# Patient Record
Sex: Female | Born: 1965
Health system: Southern US, Community
[De-identification: ages and names within clinical notes are randomized; demographics above are authoritative.]

## PROBLEM LIST (undated history)

## (undated) DIAGNOSIS — H409 Unspecified glaucoma: Secondary | ICD-10-CM

## (undated) DIAGNOSIS — E559 Vitamin D deficiency, unspecified: Secondary | ICD-10-CM

## (undated) DIAGNOSIS — Z9109 Other allergy status, other than to drugs and biological substances: Secondary | ICD-10-CM

## (undated) DIAGNOSIS — K219 Gastro-esophageal reflux disease without esophagitis: Secondary | ICD-10-CM

## (undated) DIAGNOSIS — J302 Other seasonal allergic rhinitis: Secondary | ICD-10-CM

## (undated) DIAGNOSIS — B019 Varicella without complication: Secondary | ICD-10-CM

## (undated) HISTORY — DX: Varicella without complication: B01.9

## (undated) HISTORY — PX: WISDOM TOOTH EXTRACTION: SHX21

## (undated) HISTORY — DX: Other allergy status, other than to drugs and biological substances: Z91.09

## (undated) HISTORY — DX: Unspecified glaucoma: H40.9

## (undated) HISTORY — PX: BREAST BIOPSY: SHX20

## (undated) HISTORY — DX: Other seasonal allergic rhinitis: J30.2

## (undated) HISTORY — DX: Vitamin D deficiency, unspecified: E55.9

---

## 2010-10-08 ENCOUNTER — Emergency Department (HOSPITAL_BASED_OUTPATIENT_CLINIC_OR_DEPARTMENT_OTHER)
Admission: EM | Admit: 2010-10-08 | Discharge: 2010-10-08 | Disposition: A | Discharge status: Home / Self Care | Payer: No Typology Code available for payment source | Attending: Emergency Medicine | Admitting: Emergency Medicine

## 2010-10-08 DIAGNOSIS — Y92410 Unspecified street and highway as the place of occurrence of the external cause: Secondary | ICD-10-CM | POA: Insufficient documentation

## 2010-10-08 DIAGNOSIS — IMO0001 Reserved for inherently not codable concepts without codable children: Secondary | ICD-10-CM | POA: Insufficient documentation

## 2010-10-14 ENCOUNTER — Emergency Department (HOSPITAL_BASED_OUTPATIENT_CLINIC_OR_DEPARTMENT_OTHER)
Admission: EM | Admit: 2010-10-14 | Discharge: 2010-10-14 | Disposition: A | Discharge status: Home / Self Care | Payer: No Typology Code available for payment source | Attending: Emergency Medicine | Admitting: Emergency Medicine

## 2010-10-14 DIAGNOSIS — G44209 Tension-type headache, unspecified, not intractable: Secondary | ICD-10-CM | POA: Insufficient documentation

## 2010-11-02 ENCOUNTER — Ambulatory Visit: Payer: No Typology Code available for payment source | Attending: Family Medicine | Admitting: Physical Therapy

## 2010-11-02 DIAGNOSIS — M545 Low back pain, unspecified: Secondary | ICD-10-CM | POA: Insufficient documentation

## 2010-11-02 DIAGNOSIS — M542 Cervicalgia: Secondary | ICD-10-CM | POA: Insufficient documentation

## 2010-11-02 DIAGNOSIS — IMO0001 Reserved for inherently not codable concepts without codable children: Secondary | ICD-10-CM | POA: Insufficient documentation

## 2010-11-02 DIAGNOSIS — M2569 Stiffness of other specified joint, not elsewhere classified: Secondary | ICD-10-CM | POA: Insufficient documentation

## 2010-11-07 ENCOUNTER — Ambulatory Visit: Payer: No Typology Code available for payment source | Attending: Family Medicine | Admitting: Physical Therapy

## 2010-11-07 DIAGNOSIS — IMO0001 Reserved for inherently not codable concepts without codable children: Secondary | ICD-10-CM | POA: Insufficient documentation

## 2010-11-07 DIAGNOSIS — M545 Low back pain, unspecified: Secondary | ICD-10-CM | POA: Insufficient documentation

## 2010-11-07 DIAGNOSIS — M542 Cervicalgia: Secondary | ICD-10-CM | POA: Insufficient documentation

## 2010-11-07 DIAGNOSIS — M2569 Stiffness of other specified joint, not elsewhere classified: Secondary | ICD-10-CM | POA: Insufficient documentation

## 2010-11-10 ENCOUNTER — Ambulatory Visit: Payer: No Typology Code available for payment source | Admitting: Physical Therapy

## 2010-11-16 ENCOUNTER — Ambulatory Visit: Payer: No Typology Code available for payment source | Admitting: Physical Therapy

## 2010-11-24 ENCOUNTER — Ambulatory Visit: Payer: No Typology Code available for payment source | Admitting: Physical Therapy

## 2011-05-19 ENCOUNTER — Emergency Department (HOSPITAL_BASED_OUTPATIENT_CLINIC_OR_DEPARTMENT_OTHER)
Admission: EM | Admit: 2011-05-19 | Discharge: 2011-05-19 | Disposition: A | Discharge status: Home / Self Care | Payer: BC Managed Care – PPO | Attending: Emergency Medicine | Admitting: Emergency Medicine

## 2011-05-19 ENCOUNTER — Other Ambulatory Visit: Payer: Self-pay

## 2011-05-19 ENCOUNTER — Emergency Department (INDEPENDENT_AMBULATORY_CARE_PROVIDER_SITE_OTHER): Payer: BC Managed Care – PPO

## 2011-05-19 ENCOUNTER — Emergency Department (HOSPITAL_BASED_OUTPATIENT_CLINIC_OR_DEPARTMENT_OTHER): Payer: BC Managed Care – PPO

## 2011-05-19 ENCOUNTER — Encounter: Payer: Self-pay | Admitting: *Deleted

## 2011-05-19 DIAGNOSIS — R12 Heartburn: Secondary | ICD-10-CM

## 2011-05-19 DIAGNOSIS — K219 Gastro-esophageal reflux disease without esophagitis: Secondary | ICD-10-CM | POA: Insufficient documentation

## 2011-05-19 DIAGNOSIS — R079 Chest pain, unspecified: Secondary | ICD-10-CM

## 2011-05-19 DIAGNOSIS — R112 Nausea with vomiting, unspecified: Secondary | ICD-10-CM | POA: Insufficient documentation

## 2011-05-19 HISTORY — DX: Gastro-esophageal reflux disease without esophagitis: K21.9

## 2011-05-19 LAB — COMPREHENSIVE METABOLIC PANEL
ALT: 12 U/L (ref 0–35)
AST: 21 U/L (ref 0–37)
Albumin: 4.2 g/dL (ref 3.5–5.2)
CO2: 30 mEq/L (ref 19–32)
Chloride: 100 mEq/L (ref 96–112)
Creatinine, Ser: 0.8 mg/dL (ref 0.50–1.10)
GFR calc non Af Amer: 88 mL/min — ABNORMAL LOW (ref >90)
Sodium: 139 mEq/L (ref 135–145)
Total Bilirubin: 0.8 mg/dL (ref 0.3–1.2)

## 2011-05-19 LAB — CBC
MCHC: 33.4 g/dL (ref 30.0–36.0)
Platelets: 344 10*3/uL (ref 150–400)
RDW: 13.4 % (ref 11.5–15.5)
WBC: 6.6 10*3/uL (ref 4.0–10.5)

## 2011-05-19 LAB — URINALYSIS, ROUTINE W REFLEX MICROSCOPIC
Glucose, UA: NEGATIVE mg/dL
Hgb urine dipstick: NEGATIVE
Leukocytes, UA: NEGATIVE
Protein, ur: NEGATIVE mg/dL
pH: 8 (ref 5.0–8.0)

## 2011-05-19 LAB — DIFFERENTIAL
Basophils Absolute: 0 10*3/uL (ref 0.0–0.1)
Basophils Relative: 1 % (ref 0–1)
Lymphocytes Relative: 30 % (ref 12–46)
Neutro Abs: 3.8 10*3/uL (ref 1.7–7.7)
Neutrophils Relative %: 57 % (ref 43–77)

## 2011-05-19 LAB — CK TOTAL AND CKMB (NOT AT ARMC)
CK, MB: 2.1 ng/mL (ref 0.3–4.0)
Relative Index: 1.2 (ref 0.0–2.5)

## 2011-05-19 LAB — TROPONIN I: Troponin I: <0.3 ng/mL (ref <0.30)

## 2011-05-19 MED ORDER — GI COCKTAIL ~~LOC~~
30.0000 mL | Freq: Once | ORAL | Status: AC
  Administered 2011-05-19: 30 mL via ORAL
  Filled 2011-05-19: qty 30

## 2011-05-19 MED ORDER — IOHEXOL 300 MG/ML  SOLN: 100.0000 mL | Freq: Once | INTRAMUSCULAR | Status: DC | PRN

## 2011-05-19 MED ORDER — SODIUM CHLORIDE 0.9 % IV BOLUS (SEPSIS)
1000.0000 mL | Freq: Once | INTRAVENOUS | Status: AC
  Administered 2011-05-19: 1000 mL via INTRAVENOUS

## 2011-05-19 MED ORDER — IOHEXOL 300 MG/ML  SOLN: 20.0000 mL | INTRAMUSCULAR | Status: DC

## 2011-05-19 MED ORDER — PANTOPRAZOLE SODIUM 20 MG PO TBEC: 40.0000 mg | DELAYED_RELEASE_TABLET | Freq: Every day | ORAL | Status: DC

## 2011-05-19 MED ORDER — ONDANSETRON 8 MG PO TBDP: 8.0000 mg | ORAL_TABLET | Freq: Three times a day (TID) | ORAL | Status: AC | PRN

## 2011-05-19 MED ORDER — PANTOPRAZOLE SODIUM 40 MG IV SOLR
40.0000 mg | Freq: Once | INTRAVENOUS | Status: AC
  Administered 2011-05-19: 40 mg via INTRAVENOUS
  Filled 2011-05-19: qty 40

## 2011-05-19 MED ORDER — ONDANSETRON HCL 4 MG/2ML IJ SOLN
4.0000 mg | Freq: Once | INTRAMUSCULAR | Status: AC
  Administered 2011-05-19: 4 mg via INTRAVENOUS
  Filled 2011-05-19: qty 2

## 2011-05-19 NOTE — ED Notes (Signed)
Pt c/o " heart burn" all day

## 2011-05-19 NOTE — ED Provider Notes (Signed)
History     CSN: [id number] Arrival date & time: 05/19/2011  5:55 PM   First MD Initiated Contact with Patient 05/19/11 1815      Chief Complaint  Patient presents with  . Gastrophageal Reflux  . Emesis    (Consider location/radiation/quality/duration/timing/severity/associated sxs/prior treatment) HPI Patient with nausea and vomiting x 4 today.  Patient without fever.  She states she has had reflux and acid in back of throat.  She has had similar episodes before.  She began with symptoms this am when she didn't eat.  Burning in chest is mild at same time as can taste acid in throat with frequent burping.  Patient with known history of gerd by her doctor at [facility] family [facility] 1 1/2 year ago.  Patient states symptoms had been better and quit taking her prilosec.  Patient had headache after symptoms started and took asa with relief.    Past Medical History  Diagnosis Date  . GERD (gastroesophageal reflux disease)     Past Surgical History  Procedure Date  . Cesarean section   . Abdominal hysterectomy     History reviewed. No pertinent family history.  History  Substance Use Topics  . Smoking status: Not on file  . Smokeless tobacco: Not on file  . Alcohol Use: No    OB History    Grav Para Term Preterm Abortions TAB SAB Ect Mult Living                  Review of Systems  All other systems reviewed and are negative.    Allergies  Codeine and Prednisone  Home Medications   Current Outpatient Rx  Name Route Sig Dispense Refill  . ASPIRIN EC 81 MG PO TBEC Oral Take 81 mg by mouth once.      Marland Kitchen CALCIUM CARBONATE ANTACID 500 MG PO CHEW Oral Chew 2 tablets by mouth once.      . OMEGA-3 FATTY ACIDS 1000 MG PO CAPS Oral Take 1 g by mouth every other day.      Marland Kitchen LORATADINE 10 MG PO TABS Oral Take 10 mg by mouth daily.      Marland Kitchen MAGNESIUM HYDROXIDE 800 MG/5ML PO SUSP Oral Take 30 mLs by mouth once.      Marland Kitchen ONE-DAILY MULTI VITAMINS PO TABS Oral Take 1 tablet by mouth daily.       Marland Kitchen VITAMIN B-12 1000 MCG PO TABS Oral Take 1,000 mcg by mouth daily.        BP 156/93  Pulse 96  Temp(Src) 98.3 F (36.8 C) (Oral)  Resp 16  Ht 5\' 7"  (1.702 m)  Wt 120 lb (54.432 kg)  BMI 18.79 kg/m2  SpO2 97%  Physical Exam  Nursing note and vitals reviewed. Constitutional: She is oriented to person, place, and time. She appears well-developed and well-nourished.  HENT:  Head: Normocephalic and atraumatic.  Eyes: Conjunctivae and EOM are normal. Pupils are equal, round, and reactive to light.  Neck: Normal range of motion. Neck supple.  Cardiovascular: Normal rate.   Pulmonary/Chest: Effort normal and breath sounds normal.  Abdominal: Soft. Bowel sounds are normal.  Musculoskeletal: Normal range of motion.  Neurological: She is alert and oriented to person, place, and time. She has normal reflexes.  Skin: Skin is warm.  Psychiatric: She has a normal mood and affect. Her behavior is normal. Judgment and thought content normal.    ED Course  Procedures (including critical care time)  Labs Reviewed - No data to  display No results found.   No diagnosis found.  Results for orders placed during the hospital encounter of 05/19/11  CBC      Component Value Range   WBC 6.6  4.0 - 10.5 (K/uL)   RBC 4.70  3.87 - 5.11 (MIL/uL)   Hemoglobin 13.4  12.0 - 15.0 (g/dL)   HCT 13.0  86.5 - 78.4 (%)   MCV 85.3  78.0 - 100.0 (fL)   MCH 28.5  26.0 - 34.0 (pg)   MCHC 33.4  30.0 - 36.0 (g/dL)   RDW 69.6  29.5 - 28.4 (%)   Platelets 344  150 - 400 (K/uL)  DIFFERENTIAL      Component Value Range   Neutrophils Relative 57  43 - 77 (%)   Neutro Abs 3.8  1.7 - 7.7 (K/uL)   Lymphocytes Relative 30  12 - 46 (%)   Lymphs Abs 2.0  0.7 - 4.0 (K/uL)   Monocytes Relative 9  3 - 12 (%)   Monocytes Absolute 0.6  0.1 - 1.0 (K/uL)   Eosinophils Relative 4  0 - 5 (%)   Eosinophils Absolute 0.3  0.0 - 0.7 (K/uL)   Basophils Relative 1  0 - 1 (%)   Basophils Absolute 0.0  0.0 - 0.1 (K/uL)    COMPREHENSIVE METABOLIC PANEL      Component Value Range   Sodium 139  135 - 145 (mEq/L)   Potassium 4.0  3.5 - 5.1 (mEq/L)   Chloride 100  96 - 112 (mEq/L)   CO2 30  19 - 32 (mEq/L)   Glucose, Bld 91  70 - 99 (mg/dL)   BUN 10  6 - 23 (mg/dL)   Creatinine, Ser 1.32  0.50 - 1.10 (mg/dL)   Calcium 44.0  8.4 - 10.5 (mg/dL)   Total Protein 7.9  6.0 - 8.3 (g/dL)   Albumin 4.2  3.5 - 5.2 (g/dL)   AST 21  0 - 37 (U/L)   ALT 12  0 - 35 (U/L)   Alkaline Phosphatase 93  39 - 117 (U/L)   Total Bilirubin 0.8  0.3 - 1.2 (mg/dL)   GFR calc non Af Amer 88 (*) >90 (mL/min)   GFR calc Af Amer >90  >90 (mL/min)  LIPASE, BLOOD      Component Value Range   Lipase 18  11 - 59 (U/L)  URINALYSIS, ROUTINE W REFLEX MICROSCOPIC      Component Value Range   Color, Urine YELLOW  YELLOW    APPearance CLOUDY (*) CLEAR    Specific Gravity, Urine 1.015  1.005 - 1.030    pH 8.0  5.0 - 8.0    Glucose, UA NEGATIVE  NEGATIVE (mg/dL)   Hgb urine dipstick NEGATIVE  NEGATIVE    Bilirubin Urine NEGATIVE  NEGATIVE    Ketones, ur NEGATIVE  NEGATIVE (mg/dL)   Protein, ur NEGATIVE  NEGATIVE (mg/dL)   Urobilinogen, UA 0.2  0.0 - 1.0 (mg/dL)   Nitrite NEGATIVE  NEGATIVE    Leukocytes, UA NEGATIVE  NEGATIVE   TROPONIN I      Component Value Range   Troponin I <0.30  <0.30 (ng/mL)  CK TOTAL AND CKMB      Component Value Range   Total CK 174  7 - 177 (U/L)   CK, MB 2.1  0.3 - 4.0 (ng/mL)   Relative Index 1.2  0.0 - 2.5      MDM    Date: 05/19/2011  Rate: 77  Rhythm: normal  sinus rhythm  QRS Axis: normal  Intervals: normal  ST/T Wave abnormalities: normal  Conduction Disutrbances:none  Narrative Interpretation:   Old EKG Reviewed: none available   Labs reviewed and normal.  Patient states she feels improved.  CT abd originally ordered but patient did not want to have- risks and benefits discussed and patient declined.  Patient states she is now having diarrhea.  She is encouraged to return if worse at any  time.        Hilario Quarry, MD 05/19/11 1610  Hilario Quarry, MD 05/19/11 2027

## 2012-06-05 HISTORY — PX: ABDOMINAL HYSTERECTOMY: SHX81

## 2013-06-05 LAB — HM MAMMOGRAPHY

## 2013-09-18 ENCOUNTER — Encounter: Payer: Self-pay | Admitting: Physician Assistant

## 2013-09-18 ENCOUNTER — Ambulatory Visit (INDEPENDENT_AMBULATORY_CARE_PROVIDER_SITE_OTHER): Payer: BC Managed Care – PPO | Admitting: Physician Assistant

## 2013-09-18 VITALS — BP 118/72 | HR 94 | Temp 97.7°F | Resp 16 | Ht 62.0 in | Wt 146.2 lb

## 2013-09-18 DIAGNOSIS — M62838 Other muscle spasm: Secondary | ICD-10-CM

## 2013-09-18 DIAGNOSIS — K219 Gastro-esophageal reflux disease without esophagitis: Secondary | ICD-10-CM

## 2013-09-18 DIAGNOSIS — E559 Vitamin D deficiency, unspecified: Secondary | ICD-10-CM

## 2013-09-18 DIAGNOSIS — Z0000 Encounter for general adult medical examination without abnormal findings: Secondary | ICD-10-CM

## 2013-09-18 LAB — TSH: TSH: 1.112 u[IU]/mL (ref 0.350–4.500)

## 2013-09-18 LAB — HEPATIC FUNCTION PANEL
ALBUMIN: 4.5 g/dL (ref 3.5–5.2)
ALT: 16 U/L (ref 0–35)
AST: 16 U/L (ref 0–37)
Alkaline Phosphatase: 102 U/L (ref 39–117)
Bilirubin, Direct: 0.1 mg/dL (ref 0.0–0.3)
Indirect Bilirubin: 0.4 mg/dL (ref 0.2–1.2)
TOTAL PROTEIN: 7.8 g/dL (ref 6.0–8.3)
Total Bilirubin: 0.5 mg/dL (ref 0.2–1.2)

## 2013-09-18 LAB — LIPID PANEL
Cholesterol: 245 mg/dL — ABNORMAL HIGH (ref 0–200)
HDL: 56 mg/dL (ref >39)
LDL Cholesterol: 172 mg/dL — ABNORMAL HIGH (ref 0–99)
Total CHOL/HDL Ratio: 4.4 Ratio
Triglycerides: 85 mg/dL (ref <150)
VLDL: 17 mg/dL (ref 0–40)

## 2013-09-18 LAB — BASIC METABOLIC PANEL
BUN: 11 mg/dL (ref 6–23)
CALCIUM: 9.5 mg/dL (ref 8.4–10.5)
CO2: 29 meq/L (ref 19–32)
CREATININE: 0.73 mg/dL (ref 0.50–1.10)
Chloride: 102 mEq/L (ref 96–112)
GLUCOSE: 79 mg/dL (ref 70–99)
Potassium: 3.7 mEq/L (ref 3.5–5.3)
Sodium: 138 mEq/L (ref 135–145)

## 2013-09-18 LAB — CBC
HCT: 37.6 % (ref 36.0–46.0)
Hemoglobin: 12.5 g/dL (ref 12.0–15.0)
MCH: 28.6 pg (ref 26.0–34.0)
MCHC: 33.2 g/dL (ref 30.0–36.0)
MCV: 86 fL (ref 78.0–100.0)
Platelets: 426 10*3/uL — ABNORMAL HIGH (ref 150–400)
RBC: 4.37 MIL/uL (ref 3.87–5.11)
RDW: 13.4 % (ref 11.5–15.5)
WBC: 9 10*3/uL (ref 4.0–10.5)

## 2013-09-18 LAB — HEMOGLOBIN A1C
HEMOGLOBIN A1C: 5.7 % — AB (ref <5.7)
MEAN PLASMA GLUCOSE: 117 mg/dL — AB (ref <117)

## 2013-09-18 MED ORDER — CYCLOBENZAPRINE HCL 10 MG PO TABS: 10.0000 mg | ORAL_TABLET | Freq: Three times a day (TID) | ORAL | Status: DC | PRN

## 2013-09-18 NOTE — Patient Instructions (Addendum)
Please obtain labs. I will call you with your results.  Increase fluid intake.  Take a fiber supplement.  For allergies -- continue Claritin daily.  Start Flonase daily.  For spasms -- Alternate between tylenol and ibuprofen for pain.  Take Flexeril at bedtime.  You can take 1/2 tablet during the day, but do not operate a vehicle.  If symptoms not improving, we will need imaging and referral to a specialist.    Preventive Care for Adults, Female A healthy lifestyle and preventive care can promote health and wellness. Preventive health guidelines for women include the following key practices.  A routine yearly physical is a good way to check with your health care provider about your health and preventive screening. It is a chance to share any concerns and updates on your health and to receive a thorough exam.  Visit your dentist for a routine exam and preventive care every 6 months. Brush your teeth twice a day and floss once a day. Good oral hygiene prevents tooth decay and gum disease.  The frequency of eye exams is based on your age, health, family medical history, use of contact lenses, and other factors. Follow your health care provider's recommendations for frequency of eye exams.  Eat a healthy diet. Foods like vegetables, fruits, whole grains, low-fat dairy products, and lean protein foods contain the nutrients you need without too many calories. Decrease your intake of foods high in solid fats, added sugars, and salt. Eat the right amount of calories for you.Get information about a proper diet from your health care provider, if necessary.  Regular physical exercise is one of the most important things you can do for your health. Most adults should get at least 150 minutes of moderate-intensity exercise (any activity that increases your heart rate and causes you to sweat) each week. In addition, most adults need muscle-strengthening exercises on 2 or more days a week.  Maintain a healthy  weight. The body mass index (BMI) is a screening tool to identify possible weight problems. It provides an estimate of body fat based on height and weight. Your health care provider can find your BMI, and can help you achieve or maintain a healthy weight.For adults 20 years and older:  A BMI below 18.5 is considered underweight.  A BMI of 18.5 to 24.9 is normal.  A BMI of 25 to 29.9 is considered overweight.  A BMI of 30 and above is considered obese.  Maintain normal blood lipids and cholesterol levels by exercising and minimizing your intake of saturated fat. Eat a balanced diet with plenty of fruit and vegetables. Blood tests for lipids and cholesterol should begin at age 21 and be repeated every 5 years. If your lipid or cholesterol levels are high, you are over 50, or you are at high risk for heart disease, you may need your cholesterol levels checked more frequently.Ongoing high lipid and cholesterol levels should be treated with medicines if diet and exercise are not working.  If you smoke, find out from your health care provider how to quit. If you do not use tobacco, do not start.  Lung cancer screening is recommended for adults aged 65 80 years who are at high risk for developing lung cancer because of a history of smoking. A yearly low-dose CT scan of the lungs is recommended for people who have at least a 30-pack-year history of smoking and are a current smoker or have quit within the past 15 years. A pack year of smoking  is smoking an average of 1 pack of cigarettes a day for 1 year (for example: 1 pack a day for 30 years or 2 packs a day for 15 years). Yearly screening should continue until the smoker has stopped smoking for at least 15 years. Yearly screening should be stopped for people who develop a health problem that would prevent them from having lung cancer treatment.  If you are pregnant, do not drink alcohol. If you are breastfeeding, be very cautious about drinking alcohol.  If you are not pregnant and choose to drink alcohol, do not have more than 1 drink per day. One drink is considered to be 12 ounces (355 mL) of beer, 5 ounces (148 mL) of wine, or 1.5 ounces (44 mL) of liquor.  Avoid use of street drugs. Do not share needles with anyone. Ask for help if you need support or instructions about stopping the use of drugs.  High blood pressure causes heart disease and increases the risk of stroke. Your blood pressure should be checked at least every 1 to 2 years. Ongoing high blood pressure should be treated with medicines if weight loss and exercise do not work.  If you are 3 47 years old, ask your health care provider if you should take aspirin to prevent strokes.  Diabetes screening involves taking a blood sample to check your fasting blood sugar level. This should be done once every 3 years, after age 61, if you are within normal weight and without risk factors for diabetes. Testing should be considered at a younger age or be carried out more frequently if you are overweight and have at least 1 risk factor for diabetes.  Breast cancer screening is essential preventive care for women. You should practice "breast self-awareness." This means understanding the normal appearance and feel of your breasts and may include breast self-examination. Any changes detected, no matter how small, should be reported to a health care provider. Women in their 38s and 30s should have a clinical breast exam (CBE) by a health care provider as part of a regular health exam every 1 to 3 years. After age 42, women should have a CBE every year. Starting at age 32, women should consider having a mammogram (breast X-ray test) every year. Women who have a family history of breast cancer should talk to their health care provider about genetic screening. Women at a high risk of breast cancer should talk to their health care providers about having an MRI and a mammogram every year.  Breast cancer gene  (BRCA)-related cancer risk assessment is recommended for women who have family members with BRCA-related cancers. BRCA-related cancers include breast, ovarian, tubal, and peritoneal cancers. Having family members with these cancers may be associated with an increased risk for harmful changes (mutations) in the breast cancer genes BRCA1 and BRCA2. Results of the assessment will determine the need for genetic counseling and BRCA1 and BRCA2 testing.  The Pap test is a screening test for cervical cancer. A Pap test can show cell changes on the cervix that might become cervical cancer if left untreated. A Pap test is a procedure in which cells are obtained and examined from the lower end of the uterus (cervix).  Women should have a Pap test starting at age 19.  Between ages 67 and 16, Pap tests should be repeated every 2 years.  Beginning at age 73, you should have a Pap test every 3 years as long as the past 3 Pap tests have been  normal.  Some women have medical problems that increase the chance of getting cervical cancer. Talk to your health care provider about these problems. It is especially important to talk to your health care provider if a new problem develops soon after your last Pap test. In these cases, your health care provider may recommend more frequent screening and Pap tests.  The above recommendations are the same for women who have or have not gotten the vaccine for human papillomavirus (HPV).  If you had a hysterectomy for a problem that was not cancer or a condition that could lead to cancer, then you no longer need Pap tests. Even if you no longer need a Pap test, a regular exam is a good idea to make sure no other problems are starting.  If you are between ages 29 and 39 years, and you have had normal Pap tests going back 10 years, you no longer need Pap tests. Even if you no longer need a Pap test, a regular exam is a good idea to make sure no other problems are starting.  If you  have had past treatment for cervical cancer or a condition that could lead to cancer, you need Pap tests and screening for cancer for at least 20 years after your treatment.  If Pap tests have been discontinued, risk factors (such as a new sexual partner) need to be reassessed to determine if screening should be resumed.  The HPV test is an additional test that may be used for cervical cancer screening. The HPV test looks for the virus that can cause the cell changes on the cervix. The cells collected during the Pap test can be tested for HPV. The HPV test could be used to screen women aged 76 years and older, and should be used in women of any age who have unclear Pap test results. After the age of 4, women should have HPV testing at the same frequency as a Pap test.  Colorectal cancer can be detected and often prevented. Most routine colorectal cancer screening begins at the age of 79 years and continues through age 67 years. However, your health care provider may recommend screening at an earlier age if you have risk factors for colon cancer. On a yearly basis, your health care provider may provide home test kits to check for hidden blood in the stool. Use of a small camera at the end of a tube, to directly examine the colon (sigmoidoscopy or colonoscopy), can detect the earliest forms of colorectal cancer. Talk to your health care provider about this at age 58, when routine screening begins. Direct exam of the colon should be repeated every 5 10 years through age 74 years, unless early forms of pre-cancerous polyps or small growths are found.  People who are at an increased risk for hepatitis B should be screened for this virus. You are considered at high risk for hepatitis B if:  You were born in a country where hepatitis B occurs often. Talk with your health care provider about which countries are considered high risk.  Your parents were born in a high-risk country and you have not received a  shot to protect against hepatitis B (hepatitis B vaccine).  You have HIV or AIDS.  You use needles to inject street drugs.  You live with, or have sex with, someone who has Hepatitis B.  You get hemodialysis treatment.  You take certain medicines for conditions like cancer, organ transplantation, and autoimmune conditions.  Hepatitis C  blood testing is recommended for all people born from [birth year] through 1965 and any individual with known risks for hepatitis C.  Practice safe sex. Use condoms and avoid high-risk sexual practices to reduce the spread of sexually transmitted infections (STIs). STIs include gonorrhea, chlamydia, syphilis, trichomonas, herpes, HPV, and human immunodeficiency virus (HIV). Herpes, HIV, and HPV are viral illnesses that have no cure. They can result in disability, cancer, and death. Sexually active women aged 66 years and younger should be checked for chlamydia. Older women with new or multiple partners should also be tested for chlamydia. Testing for other STIs is recommended if you are sexually active and at increased risk.  Osteoporosis is a disease in which the bones lose minerals and strength with aging. This can result in serious bone fractures or breaks. The risk of osteoporosis can be identified using a bone density scan. Women ages 31 years and over and women at risk for fractures or osteoporosis should discuss screening with their health care providers. Ask your health care provider whether you should take a calcium supplement or vitamin D to reduce the rate of osteoporosis.  Menopause can be associated with physical symptoms and risks. Hormone replacement therapy is available to decrease symptoms and risks. You should talk to your health care provider about whether hormone replacement therapy is right for you.  Use sunscreen. Apply sunscreen liberally and repeatedly throughout the day. You should seek shade when your shadow is shorter than you. Protect yourself  by wearing long sleeves, pants, a wide-brimmed hat, and sunglasses year round, whenever you are outdoors.  Once a month, do a whole body skin exam, using a mirror to look at the skin on your back. Tell your health care provider of new moles, moles that have irregular borders, moles that are larger than a pencil eraser, or moles that have changed in shape or color.  Stay current with required vaccines (immunizations).  Influenza vaccine. All adults should be immunized every year.  Tetanus, diphtheria, and acellular pertussis (Td, Tdap) vaccine. Pregnant women should receive 1 dose of Tdap vaccine during each pregnancy. The dose should be obtained regardless of the length of time since the last dose. Immunization is preferred during the 27th 36th week of gestation. An adult who has not previously received Tdap or who does not know her vaccine status should receive 1 dose of Tdap. This initial dose should be followed by tetanus and diphtheria toxoids (Td) booster doses every 10 years. Adults with an unknown or incomplete history of completing a 3-dose immunization series with Td-containing vaccines should begin or complete a primary immunization series including a Tdap dose. Adults should receive a Td booster every 10 years.  Varicella vaccine. An adult without evidence of immunity to varicella should receive 2 doses or a second dose if she has previously received 1 dose. Pregnant females who do not have evidence of immunity should receive the first dose after pregnancy. This first dose should be obtained before leaving the health care facility. The second dose should be obtained 4 8 weeks after the first dose.  Human papillomavirus (HPV) vaccine. Females aged 18 26 years who have not received the vaccine previously should obtain the 3-dose series. The vaccine is not recommended for use in pregnant females. However, pregnancy testing is not needed before receiving a dose. If a female is found to be pregnant  after receiving a dose, no treatment is needed. In that case, the remaining doses should be delayed until after the pregnancy.  Immunization is recommended for any person with an immunocompromised condition through the age of 58 years if she did not get any or all doses earlier. During the 3-dose series, the second dose should be obtained 4 8 weeks after the first dose. The third dose should be obtained 24 weeks after the first dose and 16 weeks after the second dose.  Zoster vaccine. One dose is recommended for adults aged 65 years or older unless certain conditions are present.  Measles, mumps, and rubella (MMR) vaccine. Adults born before [birth year] generally are considered immune to measles and mumps. Adults born in [birth year] or later should have 1 or more doses of MMR vaccine unless there is a contraindication to the vaccine or there is laboratory evidence of immunity to each of the three diseases. A routine second dose of MMR vaccine should be obtained at least 28 days after the first dose for students attending postsecondary schools, health care workers, or international travelers. People who received inactivated measles vaccine or an unknown type of measles vaccine during 1963 1967 should receive 2 doses of MMR vaccine. People who received inactivated mumps vaccine or an unknown type of mumps vaccine before 1979 and are at high risk for mumps infection should consider immunization with 2 doses of MMR vaccine. For females of childbearing age, rubella immunity should be determined. If there is no evidence of immunity, females who are not pregnant should be vaccinated. If there is no evidence of immunity, females who are pregnant should delay immunization until after pregnancy. Unvaccinated health care workers born before 50 who lack laboratory evidence of measles, mumps, or rubella immunity or laboratory confirmation of disease should consider measles and mumps immunization with 2 doses of MMR vaccine or rubella  immunization with 1 dose of MMR vaccine.  Pneumococcal 13-valent conjugate (PCV13) vaccine. When indicated, a person who is uncertain of her immunization history and has no record of immunization should receive the PCV13 vaccine. An adult aged 56 years or older who has certain medical conditions and has not been previously immunized should receive 1 dose of PCV13 vaccine. This PCV13 should be followed with a dose of pneumococcal polysaccharide (PPSV23) vaccine. The PPSV23 vaccine dose should be obtained at least 8 weeks after the dose of PCV13 vaccine. An adult aged 76 years or older who has certain medical conditions and previously received 1 or more doses of PPSV23 vaccine should receive 1 dose of PCV13. The PCV13 vaccine dose should be obtained 1 or more years after the last PPSV23 vaccine dose.  Pneumococcal polysaccharide (PPSV23) vaccine. When PCV13 is also indicated, PCV13 should be obtained first. All adults aged 99 years and older should be immunized. An adult younger than age 10 years who has certain medical conditions should be immunized. Any person who resides in a nursing home or long-term care facility should be immunized. An adult smoker should be immunized. People with an immunocompromised condition and certain other conditions should receive both PCV13 and PPSV23 vaccines. People with human immunodeficiency virus (HIV) infection should be immunized as soon as possible after diagnosis. Immunization during chemotherapy or radiation therapy should be avoided. Routine use of PPSV23 vaccine is not recommended for American Indians, [place] Natives, or people younger than 65 years unless there are medical conditions that require PPSV23 vaccine. When indicated, people who have unknown immunization and have no record of immunization should receive PPSV23 vaccine. One-time revaccination 5 years after the first dose of PPSV23 is recommended for people aged 32 64 years who  have chronic kidney failure,  nephrotic syndrome, asplenia, or immunocompromised conditions. People who received 1 2 doses of PPSV23 before age 9 years should receive another dose of PPSV23 vaccine at age 79 years or later if at least 5 years have passed since the previous dose. Doses of PPSV23 are not needed for people immunized with PPSV23 at or after age 49 years.  Meningococcal vaccine. Adults with asplenia or persistent complement component deficiencies should receive 2 doses of quadrivalent meningococcal conjugate (MenACWY-D) vaccine. The doses should be obtained at least 2 months apart. Microbiologists working with certain meningococcal bacteria, [employer] recruits, people at risk during an outbreak, and people who travel to or live in countries with a high rate of meningitis should be immunized. A first-year college student up through age 53 years who is living in a residence hall should receive a dose if she did not receive a dose on or after her 16th birthday. Adults who have certain high-risk conditions should receive one or more doses of vaccine.  Hepatitis A vaccine. Adults who wish to be protected from this disease, have certain high-risk conditions, work with hepatitis A-infected animals, work in hepatitis A research labs, or travel to or work in countries with a high rate of hepatitis A should be immunized. Adults who were previously unvaccinated and who anticipate close contact with an international adoptee during the first 60 days after arrival in the [place] from a country with a high rate of hepatitis A should be immunized.  Hepatitis B vaccine. Adults who wish to be protected from this disease, have certain high-risk conditions, may be exposed to blood or other infectious body fluids, are household contacts or sex partners of hepatitis B positive people, are clients or workers in certain care facilities, or travel to or work in countries with a high rate of hepatitis B should be immunized.  Haemophilus  influenzae type b (Hib) vaccine. A previously unvaccinated person with asplenia or sickle cell disease or having a scheduled splenectomy should receive 1 dose of Hib vaccine. Regardless of previous immunization, a recipient of a hematopoietic stem cell transplant should receive a 3-dose series 6 12 months after her successful transplant. Hib vaccine is not recommended for adults with HIV infection. Preventive Services / Frequency Ages 52 to 39years  Blood pressure check.** / Every 1 to 2 years.  Lipid and cholesterol check.** / Every 5 years beginning at age 22.  Clinical breast exam.** / Every 3 years for women in their 38s and 35s.  BRCA-related cancer risk assessment.** / For women who have family members with a BRCA-related cancer (breast, ovarian, tubal, or peritoneal cancers).  Pap test.** / Every 2 years from ages 64 through 107. Every 3 years starting at age 42 through age 63 or 62 with a history of 3 consecutive normal Pap tests.  HPV screening.** / Every 3 years from ages 89 through ages 38 to 24 with a history of 3 consecutive normal Pap tests.  Hepatitis C blood test.** / For any individual with known risks for hepatitis C.  Skin self-exam. / Monthly.  Influenza vaccine. / Every year.  Tetanus, diphtheria, and acellular pertussis (Tdap, Td) vaccine.** / Consult your health care provider. Pregnant women should receive 1 dose of Tdap vaccine during each pregnancy. 1 dose of Td every 10 years.  Varicella vaccine.** / Consult your health care provider. Pregnant females who do not have evidence of immunity should receive the first dose after pregnancy.  HPV vaccine. / 3 doses  over 6 months, if 34 and younger. The vaccine is not recommended for use in pregnant females. However, pregnancy testing is not needed before receiving a dose.  Measles, mumps, rubella (MMR) vaccine.** / You need at least 1 dose of MMR if you were born in 1957 or later. You may also need a 2nd dose. For females  of childbearing age, rubella immunity should be determined. If there is no evidence of immunity, females who are not pregnant should be vaccinated. If there is no evidence of immunity, females who are pregnant should delay immunization until after pregnancy.  Pneumococcal 13-valent conjugate (PCV13) vaccine.** / Consult your health care provider.  Pneumococcal polysaccharide (PPSV23) vaccine.** / 1 to 2 doses if you smoke cigarettes or if you have certain conditions.  Meningococcal vaccine.** / 1 dose if you are age 69 to 10 years and a Market researcher living in a residence hall, or have one of several medical conditions, you need to get vaccinated against meningococcal disease. You may also need additional booster doses.  Hepatitis A vaccine.** / Consult your health care provider.  Hepatitis B vaccine.** / Consult your health care provider.  Haemophilus influenzae type b (Hib) vaccine.** / Consult your health care provider. Ages 54 to 64years  Blood pressure check.** / Every 1 to 2 years.  Lipid and cholesterol check.** / Every 5 years beginning at age 6 years.  Lung cancer screening. / Every year if you are aged 41 80 years and have a 30-pack-year history of smoking and currently smoke or have quit within the past 15 years. Yearly screening is stopped once you have quit smoking for at least 15 years or develop a health problem that would prevent you from having lung cancer treatment.  Clinical breast exam.** / Every year after age 61 years.  BRCA-related cancer risk assessment.** / For women who have family members with a BRCA-related cancer (breast, ovarian, tubal, or peritoneal cancers).  Mammogram.** / Every year beginning at age 61 years and continuing for as long as you are in good health. Consult with your health care provider.  Pap test.** / Every 3 years starting at age 58 years through age 55 or 33 years with a history of 3 consecutive normal Pap tests.  HPV  screening.** / Every 3 years from ages 44 years through ages 49 to 76 years with a history of 3 consecutive normal Pap tests.  Fecal occult blood test (FOBT) of stool. / Every year beginning at age 62 years and continuing until age 29 years. You may not need to do this test if you get a colonoscopy every 10 years.  Flexible sigmoidoscopy or colonoscopy.** / Every 5 years for a flexible sigmoidoscopy or every 10 years for a colonoscopy beginning at age 22 years and continuing until age 57 years.  Hepatitis C blood test.** / For all people born from [birth year] through 1964 and any individual with known risks for hepatitis C.  Skin self-exam. / Monthly.  Influenza vaccine. / Every year.  Tetanus, diphtheria, and acellular pertussis (Tdap/Td) vaccine.** / Consult your health care provider. Pregnant women should receive 1 dose of Tdap vaccine during each pregnancy. 1 dose of Td every 10 years.  Varicella vaccine.** / Consult your health care provider. Pregnant females who do not have evidence of immunity should receive the first dose after pregnancy.  Zoster vaccine.** / 1 dose for adults aged 28 years or older.  Measles, mumps, rubella (MMR) vaccine.** / You need at least 1 dose  of MMR if you were born in 1957 or later. You may also need a 2nd dose. For females of childbearing age, rubella immunity should be determined. If there is no evidence of immunity, females who are not pregnant should be vaccinated. If there is no evidence of immunity, females who are pregnant should delay immunization until after pregnancy.  Pneumococcal 13-valent conjugate (PCV13) vaccine.** / Consult your health care provider.  Pneumococcal polysaccharide (PPSV23) vaccine.** / 1 to 2 doses if you smoke cigarettes or if you have certain conditions.  Meningococcal vaccine.** / Consult your health care provider.  Hepatitis A vaccine.** / Consult your health care provider.  Hepatitis B vaccine.** / Consult your health care  provider.  Haemophilus influenzae type b (Hib) vaccine.** / Consult your health care provider. Ages 66 years and over  Blood pressure check.** / Every 1 to 2 years.  Lipid and cholesterol check.** / Every 5 years beginning at age 72 years.  Lung cancer screening. / Every year if you are aged 88 80 years and have a 30-pack-year history of smoking and currently smoke or have quit within the past 15 years. Yearly screening is stopped once you have quit smoking for at least 15 years or develop a health problem that would prevent you from having lung cancer treatment.  Clinical breast exam.** / Every year after age 72 years.  BRCA-related cancer risk assessment.** / For women who have family members with a BRCA-related cancer (breast, ovarian, tubal, or peritoneal cancers).  Mammogram.** / Every year beginning at age 22 years and continuing for as long as you are in good health. Consult with your health care provider.  Pap test.** / Every 3 years starting at age 20 years through age 74 or 57 years with 3 consecutive normal Pap tests. Testing can be stopped between 65 and 70 years with 3 consecutive normal Pap tests and no abnormal Pap or HPV tests in the past 10 years.  HPV screening.** / Every 3 years from ages 38 years through ages 41 or 25 years with a history of 3 consecutive normal Pap tests. Testing can be stopped between 65 and 70 years with 3 consecutive normal Pap tests and no abnormal Pap or HPV tests in the past 10 years.  Fecal occult blood test (FOBT) of stool. / Every year beginning at age 52 years and continuing until age 80 years. You may not need to do this test if you get a colonoscopy every 10 years.  Flexible sigmoidoscopy or colonoscopy.** / Every 5 years for a flexible sigmoidoscopy or every 10 years for a colonoscopy beginning at age 37 years and continuing until age 25 years.  Hepatitis C blood test.** / For all people born from [birth year] through 1964 and any individual with  known risks for hepatitis C.  Osteoporosis screening.** / A one-time screening for women ages 69 years and over and women at risk for fractures or osteoporosis.  Skin self-exam. / Monthly.  Influenza vaccine. / Every year.  Tetanus, diphtheria, and acellular pertussis (Tdap/Td) vaccine.** / 1 dose of Td every 10 years.  Varicella vaccine.** / Consult your health care provider.  Zoster vaccine.** / 1 dose for adults aged 71 years or older.  Pneumococcal 13-valent conjugate (PCV13) vaccine.** / Consult your health care provider.  Pneumococcal polysaccharide (PPSV23) vaccine.** / 1 dose for all adults aged 33 years and older.  Meningococcal vaccine.** / Consult your health care provider.  Hepatitis A vaccine.** / Consult your health care provider.  Hepatitis  B vaccine.** / Consult your health care provider.  Haemophilus influenzae type b (Hib) vaccine.** / Consult your health care provider. ** Family history and personal history of risk and conditions may change your health care provider's recommendations. Document Released: 07/18/2001 Document Revised: 03/12/2013 Document Reviewed: 10/17/2010 [facility] Patient Information 2014 [place], [place].
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
## 2013-09-18 NOTE — Progress Notes (Signed)
Pre visit review using our clinic review tool, if applicable. No additional management support is needed unless otherwise documented below in the visit note/SLS  

## 2013-09-19 ENCOUNTER — Other Ambulatory Visit: Payer: Self-pay | Admitting: Physician Assistant

## 2013-09-19 DIAGNOSIS — Z0000 Encounter for general adult medical examination without abnormal findings: Secondary | ICD-10-CM | POA: Insufficient documentation

## 2013-09-19 DIAGNOSIS — K219 Gastro-esophageal reflux disease without esophagitis: Secondary | ICD-10-CM | POA: Insufficient documentation

## 2013-09-19 DIAGNOSIS — E559 Vitamin D deficiency, unspecified: Secondary | ICD-10-CM | POA: Insufficient documentation

## 2013-09-19 DIAGNOSIS — M62838 Other muscle spasm: Secondary | ICD-10-CM | POA: Insufficient documentation

## 2013-09-19 LAB — URINALYSIS, ROUTINE W REFLEX MICROSCOPIC
BILIRUBIN URINE: NEGATIVE
Glucose, UA: NEGATIVE mg/dL
HGB URINE DIPSTICK: NEGATIVE
KETONES UR: NEGATIVE mg/dL
Leukocytes, UA: NEGATIVE
NITRITE: NEGATIVE
Protein, ur: NEGATIVE mg/dL
Specific Gravity, Urine: 1.023 (ref 1.005–1.030)
UROBILINOGEN UA: 1 mg/dL (ref 0.0–1.0)
pH: 5.5 (ref 5.0–8.0)

## 2013-09-19 MED ORDER — PANTOPRAZOLE SODIUM 20 MG PO TBEC: 40.0000 mg | DELAYED_RELEASE_TABLET | Freq: Every day | ORAL | Status: DC

## 2013-09-19 MED ORDER — PANTOPRAZOLE SODIUM 40 MG PO TBEC: 40.0000 mg | DELAYED_RELEASE_TABLET | Freq: Every day | ORAL | Status: DC

## 2013-09-19 NOTE — Assessment & Plan Note (Signed)
Protonix refilled

## 2013-09-19 NOTE — Assessment & Plan Note (Signed)
Rx Flexeril.  Alternate between Tylenol and Ibuprofen for pain.  Avoid heavy lifting.  Consider letting us obtain imaging and possibly sending you to PT.

## 2013-09-19 NOTE — Assessment & Plan Note (Signed)
History reviewed.  Health Maintenance UTD.  Patient followed by OB/GYN.  Will obtain fasting labs.

## 2013-09-19 NOTE — Assessment & Plan Note (Signed)
Recheck vitamin D levels 

## 2013-09-22 ENCOUNTER — Encounter: Payer: Self-pay | Admitting: *Deleted

## 2013-09-24 LAB — VITAMIN D 1,25 DIHYDROXY
VITAMIN D 1, 25 (OH) TOTAL: 73 pg/mL — AB (ref 18–72)
Vitamin D3 1, 25 (OH)2: 73 pg/mL

## 2013-09-26 ENCOUNTER — Encounter: Payer: Self-pay | Admitting: *Deleted

## 2013-12-03 LAB — HM PAP SMEAR

## 2014-01-29 ENCOUNTER — Ambulatory Visit: Payer: BC Managed Care – PPO | Admitting: Physician Assistant

## 2014-01-29 ENCOUNTER — Telehealth: Payer: Self-pay

## 2014-01-29 ENCOUNTER — Ambulatory Visit (INDEPENDENT_AMBULATORY_CARE_PROVIDER_SITE_OTHER): Payer: BC Managed Care – PPO | Admitting: Physician Assistant

## 2014-01-29 ENCOUNTER — Encounter: Payer: Self-pay | Admitting: Physician Assistant

## 2014-01-29 VITALS — BP 110/82 | HR 97 | Temp 97.6°F | Resp 16 | Ht 62.0 in | Wt 146.5 lb

## 2014-01-29 DIAGNOSIS — H6590 Unspecified nonsuppurative otitis media, unspecified ear: Secondary | ICD-10-CM

## 2014-01-29 DIAGNOSIS — R252 Cramp and spasm: Secondary | ICD-10-CM

## 2014-01-29 DIAGNOSIS — H6591 Unspecified nonsuppurative otitis media, right ear: Secondary | ICD-10-CM

## 2014-01-29 LAB — COMPREHENSIVE METABOLIC PANEL
ALK PHOS: 86 U/L (ref 39–117)
ALT: 16 U/L (ref 0–35)
AST: 17 U/L (ref 0–37)
Albumin: 4.4 g/dL (ref 3.5–5.2)
BILIRUBIN TOTAL: 0.6 mg/dL (ref 0.2–1.2)
BUN: 11 mg/dL (ref 6–23)
CO2: 29 mEq/L (ref 19–32)
CREATININE: 0.78 mg/dL (ref 0.50–1.10)
Calcium: 9.1 mg/dL (ref 8.4–10.5)
Chloride: 102 mEq/L (ref 96–112)
GLUCOSE: 97 mg/dL (ref 70–99)
Potassium: 4 mEq/L (ref 3.5–5.3)
SODIUM: 139 meq/L (ref 135–145)
TOTAL PROTEIN: 7.2 g/dL (ref 6.0–8.3)

## 2014-01-29 LAB — MAGNESIUM: MAGNESIUM: 2 mg/dL (ref 1.5–2.5)

## 2014-01-29 MED ORDER — DILTIAZEM HCL 30 MG PO TABS
30.0000 mg | ORAL_TABLET | Freq: Every day | ORAL | Status: DC
Start: 2014-01-29 — End: 2014-10-30

## 2014-01-29 MED ORDER — AMOXICILLIN 875 MG PO TABS: 875.0000 mg | ORAL_TABLET | Freq: Two times a day (BID) | ORAL | Status: DC

## 2014-01-29 MED ORDER — FLUTICASONE PROPIONATE 50 MCG/ACT NA SUSP: 2.0000 | Freq: Every day | NASAL | Status: DC

## 2014-01-29 NOTE — Assessment & Plan Note (Signed)
Rx: Amoxicillin. ?

## 2014-01-29 NOTE — Assessment & Plan Note (Signed)
Will obtain lab workup today.  Rx Diltiazem 30 mg QHS for cramping.  Follow-up in 2 weeks.

## 2014-01-29 NOTE — Progress Notes (Signed)
Pre visit review using our clinic review tool, if applicable. No additional management support is needed unless otherwise documented below in the visit note/SLS  

## 2014-01-29 NOTE — Patient Instructions (Signed)
Please obtain labs. I will call you with your results.  We will tailor treatment based on results.  Stay well hydrated. Eat a well-balanced diet. A gatorade daily is a good option.  Please take Diltiazem 30 mg tablet each evening.  Follow-up with me in 2 weeks.  If you fell lightheaded while on medication, please stop medicine and call the office.  Muscle Cramps and Spasms Muscle cramps and spasms occur when a muscle or muscles tighten and you have no control over this tightening (involuntary muscle contraction). They are a common problem and can develop in any muscle. The most common place is in the calf muscles of the leg. Both muscle cramps and muscle spasms are involuntary muscle contractions, but they also have differences:   Muscle cramps are sporadic and painful. They may last a few seconds to a quarter of an hour. Muscle cramps are often more forceful and last longer than muscle spasms.  Muscle spasms may or may not be painful. They may also last just a few seconds or much longer. CAUSES  It is uncommon for cramps or spasms to be due to a serious underlying problem. In many cases, the cause of cramps or spasms is unknown. Some common causes are:   Overexertion.   Overuse from repetitive motions (doing the same thing over and over).   Remaining in a certain position for a long period of time.   Improper preparation, form, or technique while performing a sport or activity.   Dehydration.   Injury.   Side effects of some medicines.   Abnormally low levels of the salts and ions in your blood (electrolytes), especially potassium and calcium. This could happen if you are taking water pills (diuretics) or you are pregnant.  Some underlying medical problems can make it more likely to develop cramps or spasms. These include, but are not limited to:   Diabetes.   Parkinson disease.   Hormone disorders, such as thyroid problems.   Alcohol abuse.   Diseases specific to  muscles, joints, and bones.   Blood vessel disease where not enough blood is getting to the muscles.  HOME CARE INSTRUCTIONS   Stay well hydrated. Drink enough water and fluids to keep your urine clear or pale yellow.  It may be helpful to massage, stretch, and relax the affected muscle.  For tight or tense muscles, use a warm towel, heating pad, or hot shower water directed to the affected area.  If you are sore or have pain after a cramp or spasm, applying ice to the affected area may relieve discomfort.  Put ice in a plastic bag.  Place a towel between your skin and the bag.  Leave the ice on for 15-20 minutes, 03-04 times a day.  Medicines used to treat a known cause of cramps or spasms may help reduce their frequency or severity. Only take over-the-counter or prescription medicines as directed by your caregiver. SEEK MEDICAL CARE IF:  Your cramps or spasms get more severe, more frequent, or do not improve over time.  MAKE SURE YOU:   Understand these instructions.  Will watch your condition.  Will get help right away if you are not doing well or get worse. Document Released: 11/11/2001 Document Revised: 09/16/2012 Document Reviewed: 05/08/2012 [facility] Patient Information 2015 [place], [place]. This information is not intended to replace advice given to you by your health care provider. Make sure you discuss any questions you have with your health care provider.

## 2014-01-29 NOTE — Progress Notes (Signed)
Patient presents to clinic today c/o continued intermittent muscle spasms of upper back and bilateral lower extremities.  Is trying to stay well hydrated and eat a well balanced meal.  Patient denies recent trauma or injury.  Flexeril only making her sleepy.   Patient also c/o R ear pain after getting some water in her ear.  Pain is aching in nature and has been present for 5 days.  Denies change to hearing, drainage from ear or tinnitus.  Past Medical History  Diagnosis Date  . GERD (gastroesophageal reflux disease)   . Vitamin D deficiency   . Chicken pox   . Seasonal allergies   . Environmental allergies   . Glaucoma (increased eye pressure)     Borderline    Current Outpatient Prescriptions on File Prior to Visit  Medication Sig Dispense Refill  . aspirin EC 81 MG tablet Take 81 mg by mouth daily as needed.       . calcium carbonate (TUMS - DOSED IN MG ELEMENTAL CALCIUM) 500 MG chewable tablet Chew 2 tablets by mouth once.        . cyclobenzaprine (FLEXERIL) 10 MG tablet Take 1 tablet (10 mg total) by mouth 3 (three) times daily as needed for muscle spasms.  30 tablet  0  . fish oil-omega-3 fatty acids 1000 MG capsule Take 1 g by mouth every other day.       . loratadine (CLARITIN) 10 MG tablet Take 10 mg by mouth daily as needed.       . magnesium hydroxide (MILK OF MAGNESIA) 800 MG/5ML suspension Take 30 mLs by mouth daily as needed.       . Multiple Vitamin (MULTIVITAMIN) tablet Take 1 tablet by mouth daily.        . pantoprazole (PROTONIX) 40 MG tablet Take 1 tablet (40 mg total) by mouth daily.  30 tablet  3   No current facility-administered medications on file prior to visit.    Allergies  Allergen Reactions  . Codeine Nausea And Vomiting  . Prednisone Nausea And Vomiting    Family History  Problem Relation Age of Onset  . Hypertension Mother     Living  . Stroke Mother     multiple  . COPD Mother   . COPD Father 61    Deceased  . Bone cancer Paternal  Grandmother   . Diabetes Mother   . Glaucoma Maternal Aunt   . Hypertension Brother     x3  . Heart disease Brother     x1  . Asthma Son   . Allergies Son   . Eczema Son     x1  . Eczema Daughter     x3    History   Social History  . Marital Status: Legally Separated    Spouse Name: N/A    Number of Children: N/A  . Years of Education: N/A   Social History Main Topics  . Smoking status: Never Smoker   . Smokeless tobacco: Never Used  . Alcohol Use: Yes     Comment: rare  . Drug Use: No  . Sexual Activity: None   Other Topics Concern  . None   Social History Narrative  . None   Review of Systems - See HPI.  All other ROS are negative.  BP 110/82  Pulse 97  Temp(Src) 97.6 F (36.4 C) (Oral)  Resp 16  Ht  (1.575 m)  Wt 146 lb 8 oz (66.452 kg)  BMI 26.79 kg/m2  SpO2 98%  Physical Exam  Vitals reviewed. Constitutional: She is oriented to person, place, and time and well-developed, well-nourished, and in no distress.  HENT:  Head: Normocephalic and atraumatic.  Right Ear: External ear and ear canal normal. Tympanic membrane is erythematous and bulging. A middle ear effusion is present.  Left Ear: External ear and ear canal normal.  Nose: Nose normal.  Mouth/Throat: Uvula is midline, oropharynx is clear and moist and mucous membranes are normal. No oropharyngeal exudate.  Eyes: Conjunctivae are normal.  Neck: Neck supple.  Cardiovascular: Normal rate, regular rhythm, normal heart sounds and intact distal pulses.   Pulmonary/Chest: Effort normal and breath sounds normal. No respiratory distress. She has no wheezes. She has no rales. She exhibits no tenderness.  Lymphadenopathy:    She has no cervical adenopathy.  Neurological: She is alert and oriented to person, place, and time.  Skin: Skin is warm and dry. No rash noted.  Psychiatric: Affect normal.   Assessment/Plan: Right otitis media with effusion Rx Amoxicillin.  Muscle cramps Will obtain  lab workup today.  Rx Diltiazem 30 mg QHS for cramping.  Follow-up in 2 weeks.

## 2014-01-29 NOTE — Telephone Encounter (Signed)
Pt stated that "she was in the office today and Dr. Daphine Deutscher was going to send a Rx in for Flonase to her pharmacy but it isn't there." LDM

## 2014-01-29 NOTE — Telephone Encounter (Signed)
Rx should be waiting at pharmacy now.  It somehow did not go through the first time.  I apologize.

## 2014-01-30 LAB — VITAMIN B12: Vitamin B-12: 548 pg/mL (ref 211–911)

## 2014-02-03 LAB — VITAMIN B6: Vitamin B6: 17.1 ng/mL (ref 2.1–21.7)

## 2014-05-27 ENCOUNTER — Ambulatory Visit: Payer: BC Managed Care – PPO | Admitting: Physician Assistant

## 2014-06-15 ENCOUNTER — Ambulatory Visit (INDEPENDENT_AMBULATORY_CARE_PROVIDER_SITE_OTHER): Payer: BLUE CROSS/BLUE SHIELD | Admitting: Medical

## 2014-06-15 DIAGNOSIS — R5383 Other fatigue: Secondary | ICD-10-CM

## 2014-06-15 DIAGNOSIS — R3915 Urgency of urination: Secondary | ICD-10-CM

## 2014-06-15 DIAGNOSIS — R350 Frequency of micturition: Secondary | ICD-10-CM

## 2014-06-15 DIAGNOSIS — R631 Polydipsia: Secondary | ICD-10-CM

## 2014-06-15 LAB — POCT URINALYSIS DIPSTICK
Glucose, UA: NEGATIVE
Ketones, UA: NEGATIVE
Leukocytes, UA: NEGATIVE
NITRITE UA: NEGATIVE
UROBILINOGEN UA: 0.2
pH, UA: 5

## 2014-06-15 LAB — GLUCOSE, POCT (MANUAL RESULT ENTRY): POC Glucose: 88 mg/dl (ref 70–99)

## 2014-06-15 MED ORDER — CIPROFLOXACIN HCL 250 MG PO TABS: 250.0000 mg | ORAL_TABLET | Freq: Two times a day (BID) | ORAL | Status: DC

## 2014-06-15 NOTE — Assessment & Plan Note (Signed)
For your fatigue, I want to get cbc and cmp today.

## 2014-06-15 NOTE — Progress Notes (Signed)
Subjective:    Patient ID: Christine Griffith, female    DOB: [birth year], 49 y.o.   MRN: [id number]  HPI   Pt in feeling fatigue, mild suprapubic region  thirsty, and urinating more  frequently than usual present x 2-3 day. She has urgency to urinate. No history of diabetes. Some pain over bottom aspect of her abdomen over her bladder. Pt has hx of hysterectomy. No constipation.   Pt used to have a lot of urinary tract infections when younger. About 10 years since any uti.  No diffuse body aches or myalgias.   No diarrhea or constipation  No fevers or chills. No ha.  Past Medical History  Diagnosis Date  . GERD (gastroesophageal reflux disease)   . Vitamin D deficiency   . Chicken pox   . Seasonal allergies   . Environmental allergies   . Glaucoma (increased eye pressure)     Borderline    History   Social History  . Marital Status: Legally Separated    Spouse Name: N/A    Number of Children: N/A  . Years of Education: N/A   Occupational History  . Not on file.   Social History Main Topics  . Smoking status: Never Smoker   . Smokeless tobacco: Never Used  . Alcohol Use: Yes     Comment: rare  . Drug Use: No  . Sexual Activity: Not on file   Other Topics Concern  . Not on file   Social History Narrative  . No narrative on file    Past Surgical History  Procedure Laterality Date  . Cesarean section    . Abdominal hysterectomy    . Wisdom tooth extraction      Family History  Problem Relation Age of Onset  . Hypertension Mother     Living  . Stroke Mother     multiple  . COPD Mother   . COPD Father 26    Deceased  . Bone cancer Paternal Grandmother   . Diabetes Mother   . Glaucoma Maternal Aunt   . Hypertension Brother     x3  . Heart disease Brother     x1  . Asthma Son   . Allergies Son   . Eczema Son     x1  . Eczema Daughter     x3    Allergies  Allergen Reactions  . Codeine Nausea And Vomiting  . Prednisone Nausea And Vomiting     Current Outpatient Prescriptions on File Prior to Visit  Medication Sig Dispense Refill  . aspirin EC 81 MG tablet Take 81 mg by mouth daily as needed.     . calcium carbonate (TUMS - DOSED IN MG ELEMENTAL CALCIUM) 500 MG chewable tablet Chew 2 tablets by mouth once.      . cyclobenzaprine (FLEXERIL) 10 MG tablet Take 1 tablet (10 mg total) by mouth 3 (three) times daily as needed for muscle spasms. 30 tablet 0  . diltiazem (CARDIZEM) 30 MG tablet Take 1 tablet (30 mg total) by mouth at bedtime. 30 tablet 0  . fish oil-omega-3 fatty acids 1000 MG capsule Take 1 g by mouth every other day.     . fluticasone (FLONASE) 50 MCG/ACT nasal spray Place 2 sprays into both nostrils daily. 16 g 6  . loratadine (CLARITIN) 10 MG tablet Take 10 mg by mouth daily as needed.     . magnesium hydroxide (MILK OF MAGNESIA) 800 MG/5ML suspension Take 30 mLs by mouth daily as needed.     Marland Kitchen  Multiple Vitamin (MULTIVITAMIN) tablet Take 1 tablet by mouth daily.      . pantoprazole (PROTONIX) 40 MG tablet Take 1 tablet (40 mg total) by mouth daily. 30 tablet 3   No current facility-administered medications on file prior to visit.    There were no vitals taken for this visit.     Review of Systems  Constitutional: Positive for fatigue. Negative for fever and chills.  HENT: Negative.   Respiratory: Negative for cough, choking, chest tightness, shortness of breath and wheezing.   Cardiovascular: Negative for chest pain and palpitations.  Gastrointestinal: Negative for abdominal pain.  Endocrine: Positive for polydipsia.  Genitourinary: Positive for urgency and frequency. Negative for dysuria, hematuria, flank pain, decreased urine volume, vaginal bleeding, difficulty urinating, genital sores, vaginal pain and pelvic pain.  Musculoskeletal: Negative for back pain.  Neurological: Negative for facial asymmetry, numbness and headaches.  Hematological: Negative for adenopathy.  Psychiatric/Behavioral: Negative  for behavioral problems, confusion and agitation.       Objective:   Physical Exam   General  Mental Status - Alert. General Appearance - Well groomed. Not in acute distress.  Skin Rashes- No Rashes.  HEENT Head- Normal. Ear Auditory Canal - Left- Normal. Right - Normal.Tympanic Membrane- Left- Normal. Right- Normal. Eye Sclera/Conjunctiva- Left- Normal. Right- Normal. Nose & Sinuses Nasal Mucosa- Left-  Not oggy or Congested. Right-  Not  boggy or Congested. Mouth & Throat Lips: Upper Lip- Normal: no dryness, cracking, pallor, cyanosis, or vesicular eruption. Lower Lip-Normal: no dryness, cracking, pallor, cyanosis or vesicular eruption. Buccal Mucosa- Bilateral- No Aphthous ulcers. Oropharynx- No Discharge or Erythema. Tonsils: Characteristics- Bilateral- No Erythema or Congestion. Size/Enlargement- Bilateral- No enlargement. Discharge- bilateral-None.  Neck Neck- Supple. No Masses.   Chest and Lung Exam Auscultation: Breath Sounds:- even and unlabored,  Cardiovascular Auscultation:Rythm- Regular, rate and rhythm. Murmurs & Other Heart Sounds:Ausculatation of the heart reveal- No Murmurs.  Lymphatic Head & Neck General Head & Neck Lymphatics: Bilateral: Description- No Localized lymphadenopathy.    Abdomen Inspection:-Inspection Normal.  Palpation/Perucssion: Palpation and Percussion of the abdomen reveal- mild mid suprapubic  Tender, No Rebound tenderness, No rigidity(Guarding) and No Palpable abdominal masses.  Liver:-Normal.  Spleen:- Normal.   Back no cva tenderness.           Assessment & Plan:  When asked if she had myalgias she states no. Only some rt shoulder pain that is not new. Not worsened recently. When I asked her her reason for visit described the above as described on hop. She only brought shoulder when I inquired about any diffuse myalgias.

## 2014-06-15 NOTE — Progress Notes (Signed)
Pre visit review using our clinic review tool, if applicable. No additional management support is needed unless otherwise documented below in the visit note. 

## 2014-06-15 NOTE — Patient Instructions (Addendum)
For your fatigue, I want to get cbc and cmp today.   For your increased thirst, we did finger sick blood sugar today which showed bs of 88.   I am considering possible uti. We are sending your urine out for culture. I am going to put you on cipro rx pending the urine culture results. Pending lab results if your symptoms change or worsen notify us.  Follow up 7-10 days or as needed.

## 2014-06-15 NOTE — Assessment & Plan Note (Signed)
For your increased thirst, we did finger sick blood sugar today which showed bs of 88.

## 2014-06-15 NOTE — Assessment & Plan Note (Signed)
I am considering possible uti. We are sending your urine out for culture. I am going to put you on cipro rx pending the urine culture results. Pending lab results if your symptoms change or worsen notify us.

## 2014-06-16 LAB — CBC WITH DIFFERENTIAL/PLATELET
BASOS ABS: 0 10*3/uL (ref 0.0–0.1)
Basophils Relative: 0.5 % (ref 0.0–3.0)
Eosinophils Absolute: 0.3 10*3/uL (ref 0.0–0.7)
Eosinophils Relative: 3.1 % (ref 0.0–5.0)
HCT: 38.6 % (ref 36.0–46.0)
HEMOGLOBIN: 12.3 g/dL (ref 12.0–15.0)
LYMPHS PCT: 29.9 % (ref 12.0–46.0)
Lymphs Abs: 2.7 10*3/uL (ref 0.7–4.0)
MCHC: 31.9 g/dL (ref 30.0–36.0)
MCV: 89.1 fl (ref 78.0–100.0)
Monocytes Absolute: 0.6 10*3/uL (ref 0.1–1.0)
Monocytes Relative: 6.3 % (ref 3.0–12.0)
NEUTROS PCT: 60.2 % (ref 43.0–77.0)
Neutro Abs: 5.3 10*3/uL (ref 1.4–7.7)
Platelets: 396 10*3/uL (ref 150.0–400.0)
RBC: 4.34 Mil/uL (ref 3.87–5.11)
RDW: 14.3 % (ref 11.5–15.5)
WBC: 8.9 10*3/uL (ref 4.0–10.5)

## 2014-06-16 LAB — TSH: TSH: 1.34 u[IU]/mL (ref 0.35–4.50)

## 2014-06-16 LAB — COMPREHENSIVE METABOLIC PANEL
ALT: 15 U/L (ref 0–35)
AST: 16 U/L (ref 0–37)
Albumin: 4.1 g/dL (ref 3.5–5.2)
Alkaline Phosphatase: 86 U/L (ref 39–117)
BUN: 14 mg/dL (ref 6–23)
CALCIUM: 9.4 mg/dL (ref 8.4–10.5)
CHLORIDE: 104 meq/L (ref 96–112)
CO2: 26 meq/L (ref 19–32)
CREATININE: 0.8 mg/dL (ref 0.4–1.2)
GFR: 97.25 mL/min (ref >60.00)
Glucose, Bld: 81 mg/dL (ref 70–99)
Potassium: 3.8 mEq/L (ref 3.5–5.1)
Sodium: 137 mEq/L (ref 135–145)
Total Bilirubin: 0.8 mg/dL (ref 0.2–1.2)
Total Protein: 7.9 g/dL (ref 6.0–8.3)

## 2014-06-17 LAB — URINE CULTURE: Colony Count: 30000

## 2014-06-25 ENCOUNTER — Telehealth: Payer: Self-pay | Admitting: Medical

## 2014-06-25 ENCOUNTER — Encounter: Payer: Self-pay | Admitting: Medical

## 2014-06-25 NOTE — Telephone Encounter (Signed)
Pt can come in for repeat ua. But I would like her to also have appointment.

## 2014-06-29 NOTE — Telephone Encounter (Signed)
Called patient to schedule appointment for OV and repeat UA. States she actually feels better and will call if needed.

## 2014-10-29 ENCOUNTER — Telehealth: Payer: Self-pay | Admitting: *Deleted

## 2014-10-29 ENCOUNTER — Encounter: Payer: Self-pay | Admitting: *Deleted

## 2014-10-29 NOTE — Telephone Encounter (Signed)
Pre-Visit Call completed with patient and chart updated.   Pre-Visit Info documented in Specialty Comments under [employer].    

## 2014-10-30 ENCOUNTER — Encounter: Payer: Self-pay | Admitting: Physician Assistant

## 2014-10-30 ENCOUNTER — Ambulatory Visit (INDEPENDENT_AMBULATORY_CARE_PROVIDER_SITE_OTHER): Payer: BLUE CROSS/BLUE SHIELD | Admitting: Physician Assistant

## 2014-10-30 VITALS — BP 127/74 | HR 84 | Temp 98.2°F | Ht 63.0 in | Wt 148.6 lb

## 2014-10-30 DIAGNOSIS — R350 Frequency of micturition: Secondary | ICD-10-CM | POA: Diagnosis not present

## 2014-10-30 DIAGNOSIS — R252 Cramp and spasm: Secondary | ICD-10-CM

## 2014-10-30 DIAGNOSIS — Z23 Encounter for immunization: Secondary | ICD-10-CM | POA: Diagnosis not present

## 2014-10-30 DIAGNOSIS — Z0000 Encounter for general adult medical examination without abnormal findings: Secondary | ICD-10-CM | POA: Diagnosis not present

## 2014-10-30 LAB — URINALYSIS, ROUTINE W REFLEX MICROSCOPIC
Bilirubin Urine: NEGATIVE
Ketones, ur: NEGATIVE
LEUKOCYTES UA: NEGATIVE
Nitrite: NEGATIVE
TOTAL PROTEIN, URINE-UPE24: NEGATIVE
UROBILINOGEN UA: 0.2 (ref 0.0–1.0)
Urine Glucose: NEGATIVE
pH: 6 (ref 5.0–8.0)

## 2014-10-30 LAB — BASIC METABOLIC PANEL
BUN: 14 mg/dL (ref 6–23)
CALCIUM: 9.1 mg/dL (ref 8.4–10.5)
CO2: 28 meq/L (ref 19–32)
Chloride: 106 mEq/L (ref 96–112)
Creatinine, Ser: 0.79 mg/dL (ref 0.40–1.20)
GFR: 99.93 mL/min (ref >60.00)
Glucose, Bld: 92 mg/dL (ref 70–99)
Potassium: 3.8 mEq/L (ref 3.5–5.1)
SODIUM: 141 meq/L (ref 135–145)

## 2014-10-30 LAB — CBC
HEMATOCRIT: 35.8 % — AB (ref 36.0–46.0)
Hemoglobin: 11.9 g/dL — ABNORMAL LOW (ref 12.0–15.0)
MCHC: 33.2 g/dL (ref 30.0–36.0)
MCV: 86.4 fl (ref 78.0–100.0)
Platelets: 365 10*3/uL (ref 150.0–400.0)
RBC: 4.14 Mil/uL (ref 3.87–5.11)
RDW: 14.3 % (ref 11.5–15.5)
WBC: 6.7 10*3/uL (ref 4.0–10.5)

## 2014-10-30 LAB — LIPID PANEL
CHOLESTEROL: 233 mg/dL — AB (ref 0–200)
HDL: 51.3 mg/dL (ref >39.00)
LDL CALC: 171 mg/dL — AB (ref 0–99)
NonHDL: 181.7
Total CHOL/HDL Ratio: 5
Triglycerides: 52 mg/dL (ref 0.0–149.0)
VLDL: 10.4 mg/dL (ref 0.0–40.0)

## 2014-10-30 LAB — HEPATIC FUNCTION PANEL
ALBUMIN: 4.1 g/dL (ref 3.5–5.2)
ALT: 14 U/L (ref 0–35)
AST: 15 U/L (ref 0–37)
Alkaline Phosphatase: 79 U/L (ref 39–117)
BILIRUBIN TOTAL: 0.5 mg/dL (ref 0.2–1.2)
Bilirubin, Direct: 0.1 mg/dL (ref 0.0–0.3)
Total Protein: 7.2 g/dL (ref 6.0–8.3)

## 2014-10-30 LAB — HEPATITIS PANEL, ACUTE
HCV Ab: NEGATIVE
HEP A IGM: NONREACTIVE
Hep B C IgM: NONREACTIVE
Hepatitis B Surface Ag: NEGATIVE

## 2014-10-30 LAB — HEMOGLOBIN A1C: HEMOGLOBIN A1C: 5.6 % (ref 4.6–6.5)

## 2014-10-30 LAB — TSH: TSH: 1.8 u[IU]/mL (ref 0.35–4.50)

## 2014-10-30 MED ORDER — DILTIAZEM HCL 30 MG PO TABS: 30.0000 mg | ORAL_TABLET | Freq: Every day | ORAL | Status: DC

## 2014-10-30 MED ORDER — RANITIDINE HCL 150 MG PO TABS: 150.0000 mg | ORAL_TABLET | Freq: Two times a day (BID) | ORAL | Status: DC

## 2014-10-30 NOTE — Assessment & Plan Note (Signed)
Previous workup negative.  Will check UA, BMP and A1C.  Will treat based on results. If all unremarkable will refer to Urology.

## 2014-10-30 NOTE — Assessment & Plan Note (Signed)
Depression screen negative.  Health Maintenance reviewed. TDaP given today. Preventive schedule discussed.  Up-to-date on PAP.  Is followed by OB/GYN. Will obtain fasting labs today.  Handout given in AVS.

## 2014-10-30 NOTE — Progress Notes (Signed)
Pre visit review using our clinic review tool, if applicable. No additional management support is needed unless otherwise documented below in the visit note. 

## 2014-10-30 NOTE — Assessment & Plan Note (Signed)
Reiterated use of Diltiazem.  Will take daily for 2 week trial.  Will recheck BMP and CBC as well as TSH today.

## 2014-10-30 NOTE — Patient Instructions (Signed)
Please go to the lab for blood work.  I will call you with your results. Please take the Zantac twice daily as directed. Again try to take the Diltiazem daily to help prevent cramping.  Follow-up in 2 weeks.  Preventive Care for Adults A healthy lifestyle and preventive care can promote health and wellness. Preventive health guidelines for women include the following key practices.  A routine yearly physical is a good way to check with your health care provider about your health and preventive screening. It is a chance to share any concerns and updates on your health and to receive a thorough exam.  Visit your dentist for a routine exam and preventive care every 6 months. Brush your teeth twice a day and floss once a day. Good oral hygiene prevents tooth decay and gum disease.  The frequency of eye exams is based on your age, health, family medical history, use of contact lenses, and other factors. Follow your health care provider's recommendations for frequency of eye exams.  Eat a healthy diet. Foods like vegetables, fruits, whole grains, low-fat dairy products, and lean protein foods contain the nutrients you need without too many calories. Decrease your intake of foods high in solid fats, added sugars, and salt. Eat the right amount of calories for you.Get information about a proper diet from your health care provider, if necessary.  Regular physical exercise is one of the most important things you can do for your health. Most adults should get at least 150 minutes of moderate-intensity exercise (any activity that increases your heart rate and causes you to sweat) each week. In addition, most adults need muscle-strengthening exercises on 2 or more days a week.  Maintain a healthy weight. The body mass index (BMI) is a screening tool to identify possible weight problems. It provides an estimate of body fat based on height and weight. Your health care provider can find your BMI and can help you  achieve or maintain a healthy weight.For adults 20 years and older:  A BMI below 18.5 is considered underweight.  A BMI of 18.5 to 24.9 is normal.  A BMI of 25 to 29.9 is considered overweight.  A BMI of 30 and above is considered obese.  Maintain normal blood lipids and cholesterol levels by exercising and minimizing your intake of saturated fat. Eat a balanced diet with plenty of fruit and vegetables. Blood tests for lipids and cholesterol should begin at age 15 and be repeated every 5 years. If your lipid or cholesterol levels are high, you are over 50, or you are at high risk for heart disease, you may need your cholesterol levels checked more frequently.Ongoing high lipid and cholesterol levels should be treated with medicines if diet and exercise are not working.  If you smoke, find out from your health care provider how to quit. If you do not use tobacco, do not start.  Lung cancer screening is recommended for adults aged 49-80 years who are at high risk for developing lung cancer because of a history of smoking. A yearly low-dose CT scan of the lungs is recommended for people who have at least a 30-pack-year history of smoking and are a current smoker or have quit within the past 15 years. A pack year of smoking is smoking an average of 1 pack of cigarettes a day for 1 year (for example: 1 pack a day for 30 years or 2 packs a day for 15 years). Yearly screening should continue until the smoker has  stopped smoking for at least 15 years. Yearly screening should be stopped for people who develop a health problem that would prevent them from having lung cancer treatment.  If you are pregnant, do not drink alcohol. If you are breastfeeding, be very cautious about drinking alcohol. If you are not pregnant and choose to drink alcohol, do not have more than 1 drink per day. One drink is considered to be 12 ounces (355 mL) of beer, 5 ounces (148 mL) of wine, or 1.5 ounces (44 mL) of liquor.  Avoid  use of street drugs. Do not share needles with anyone. Ask for help if you need support or instructions about stopping the use of drugs.  High blood pressure causes heart disease and increases the risk of stroke. Your blood pressure should be checked at least every 1 to 2 years. Ongoing high blood pressure should be treated with medicines if weight loss and exercise do not work.  If you are 45-15 years old, ask your health care provider if you should take aspirin to prevent strokes.  Diabetes screening involves taking a blood sample to check your fasting blood sugar level. This should be done once every 3 years, after age 34, if you are within normal weight and without risk factors for diabetes. Testing should be considered at a younger age or be carried out more frequently if you are overweight and have at least 1 risk factor for diabetes.  Breast cancer screening is essential preventive care for women. You should practice "breast self-awareness." This means understanding the normal appearance and feel of your breasts and may include breast self-examination. Any changes detected, no matter how small, should be reported to a health care provider. Women in their 51s and 30s should have a clinical breast exam (CBE) by a health care provider as part of a regular health exam every 1 to 3 years. After age 35, women should have a CBE every year. Starting at age 42, women should consider having a mammogram (breast X-ray test) every year. Women who have a family history of breast cancer should talk to their health care provider about genetic screening. Women at a high risk of breast cancer should talk to their health care providers about having an MRI and a mammogram every year.  Breast cancer gene (BRCA)-related cancer risk assessment is recommended for women who have family members with BRCA-related cancers. BRCA-related cancers include breast, ovarian, tubal, and peritoneal cancers. Having family members with  these cancers may be associated with an increased risk for harmful changes (mutations) in the breast cancer genes BRCA1 and BRCA2. Results of the assessment will determine the need for genetic counseling and BRCA1 and BRCA2 testing.  Routine pelvic exams to screen for cancer are no longer recommended for nonpregnant women who are considered low risk for cancer of the pelvic organs (ovaries, uterus, and vagina) and who do not have symptoms. Ask your health care provider if a screening pelvic exam is right for you.  If you have had past treatment for cervical cancer or a condition that could lead to cancer, you need Pap tests and screening for cancer for at least 20 years after your treatment. If Pap tests have been discontinued, your risk factors (such as having a new sexual partner) need to be reassessed to determine if screening should be resumed. Some women have medical problems that increase the chance of getting cervical cancer. In these cases, your health care provider may recommend more frequent screening and Pap tests.  The HPV test is an additional test that may be used for cervical cancer screening. The HPV test looks for the virus that can cause the cell changes on the cervix. The cells collected during the Pap test can be tested for HPV. The HPV test could be used to screen women aged 27 years and older, and should be used in women of any age who have unclear Pap test results. After the age of 9, women should have HPV testing at the same frequency as a Pap test.  Colorectal cancer can be detected and often prevented. Most routine colorectal cancer screening begins at the age of 13 years and continues through age 24 years. However, your health care provider may recommend screening at an earlier age if you have risk factors for colon cancer. On a yearly basis, your health care provider may provide home test kits to check for hidden blood in the stool. Use of a small camera at the end of a tube, to  directly examine the colon (sigmoidoscopy or colonoscopy), can detect the earliest forms of colorectal cancer. Talk to your health care provider about this at age 52, when routine screening begins. Direct exam of the colon should be repeated every 5-10 years through age 41 years, unless early forms of pre-cancerous polyps or small growths are found.  People who are at an increased risk for hepatitis B should be screened for this virus. You are considered at high risk for hepatitis B if:  You were born in a country where hepatitis B occurs often. Talk with your health care provider about which countries are considered high risk.  Your parents were born in a high-risk country and you have not received a shot to protect against hepatitis B (hepatitis B vaccine).  You have HIV or AIDS.  You use needles to inject street drugs.  You live with, or have sex with, someone who has hepatitis B.  You get hemodialysis treatment.  You take certain medicines for conditions like cancer, organ transplantation, and autoimmune conditions.  Hepatitis C blood testing is recommended for all people born from [birth year] through 1965 and any individual with known risks for hepatitis C.  Practice safe sex. Use condoms and avoid high-risk sexual practices to reduce the spread of sexually transmitted infections (STIs). STIs include gonorrhea, chlamydia, syphilis, trichomonas, herpes, HPV, and human immunodeficiency virus (HIV). Herpes, HIV, and HPV are viral illnesses that have no cure. They can result in disability, cancer, and death.  You should be screened for sexually transmitted illnesses (STIs) including gonorrhea and chlamydia if:  You are sexually active and are younger than 24 years.  You are older than 24 years and your health care provider tells you that you are at risk for this type of infection.  Your sexual activity has changed since you were last screened and you are at an increased risk for chlamydia or  gonorrhea. Ask your health care provider if you are at risk.  If you are at risk of being infected with HIV, it is recommended that you take a prescription medicine daily to prevent HIV infection. This is called preexposure prophylaxis (PrEP). You are considered at risk if:  You are a heterosexual woman, are sexually active, and are at increased risk for HIV infection.  You take drugs by injection.  You are sexually active with a partner who has HIV.  Talk with your health care provider about whether you are at high risk of being infected with HIV. If you  choose to begin PrEP, you should first be tested for HIV. You should then be tested every 3 months for as long as you are taking PrEP.  Osteoporosis is a disease in which the bones lose minerals and strength with aging. This can result in serious bone fractures or breaks. The risk of osteoporosis can be identified using a bone density scan. Women ages 73 years and over and women at risk for fractures or osteoporosis should discuss screening with their health care providers. Ask your health care provider whether you should take a calcium supplement or vitamin D to reduce the rate of osteoporosis.  Menopause can be associated with physical symptoms and risks. Hormone replacement therapy is available to decrease symptoms and risks. You should talk to your health care provider about whether hormone replacement therapy is right for you.  Use sunscreen. Apply sunscreen liberally and repeatedly throughout the day. You should seek shade when your shadow is shorter than you. Protect yourself by wearing long sleeves, pants, a wide-brimmed hat, and sunglasses year round, whenever you are outdoors.  Once a month, do a whole body skin exam, using a mirror to look at the skin on your back. Tell your health care provider of new moles, moles that have irregular borders, moles that are larger than a pencil eraser, or moles that have changed in shape or  color.  Stay current with required vaccines (immunizations).  Influenza vaccine. All adults should be immunized every year.  Tetanus, diphtheria, and acellular pertussis (Td, Tdap) vaccine. Pregnant women should receive 1 dose of Tdap vaccine during each pregnancy. The dose should be obtained regardless of the length of time since the last dose. Immunization is preferred during the 27th-36th week of gestation. An adult who has not previously received Tdap or who does not know her vaccine status should receive 1 dose of Tdap. This initial dose should be followed by tetanus and diphtheria toxoids (Td) booster doses every 10 years. Adults with an unknown or incomplete history of completing a 3-dose immunization series with Td-containing vaccines should begin or complete a primary immunization series including a Tdap dose. Adults should receive a Td booster every 10 years.  Varicella vaccine. An adult without evidence of immunity to varicella should receive 2 doses or a second dose if she has previously received 1 dose. Pregnant females who do not have evidence of immunity should receive the first dose after pregnancy. This first dose should be obtained before leaving the health care facility. The second dose should be obtained 4-8 weeks after the first dose.  Human papillomavirus (HPV) vaccine. Females aged 13-26 years who have not received the vaccine previously should obtain the 3-dose series. The vaccine is not recommended for use in pregnant females. However, pregnancy testing is not needed before receiving a dose. If a female is found to be pregnant after receiving a dose, no treatment is needed. In that case, the remaining doses should be delayed until after the pregnancy. Immunization is recommended for any person with an immunocompromised condition through the age of 39 years if she did not get any or all doses earlier. During the 3-dose series, the second dose should be obtained 4-8 weeks after the  first dose. The third dose should be obtained 24 weeks after the first dose and 16 weeks after the second dose.  Zoster vaccine. One dose is recommended for adults aged 71 years or older unless certain conditions are present.  Measles, mumps, and rubella (MMR) vaccine. Adults born before  1956 generally are considered immune to measles and mumps. Adults born in [birth year] or later should have 1 or more doses of MMR vaccine unless there is a contraindication to the vaccine or there is laboratory evidence of immunity to each of the three diseases. A routine second dose of MMR vaccine should be obtained at least 28 days after the first dose for students attending postsecondary schools, health care workers, or international travelers. People who received inactivated measles vaccine or an unknown type of measles vaccine during 1963-1967 should receive 2 doses of MMR vaccine. People who received inactivated mumps vaccine or an unknown type of mumps vaccine before 1979 and are at high risk for mumps infection should consider immunization with 2 doses of MMR vaccine. For females of childbearing age, rubella immunity should be determined. If there is no evidence of immunity, females who are not pregnant should be vaccinated. If there is no evidence of immunity, females who are pregnant should delay immunization until after pregnancy. Unvaccinated health care workers born before [birth year] who lack laboratory evidence of measles, mumps, or rubella immunity or laboratory confirmation of disease should consider measles and mumps immunization with 2 doses of MMR vaccine or rubella immunization with 1 dose of MMR vaccine.  Pneumococcal 13-valent conjugate (PCV13) vaccine. When indicated, a person who is uncertain of her immunization history and has no record of immunization should receive the PCV13 vaccine. An adult aged 60 years or older who has certain medical conditions and has not been previously immunized should receive 1 dose of  PCV13 vaccine. This PCV13 should be followed with a dose of pneumococcal polysaccharide (PPSV23) vaccine. The PPSV23 vaccine dose should be obtained at least 8 weeks after the dose of PCV13 vaccine. An adult aged 8 years or older who has certain medical conditions and previously received 1 or more doses of PPSV23 vaccine should receive 1 dose of PCV13. The PCV13 vaccine dose should be obtained 1 or more years after the last PPSV23 vaccine dose.  Pneumococcal polysaccharide (PPSV23) vaccine. When PCV13 is also indicated, PCV13 should be obtained first. All adults aged 34 years and older should be immunized. An adult younger than age 105 years who has certain medical conditions should be immunized. Any person who resides in a nursing home or long-term care facility should be immunized. An adult smoker should be immunized. People with an immunocompromised condition and certain other conditions should receive both PCV13 and PPSV23 vaccines. People with human immunodeficiency virus (HIV) infection should be immunized as soon as possible after diagnosis. Immunization during chemotherapy or radiation therapy should be avoided. Routine use of PPSV23 vaccine is not recommended for American Indians, [place] Natives, or people younger than 65 years unless there are medical conditions that require PPSV23 vaccine. When indicated, people who have unknown immunization and have no record of immunization should receive PPSV23 vaccine. One-time revaccination 5 years after the first dose of PPSV23 is recommended for people aged 19-64 years who have chronic kidney failure, nephrotic syndrome, asplenia, or immunocompromised conditions. People who received 1-2 doses of PPSV23 before age 25 years should receive another dose of PPSV23 vaccine at age 25 years or later if at least 5 years have passed since the previous dose. Doses of PPSV23 are not needed for people immunized with PPSV23 at or after age 44 years.  Meningococcal vaccine.  Adults with asplenia or persistent complement component deficiencies should receive 2 doses of quadrivalent meningococcal conjugate (MenACWY-D) vaccine. The doses should be obtained at least 2 months  apart. Microbiologists working with certain meningococcal bacteria, [employer] recruits, people at risk during an outbreak, and people who travel to or live in countries with a high rate of meningitis should be immunized. A first-year college student up through age 4 years who is living in a residence hall should receive a dose if she did not receive a dose on or after her 16th birthday. Adults who have certain high-risk conditions should receive one or more doses of vaccine.  Hepatitis A vaccine. Adults who wish to be protected from this disease, have certain high-risk conditions, work with hepatitis A-infected animals, work in hepatitis A research labs, or travel to or work in countries with a high rate of hepatitis A should be immunized. Adults who were previously unvaccinated and who anticipate close contact with an international adoptee during the first 60 days after arrival in the [place] from a country with a high rate of hepatitis A should be immunized.  Hepatitis B vaccine. Adults who wish to be protected from this disease, have certain high-risk conditions, may be exposed to blood or other infectious body fluids, are household contacts or sex partners of hepatitis B positive people, are clients or workers in certain care facilities, or travel to or work in countries with a high rate of hepatitis B should be immunized.  Haemophilus influenzae type b (Hib) vaccine. A previously unvaccinated person with asplenia or sickle cell disease or having a scheduled splenectomy should receive 1 dose of Hib vaccine. Regardless of previous immunization, a recipient of a hematopoietic stem cell transplant should receive a 3-dose series 6-12 months after her successful transplant. Hib vaccine is not recommended for  adults with HIV infection. Preventive Services / Frequency Ages 67 to 64 years  Blood pressure check.** / Every 1 to 2 years.  Lipid and cholesterol check.** / Every 5 years beginning at age 44.  Clinical breast exam.** / Every 3 years for women in their 40s and 70s.  BRCA-related cancer risk assessment.** / For women who have family members with a BRCA-related cancer (breast, ovarian, tubal, or peritoneal cancers).  Pap test.** / Every 2 years from ages 23 through 56. Every 3 years starting at age 65 through age 42 or 66 with a history of 3 consecutive normal Pap tests.  HPV screening.** / Every 3 years from ages 84 through ages 72 to 2 with a history of 3 consecutive normal Pap tests.  Hepatitis C blood test.** / For any individual with known risks for hepatitis C.  Skin self-exam. / Monthly.  Influenza vaccine. / Every year.  Tetanus, diphtheria, and acellular pertussis (Tdap, Td) vaccine.** / Consult your health care provider. Pregnant women should receive 1 dose of Tdap vaccine during each pregnancy. 1 dose of Td every 10 years.  Varicella vaccine.** / Consult your health care provider. Pregnant females who do not have evidence of immunity should receive the first dose after pregnancy.  HPV vaccine. / 3 doses over 6 months, if 24 and younger. The vaccine is not recommended for use in pregnant females. However, pregnancy testing is not needed before receiving a dose.  Measles, mumps, rubella (MMR) vaccine.** / You need at least 1 dose of MMR if you were born in 1957 or later. You may also need a 2nd dose. For females of childbearing age, rubella immunity should be determined. If there is no evidence of immunity, females who are not pregnant should be vaccinated. If there is no evidence of immunity, females who are pregnant should  delay immunization until after pregnancy.  Pneumococcal 13-valent conjugate (PCV13) vaccine.** / Consult your health care provider.  Pneumococcal  polysaccharide (PPSV23) vaccine.** / 1 to 2 doses if you smoke cigarettes or if you have certain conditions.  Meningococcal vaccine.** / 1 dose if you are age 40 to 21 years and a Market researcher living in a residence hall, or have one of several medical conditions, you need to get vaccinated against meningococcal disease. You may also need additional booster doses.  Hepatitis A vaccine.** / Consult your health care provider.  Hepatitis B vaccine.** / Consult your health care provider.  Haemophilus influenzae type b (Hib) vaccine.** / Consult your health care provider. Ages 68 to 68 years  Blood pressure check.** / Every 1 to 2 years.  Lipid and cholesterol check.** / Every 5 years beginning at age 16 years.  Lung cancer screening. / Every year if you are aged 73-80 years and have a 30-pack-year history of smoking and currently smoke or have quit within the past 15 years. Yearly screening is stopped once you have quit smoking for at least 15 years or develop a health problem that would prevent you from having lung cancer treatment.  Clinical breast exam.** / Every year after age 72 years.  BRCA-related cancer risk assessment.** / For women who have family members with a BRCA-related cancer (breast, ovarian, tubal, or peritoneal cancers).  Mammogram.** / Every year beginning at age 31 years and continuing for as long as you are in good health. Consult with your health care provider.  Pap test.** / Every 3 years starting at age 60 years through age 39 or 47 years with a history of 3 consecutive normal Pap tests.  HPV screening.** / Every 3 years from ages 104 years through ages 56 to 58 years with a history of 3 consecutive normal Pap tests.  Fecal occult blood test (FOBT) of stool. / Every year beginning at age 39 years and continuing until age 94 years. You may not need to do this test if you get a colonoscopy every 10 years.  Flexible sigmoidoscopy or colonoscopy.** / Every 5  years for a flexible sigmoidoscopy or every 10 years for a colonoscopy beginning at age 68 years and continuing until age 83 years.  Hepatitis C blood test.** / For all people born from [birth year] through 1964 and any individual with known risks for hepatitis C.  Skin self-exam. / Monthly.  Influenza vaccine. / Every year.  Tetanus, diphtheria, and acellular pertussis (Tdap/Td) vaccine.** / Consult your health care provider. Pregnant women should receive 1 dose of Tdap vaccine during each pregnancy. 1 dose of Td every 10 years.  Varicella vaccine.** / Consult your health care provider. Pregnant females who do not have evidence of immunity should receive the first dose after pregnancy.  Zoster vaccine.** / 1 dose for adults aged 68 years or older.  Measles, mumps, rubella (MMR) vaccine.** / You need at least 1 dose of MMR if you were born in 1956 or later. You may also need a 2nd dose. For females of childbearing age, rubella immunity should be determined. If there is no evidence of immunity, females who are not pregnant should be vaccinated. If there is no evidence of immunity, females who are pregnant should delay immunization until after pregnancy.  Pneumococcal 13-valent conjugate (PCV13) vaccine.** / Consult your health care provider.  Pneumococcal polysaccharide (PPSV23) vaccine.** / 1 to 2 doses if you smoke cigarettes or if you have certain conditions.  Meningococcal vaccine.** /  Consult your health care provider.  Hepatitis A vaccine.** / Consult your health care provider.  Hepatitis B vaccine.** / Consult your health care provider.  Haemophilus influenzae type b (Hib) vaccine.** / Consult your health care provider. Ages 57 years and over  Blood pressure check.** / Every 1 to 2 years.  Lipid and cholesterol check.** / Every 5 years beginning at age 9 years.  Lung cancer screening. / Every year if you are aged 66-80 years and have a 30-pack-year history of smoking and currently  smoke or have quit within the past 15 years. Yearly screening is stopped once you have quit smoking for at least 15 years or develop a health problem that would prevent you from having lung cancer treatment.  Clinical breast exam.** / Every year after age 57 years.  BRCA-related cancer risk assessment.** / For women who have family members with a BRCA-related cancer (breast, ovarian, tubal, or peritoneal cancers).  Mammogram.** / Every year beginning at age 92 years and continuing for as long as you are in good health. Consult with your health care provider.  Pap test.** / Every 3 years starting at age 43 years through age 71 or 9 years with 3 consecutive normal Pap tests. Testing can be stopped between 65 and 70 years with 3 consecutive normal Pap tests and no abnormal Pap or HPV tests in the past 10 years.  HPV screening.** / Every 3 years from ages 47 years through ages 61 or 94 years with a history of 3 consecutive normal Pap tests. Testing can be stopped between 65 and 70 years with 3 consecutive normal Pap tests and no abnormal Pap or HPV tests in the past 10 years.  Fecal occult blood test (FOBT) of stool. / Every year beginning at age 65 years and continuing until age 79 years. You may not need to do this test if you get a colonoscopy every 10 years.  Flexible sigmoidoscopy or colonoscopy.** / Every 5 years for a flexible sigmoidoscopy or every 10 years for a colonoscopy beginning at age 13 years and continuing until age 45 years.  Hepatitis C blood test.** / For all people born from [birth year] through 1964 and any individual with known risks for hepatitis C.  Osteoporosis screening.** / A one-time screening for women ages 65 years and over and women at risk for fractures or osteoporosis.  Skin self-exam. / Monthly.  Influenza vaccine. / Every year.  Tetanus, diphtheria, and acellular pertussis (Tdap/Td) vaccine.** / 1 dose of Td every 10 years.  Varicella vaccine.** / Consult your  health care provider.  Zoster vaccine.** / 1 dose for adults aged 23 years or older.  Pneumococcal 13-valent conjugate (PCV13) vaccine.** / Consult your health care provider.  Pneumococcal polysaccharide (PPSV23) vaccine.** / 1 dose for all adults aged 53 years and older.  Meningococcal vaccine.** / Consult your health care provider.  Hepatitis A vaccine.** / Consult your health care provider.  Hepatitis B vaccine.** / Consult your health care provider.  Haemophilus influenzae type b (Hib) vaccine.** / Consult your health care provider. ** Family history and personal history of risk and conditions may change your health care provider's recommendations. Document Released: 07/18/2001 Document Revised: 10/06/2013 Document Reviewed: 10/17/2010 [facility] Patient Information 2015 [place], [place]. This information is not intended to replace advice given to you by your health care provider. Make sure you discuss any questions you have with your health care provider.

## 2014-10-30 NOTE — Addendum Note (Signed)
Addended by: Eustace QuailEABOLD, Katharine Rochefort J on: 10/30/2014 01:09 PM   Modules accepted: Orders

## 2014-10-30 NOTE — Progress Notes (Signed)
Patient presents to clinic today for annual exam.  Patient is fasting for labs.  Acute Concerns: Patient endorses increased urinary frequency and suprapubic pressure. Has been going on intermittently over the past 5 months.  Denies dysuria, hematuria, flank pain, nausea or vomiting. Is more concerned about diabetes as she has a large family history of diabetes.   Chronic Issues: Muscle Spasms -- lower legs and worsening over the past few months.  Previously workup has been unremarkable. Has been on muscle relaxants previously without relief of symptoms.  Endorses spasms and tightness are lasting 10 minutes at a time.  Health Maintenance: Dental -- up-to-date Vision -- up-to-date Immunizations -- Due for Tetanus.  Will be getting from office today. Mammogram -- June 2015. PAP -- up-to-date  Past Medical History  Diagnosis Date  . GERD (gastroesophageal reflux disease)   . Vitamin D deficiency   . Chicken pox   . Seasonal allergies   . Environmental allergies   . Glaucoma (increased eye pressure)     Borderline    Past Surgical History  Procedure Laterality Date  . Cesarean section    . Abdominal hysterectomy    . Wisdom tooth extraction      Current Outpatient Prescriptions on File Prior to Visit  Medication Sig Dispense Refill  . aspirin EC 81 MG tablet Take 81 mg by mouth daily as needed.     Marland Kitchen co-enzyme Q-10 50 MG capsule Take 100 mg by mouth.    . cyclobenzaprine (FLEXERIL) 10 MG tablet Take 1 tablet (10 mg total) by mouth 3 (three) times daily as needed for muscle spasms. 30 tablet 0  . fish oil-omega-3 fatty acids 1000 MG capsule Take 1 g by mouth every other day.     . fluticasone (FLONASE) 50 MCG/ACT nasal spray Place 2 sprays into both nostrils daily. 16 g 6  . loratadine (CLARITIN) 10 MG tablet Take 10 mg by mouth daily as needed.     . magnesium hydroxide (MILK OF MAGNESIA) 800 MG/5ML suspension Take 30 mLs by mouth daily as needed.     . Multiple Vitamin  (MULTIVITAMIN) tablet Take 1 tablet by mouth daily.       No current facility-administered medications on file prior to visit.    Allergies  Allergen Reactions  . Codeine Nausea And Vomiting  . Prednisone Nausea And Vomiting    Family History  Problem Relation Age of Onset  . Hypertension Mother     Living  . Stroke Mother     multiple  . COPD Mother   . COPD Father 35    Deceased  . Bone cancer Paternal Grandmother   . Diabetes Mother   . Glaucoma Maternal Aunt   . Hypertension Brother     x3  . Heart disease Brother     x1  . Asthma Son   . Allergies Son   . Eczema Son     x1  . Eczema Daughter     x3    History   Social History  . Marital Status: Legally Separated    Spouse Name: N/A  . Number of Children: N/A  . Years of Education: N/A   Occupational History  . Not on file.   Social History Main Topics  . Smoking status: Never Smoker   . Smokeless tobacco: Never Used  . Alcohol Use: Yes     Comment: rare  . Drug Use: No  . Sexual Activity: Not on file   Other  Topics Concern  . Not on file   Social History Narrative   Review of Systems  Constitutional: Negative for fever and weight loss.  HENT: Negative for ear discharge, ear pain, hearing loss and tinnitus.   Eyes: Negative for blurred vision, double vision, photophobia and pain.  Respiratory: Negative for cough and shortness of breath.   Cardiovascular: Negative for chest pain and palpitations.  Gastrointestinal: Negative for heartburn, nausea, vomiting, abdominal pain, diarrhea, constipation, blood in stool and melena.  Genitourinary: Positive for frequency. Negative for dysuria, urgency, hematuria and flank pain.  Musculoskeletal: Positive for myalgias. Negative for falls.  Neurological: Negative for dizziness, loss of consciousness and headaches.  Endo/Heme/Allergies: Negative for environmental allergies.  Psychiatric/Behavioral: Negative for depression, suicidal ideas, hallucinations and  substance abuse. The patient is not nervous/anxious and does not have insomnia.    BP 127/74 mmHg  Pulse 84  Temp(Src) 98.2 F (36.8 C) (Oral)  Ht 5\' 3"  (1.6 m)  Wt 148 lb 9.6 oz (67.405 kg)  BMI 26.33 kg/m2  SpO2 100%  Physical Exam  Constitutional: She is oriented to person, place, and time and well-developed, well-nourished, and in no distress.  HENT:  Head: Normocephalic and atraumatic.  Right Ear: Tympanic membrane, external ear and ear canal normal.  Left Ear: Tympanic membrane, external ear and ear canal normal.  Nose: Nose normal. No mucosal edema.  Mouth/Throat: Uvula is midline, oropharynx is clear and moist and mucous membranes are normal. No oropharyngeal exudate or posterior oropharyngeal erythema.  Eyes: Conjunctivae are normal. Pupils are equal, round, and reactive to light.  Neck: Neck supple. No thyromegaly present.  Cardiovascular: Normal rate, regular rhythm, normal heart sounds and intact distal pulses.   Pulmonary/Chest: Effort normal and breath sounds normal. No respiratory distress. She has no wheezes. She has no rales. She exhibits no tenderness.  Abdominal: Soft. Bowel sounds are normal. She exhibits no distension and no mass. There is no tenderness. There is no rebound and no guarding.  Genitourinary:  Patient defers to GYN.  Lymphadenopathy:    She has no cervical adenopathy.  Neurological: She is alert and oriented to person, place, and time. No cranial nerve deficit.  Skin: Skin is warm and dry. No rash noted.  Psychiatric: Affect normal.  Vitals reviewed.  Assessment/Plan: Visit for preventive health examination Depression screen negative.  Health Maintenance reviewed. TDaP given today. Preventive schedule discussed.  Up-to-date on PAP.  Is followed by OB/GYN. Will obtain fasting labs today.  Handout given in AVS.   Muscle cramps Reiterated use of Diltiazem.  Will take daily for 2 week trial.  Will recheck BMP and CBC as well as TSH  today.   Urinary frequency Previous workup negative.  Will check UA, BMP and A1C.  Will treat based on results. If all unremarkable will refer to Urology.

## 2014-10-31 ENCOUNTER — Other Ambulatory Visit: Payer: Self-pay | Admitting: Physician Assistant

## 2014-11-03 ENCOUNTER — Telehealth: Payer: Self-pay | Admitting: Physician Assistant

## 2014-11-03 NOTE — Telephone Encounter (Signed)
Mailed lab results to the pt.//AB/CMA

## 2014-11-03 NOTE — Telephone Encounter (Signed)
Requesting Flexeril 10mg -Take 1 tablet by mouth three times daily as needed for muscle spasm. Last refill:09-18-13;#30,0 Last OV:10-30-14 Please advise.//AB/CMA

## 2014-11-03 NOTE — Telephone Encounter (Signed)
Relation to ZO:XWRUpt:self Call back number:[phone] Pharmacy:  Reason for call:  Pt requesting lab results please mail results to home.

## 2014-11-10 ENCOUNTER — Telehealth: Payer: Self-pay | Admitting: *Deleted

## 2014-11-10 MED ORDER — ROSUVASTATIN CALCIUM 10 MG PO TABS: 10.0000 mg | ORAL_TABLET | Freq: Every day | ORAL | Status: DC

## 2014-11-10 NOTE — Telephone Encounter (Signed)
Patient Unavailable; gave permission to speak with daughter RE: new medication; informed understood & agreed to forward information to patient; Rx to pharmacy/SLS

## 2014-11-10 NOTE — Telephone Encounter (Signed)
      Drug-Drug Interaction Report Diltiazem / Statins Significance: Major  Warning: Plasma concentrations and pharmacologic effects of simvastatin may be increased by co-administration of diltiazem. The risk of myopathy and rhabdomyolysis may be increased. Specific maximum dose recommendations exist when simvastatin and diltiazem are coadministered according to official package labeling.  Onset: Delayed Document Level: Probable  Interacting Medications/Orders:  Diltiazem Oral or Non-Oral, Systemic Statins Oral or Non-Oral, Systemic  1. diltiazem Order ([id number]): diltiazem (CARDIZEM) 30 MG tablet Route: Oral Start: 10/30/2014 End: none Frequency: Daily at bedtime 1. simvastatin Order: simvastatin (ZOCOR) 10 MG tablet Route: Oral Start: 11/10/2014 End: none Frequency: Daily-1800   Management Code: Professional intervention required  Effects: Plasma concentrations and pharmacologic effects of simvastatin may be increased by co-administration of diltiazem. The risk of myopathy and rhabdomyolysis may be increased. Specific maximum dose recommendations exist when simvastatin and diltiazem are coadministered according to official package labeling.  Mechanism: Inhibition of CYP3A4 by diltiazem may decrease the metabolic elimination of simvastatin.  Management: If muscle aches and weakness develop, monitor concentrations of creatine phosphokinase, follow renal function, and stop diltiazem or simvastatin as indicated. If simvastatin is the statin being used in a patient receiving diltiazem, the maximum daily dose of simvastatin should not exceed 10 mg and the maximum daily dose of diltiazem should not exceed 240 mg according to the official package labeling. If lovastatin is the statin being used in a patient receiving diltiazem, the maximum daily dose of lovastatin should not exceed 20 mg. On the other hand, pravastatin, fluvastatin, pitavastatin or rosuvastatin may be an acceptable alternative  to simvastatin in patients receiving diltiazem.   Please Advise if change of medication is needed/SLS

## 2014-11-10 NOTE — Telephone Encounter (Signed)
Notes Recorded by Verdie ShireAngela M Baynes, CMA on 11/06/2014 at 1:57 PM Called and LM with a young lady to have the pt to RTC this is regarding her recent lab results.//AB/CMA Notes Recorded by Waldon MerlWilliam C Martin, PA-C on 10/30/2014 at 2:43 PM Labs good overall. Cholesterol elevated to point where we should consider medication. Would like to start lipitor 10 mg daily if she is willing. Minimize foods high in cholesterol and saturated fats. Ok to send in medication if agreeable. Follow-up 2 months.  Patient informed, understood & request a different medication [she does not want to take Lipitor d/t her other having severe reaction], any alternative is fine with her/SLS Please Advise and I will relay message to patient.

## 2014-11-10 NOTE — Telephone Encounter (Signed)
Ok to send in rosuvastatin 10 mg daily.

## 2014-11-10 NOTE — Telephone Encounter (Signed)
Ok to send in simvastatin 10 mg daily. Quant 30 with 1 refill.  Have her follow-up in 6-8 weeks

## 2014-11-11 ENCOUNTER — Telehealth: Payer: Self-pay | Admitting: Physician Assistant

## 2014-11-11 MED ORDER — LOVASTATIN 20 MG PO TABS: 20.0000 mg | ORAL_TABLET | Freq: Every day | ORAL | Status: DC

## 2014-11-11 NOTE — Telephone Encounter (Signed)
Spoke with caller [facility][Mary grace] and informed her that Rx was sent as physician reviewed on the drug interaction, as the dosage px was under the interaction amount suggested/SLS

## 2014-11-11 NOTE — Telephone Encounter (Signed)
Caller name: Kimberly Relation to pt: self Call back number:  Pharmacy: [facility] [address]  Reason for call:   States that brand and generic for crestor is too expensive for her. Is requesting lovastatin? Call at home, ok to leave a message with daughter

## 2014-11-11 NOTE — Telephone Encounter (Signed)
Ok to send in lovastatin 20 mg.  Take one by mouth daily. Please send in and call patient.

## 2014-11-11 NOTE — Telephone Encounter (Signed)
[facility]  Call back number: [phone]   Reason for call:   As per pharmacy there will be a medication interaction with lovastatin (MEVACOR)  while the pt is currently on diltiazem (CARDIZEM).

## 2014-11-20 ENCOUNTER — Ambulatory Visit: Payer: BLUE CROSS/BLUE SHIELD | Admitting: Physician Assistant

## 2014-11-27 ENCOUNTER — Encounter: Payer: Self-pay | Admitting: Physician Assistant

## 2014-11-27 ENCOUNTER — Ambulatory Visit (INDEPENDENT_AMBULATORY_CARE_PROVIDER_SITE_OTHER): Payer: BLUE CROSS/BLUE SHIELD | Admitting: Physician Assistant

## 2014-11-27 VITALS — BP 123/83 | HR 91 | Temp 98.1°F | Ht 63.0 in | Wt 147.8 lb

## 2014-11-27 DIAGNOSIS — E785 Hyperlipidemia, unspecified: Secondary | ICD-10-CM | POA: Diagnosis not present

## 2014-11-27 DIAGNOSIS — R252 Cramp and spasm: Secondary | ICD-10-CM

## 2014-11-27 LAB — LIPID PANEL
CHOL/HDL RATIO: 4
Cholesterol: 226 mg/dL — ABNORMAL HIGH (ref 0–200)
HDL: 50.5 mg/dL (ref >39.00)
LDL Cholesterol: 163 mg/dL — ABNORMAL HIGH (ref 0–99)
NONHDL: 175.5
Triglycerides: 65 mg/dL (ref 0.0–149.0)
VLDL: 13 mg/dL (ref 0.0–40.0)

## 2014-11-27 MED ORDER — CLOBETASOL PROPIONATE 0.05 % EX CREA: 1.0000 "application " | TOPICAL_CREAM | Freq: Two times a day (BID) | CUTANEOUS | Status: DC

## 2014-11-27 MED ORDER — METHOCARBAMOL 500 MG PO TABS: 500.0000 mg | ORAL_TABLET | Freq: Every evening | ORAL | Status: DC

## 2014-11-27 NOTE — Assessment & Plan Note (Signed)
Tolerating stain without myalgias. Continue same.  Will check fasting lipid panel today.

## 2014-11-27 NOTE — Progress Notes (Signed)
Pre visit review using our clinic review tool, if applicable. No additional management support is needed unless otherwise documented below in the visit note. 

## 2014-11-27 NOTE — Patient Instructions (Signed)
Please go to the lab for blood work.  I will call you with your results. Please continue medications as directed.  Start the Robaxin nightly.  Please switch your Flonase to morning dosing.  Start a daily claritin.   Follow-up will be based on your lab results.

## 2014-11-27 NOTE — Progress Notes (Signed)
Patient presents to clinic today for follow-up of muscle cramping after starting Diltiazem daily at low dose. Previous workup negative for cause of symptoms.  Patient endorses marked improvement in leg cramps, stating they have resolved.  Back cramps still present but only at bedtime.  Patient also here for cholesterol reassessment on new daily medication.  Patient endorses taking her lovastatin daily as directed. Denies myalgias.   Past Medical History  Diagnosis Date  . GERD (gastroesophageal reflux disease)   . Vitamin D deficiency   . Chicken pox   . Seasonal allergies   . Environmental allergies   . Glaucoma (increased eye pressure)     Borderline    Current Outpatient Prescriptions on File Prior to Visit  Medication Sig Dispense Refill  . aspirin EC 81 MG tablet Take 81 mg by mouth daily as needed.     . diltiazem (CARDIZEM) 30 MG tablet Take 1 tablet (30 mg total) by mouth at bedtime. 30 tablet 0  . fish oil-omega-3 fatty acids 1000 MG capsule Take 1 g by mouth every other day.     . fluticasone (FLONASE) 50 MCG/ACT nasal spray Place 2 sprays into both nostrils daily. 16 g 6  . loratadine (CLARITIN) 10 MG tablet Take 10 mg by mouth daily as needed.     . lovastatin (MEVACOR) 20 MG tablet Take 1 tablet (20 mg total) by mouth at bedtime. 30 tablet 3  . magnesium hydroxide (MILK OF MAGNESIA) 800 MG/5ML suspension Take 30 mLs by mouth daily as needed.     . Multiple Vitamin (MULTIVITAMIN) tablet Take 1 tablet by mouth daily.      . ranitidine (ZANTAC) 150 MG tablet Take 1 tablet (150 mg total) by mouth 2 (two) times daily. 60 tablet 3  . co-enzyme Q-10 50 MG capsule Take 100 mg by mouth.     No current facility-administered medications on file prior to visit.    Allergies  Allergen Reactions  . Codeine Nausea And Vomiting  . Prednisone Nausea And Vomiting    Family History  Problem Relation Age of Onset  . Hypertension Mother     Living  . Stroke Mother     multiple    . COPD Mother   . COPD Father 74    Deceased  . Bone cancer Paternal Grandmother   . Diabetes Mother   . Glaucoma Maternal Aunt   . Hypertension Brother     x3  . Heart disease Brother     x1  . Asthma Son   . Allergies Son   . Eczema Son     x1  . Eczema Daughter     x3    History   Social History  . Marital Status: Legally Separated    Spouse Name: N/A  . Number of Children: N/A  . Years of Education: N/A   Social History Main Topics  . Smoking status: Never Smoker   . Smokeless tobacco: Never Used  . Alcohol Use: Yes     Comment: rare  . Drug Use: No  . Sexual Activity: Not on file   Other Topics Concern  . None   Social History Narrative   Review of Systems - See HPI.  All other ROS are negative.  BP 123/83 mmHg  Pulse 91  Temp(Src) 98.1 F (36.7 C) (Oral)  Ht 5\' 3"  (1.6 m)  Wt 147 lb 12.8 oz (67.042 kg)  BMI 26.19 kg/m2  SpO2 100%  Physical Exam  Constitutional: She  is oriented to person, place, and time and well-developed, well-nourished, and in no distress.  HENT:  Head: Normocephalic and atraumatic.  Cardiovascular: Normal rate, regular rhythm, normal heart sounds and intact distal pulses.   Pulmonary/Chest: Effort normal and breath sounds normal. No respiratory distress. She has no wheezes. She has no rales. She exhibits no tenderness.  Neurological: She is alert and oriented to person, place, and time.  Skin: Skin is warm and dry. No rash noted.  Psychiatric: Affect normal.  Vitals reviewed.   Recent Results (from the past 2160 hour(s))  Basic metabolic panel     Status: None   Collection Time: 10/30/14  8:27 AM  Result Value Ref Range   Sodium 141 135 - 145 mEq/L   Potassium 3.8 3.5 - 5.1 mEq/L   Chloride 106 96 - 112 mEq/L   CO2 28 19 - 32 mEq/L   Glucose, Bld 92 70 - 99 mg/dL   BUN 14 6 - 23 mg/dL   Creatinine, Ser 4.09 0.40 - 1.20 mg/dL   Calcium 9.1 8.4 - 81.1 mg/dL   GFR 91.47 >82.95 mL/min  CBC     Status: Abnormal    Collection Time: 10/30/14  8:27 AM  Result Value Ref Range   WBC 6.7 4.0 - 10.5 K/uL   RBC 4.14 3.87 - 5.11 Mil/uL   Platelets 365.0 150.0 - 400.0 K/uL   Hemoglobin 11.9 (L) 12.0 - 15.0 g/dL   HCT 62.1 (L) 30.8 - 65.7 %   MCV 86.4 78.0 - 100.0 fl   MCHC 33.2 30.0 - 36.0 g/dL   RDW 84.6 96.2 - 95.2 %  Hemoglobin A1c     Status: None   Collection Time: 10/30/14  8:27 AM  Result Value Ref Range   Hgb A1c MFr Bld 5.6 4.6 - 6.5 %    Comment: Glycemic Control Guidelines for People with Diabetes:Non Diabetic:  <6%Goal of Therapy: <7%Additional Action Suggested:  >8%   Hepatic function panel     Status: None   Collection Time: 10/30/14  8:27 AM  Result Value Ref Range   Total Bilirubin 0.5 0.2 - 1.2 mg/dL   Bilirubin, Direct 0.1 0.0 - 0.3 mg/dL   Alkaline Phosphatase 79 39 - 117 U/L   AST 15 0 - 37 U/L   ALT 14 0 - 35 U/L   Total Protein 7.2 6.0 - 8.3 g/dL   Albumin 4.1 3.5 - 5.2 g/dL  Lipid panel     Status: Abnormal   Collection Time: 10/30/14  8:27 AM  Result Value Ref Range   Cholesterol 233 (H) 0 - 200 mg/dL    Comment: ATP III Classification       Desirable:  < 200 mg/dL               Borderline High:  200 - 239 mg/dL          High:  > = 841 mg/dL   Triglycerides 32.4 0.0 - 149.0 mg/dL    Comment: Normal:  <401 mg/dLBorderline High:  150 - 199 mg/dL   HDL 02.72 >53.66 mg/dL   VLDL 44.0 0.0 - 34.7 mg/dL   LDL Cholesterol 425 (H) 0 - 99 mg/dL   Total CHOL/HDL Ratio 5     Comment:                Men          Women1/2 Average Risk     3.4  3.3Average Risk          5.0          4.42X Average Risk          9.6          7.13X Average Risk          15.0          11.0                       NonHDL 181.70     Comment: NOTE:  Non-HDL goal should be 30 mg/dL higher than patient's LDL goal (i.e. LDL goal of < 70 mg/dL, would have non-HDL goal of < 100 mg/dL)  TSH     Status: None   Collection Time: 10/30/14  8:27 AM  Result Value Ref Range   TSH 1.80 0.35 - 4.50 uIU/mL  Urinalysis,  Routine w reflex microscopic (not at [facility])     Status: Abnormal   Collection Time: 10/30/14  8:27 AM  Result Value Ref Range   Color, Urine YELLOW Yellow;Lt. Yellow   APPearance CLEAR Clear   Specific Gravity, Urine >=1.030 (A) 1.000 - 1.030   pH 6.0 5.0 - 8.0   Total Protein, Urine NEGATIVE Negative   Urine Glucose NEGATIVE Negative   Ketones, ur NEGATIVE Negative   Bilirubin Urine NEGATIVE Negative   Hgb urine dipstick TRACE-LYSED (A) Negative   Urobilinogen, UA 0.2 0.0 - 1.0   Leukocytes, UA NEGATIVE Negative   Nitrite NEGATIVE Negative   WBC, UA 0-2/hpf 0-2/hpf   RBC / HPF 0-2/hpf 0-2/hpf  Hepatitis, Acute     Status: None   Collection Time: 10/30/14  1:09 PM  Result Value Ref Range   Hepatitis B Surface Ag NEGATIVE NEGATIVE   HCV Ab NEGATIVE NEGATIVE   Hep B C IgM NON REACTIVE NON REACTIVE    Comment: High levels of Hepatitis B Core IgM antibody are detectable during the acute stage of Hepatitis B. This antibody is used to differentiate current from past HBV infection.      Hep A IgM NON REACTIVE NON REACTIVE    Comment:   Effective April 20, 2014, Hepatitis Acute Panel (test code 16109) will be revised to automatically reflex to the Hepatitis C Viral RNA, Quantitative, Real-Time PCR assay if the Hepatitis C antibody screening result is Reactive. This action is being taken to ensure that the CDC/USPSTF recommended HCV diagnostic algorithm with the appropriate test reflex needed for accurate interpretation is followed.       Assessment/Plan: Muscle cramps Markedly improved.  Still with some back spasm at bedtime.  BP normotensive. Continue Cardizem. Will add on bedtime Robaxin.  Supportive measures reviewed.  Hyperlipidemia Tolerating stain without myalgias. Continue same.  Will check fasting lipid panel today.

## 2014-11-27 NOTE — Assessment & Plan Note (Signed)
Markedly improved.  Still with some back spasm at bedtime.  BP normotensive. Continue Cardizem. Will add on bedtime Robaxin.  Supportive measures reviewed.

## 2014-12-09 ENCOUNTER — Other Ambulatory Visit: Payer: Self-pay | Admitting: Physician Assistant

## 2015-07-16 ENCOUNTER — Ambulatory Visit (INDEPENDENT_AMBULATORY_CARE_PROVIDER_SITE_OTHER): Payer: BLUE CROSS/BLUE SHIELD | Admitting: Physician Assistant

## 2015-07-16 ENCOUNTER — Encounter: Payer: Self-pay | Admitting: Physician Assistant

## 2015-07-16 VITALS — BP 164/93 | HR 88 | Temp 97.9°F | Ht 63.0 in | Wt 144.2 lb

## 2015-07-16 DIAGNOSIS — M546 Pain in thoracic spine: Secondary | ICD-10-CM | POA: Diagnosis not present

## 2015-07-16 DIAGNOSIS — J019 Acute sinusitis, unspecified: Secondary | ICD-10-CM | POA: Diagnosis not present

## 2015-07-16 DIAGNOSIS — M545 Low back pain: Secondary | ICD-10-CM

## 2015-07-16 DIAGNOSIS — B9689 Other specified bacterial agents as the cause of diseases classified elsewhere: Secondary | ICD-10-CM

## 2015-07-16 MED ORDER — FLUTICASONE PROPIONATE 50 MCG/ACT NA SUSP: 2.0000 | Freq: Every day | NASAL | Status: DC

## 2015-07-16 MED ORDER — AMOXICILLIN-POT CLAVULANATE 875-125 MG PO TABS: 1.0000 | ORAL_TABLET | Freq: Two times a day (BID) | ORAL | Status: DC

## 2015-07-16 MED ORDER — MELOXICAM 15 MG PO TABS: 15.0000 mg | ORAL_TABLET | Freq: Every day | ORAL | Status: DC

## 2015-07-16 MED ORDER — LOVASTATIN 20 MG PO TABS: 20.0000 mg | ORAL_TABLET | Freq: Every day | ORAL | Status: DC

## 2015-07-16 NOTE — Patient Instructions (Signed)
For back pain -- no heavy lifting. Take the Mobic once daily with food. Continue muscle relaxer. Avoid heavy lifting. Use heating pad but only in 10 minute intervals. Follow-up if symptoms are not resolving.  Please take antibiotic as directed.  Increase fluid intake.  Use Saline nasal spray.  Take a daily multivitamin. Use Flonase as directed.  Place a humidifier in the bedroom.  Please call or return clinic if symptoms are not improving.  Sinusitis Sinusitis is redness, soreness, and swelling (inflammation) of the paranasal sinuses. Paranasal sinuses are air pockets within the bones of your face (beneath the eyes, the middle of the forehead, or above the eyes). In healthy paranasal sinuses, mucus is able to drain out, and air is able to circulate through them by way of your nose. However, when your paranasal sinuses are inflamed, mucus and air can become trapped. This can allow bacteria and other germs to grow and cause infection. Sinusitis can develop quickly and last only a short time (acute) or continue over a long period (chronic). Sinusitis that lasts for more than 12 weeks is considered chronic.  CAUSES  Causes of sinusitis include:  Allergies.  Structural abnormalities, such as displacement of the cartilage that separates your nostrils (deviated septum), which can decrease the air flow through your nose and sinuses and affect sinus drainage.  Functional abnormalities, such as when the small hairs (cilia) that line your sinuses and help remove mucus do not work properly or are not present. SYMPTOMS  Symptoms of acute and chronic sinusitis are the same. The primary symptoms are pain and pressure around the affected sinuses. Other symptoms include:  Upper toothache.  Earache.  Headache.  Bad breath.  Decreased sense of smell and taste.  A cough, which worsens when you are lying flat.  Fatigue.  Fever.  Thick drainage from your nose, which often is green and may contain pus  (purulent).  Swelling and warmth over the affected sinuses. DIAGNOSIS  Your caregiver will perform a physical exam. During the exam, your caregiver may:  Look in your nose for signs of abnormal growths in your nostrils (nasal polyps).  Tap over the affected sinus to check for signs of infection.  View the inside of your sinuses (endoscopy) with a special imaging device with a light attached (endoscope), which is inserted into your sinuses. If your caregiver suspects that you have chronic sinusitis, one or more of the following tests may be recommended:  Allergy tests.  Nasal culture A sample of mucus is taken from your nose and sent to a lab and screened for bacteria.  Nasal cytology A sample of mucus is taken from your nose and examined by your caregiver to determine if your sinusitis is related to an allergy. TREATMENT  Most cases of acute sinusitis are related to a viral infection and will resolve on their own within 10 days. Sometimes medicines are prescribed to help relieve symptoms (pain medicine, decongestants, nasal steroid sprays, or saline sprays).  However, for sinusitis related to a bacterial infection, your caregiver will prescribe antibiotic medicines. These are medicines that will help kill the bacteria causing the infection.  Rarely, sinusitis is caused by a fungal infection. In theses cases, your caregiver will prescribe antifungal medicine. For some cases of chronic sinusitis, surgery is needed. Generally, these are cases in which sinusitis recurs more than 3 times per year, despite other treatments. HOME CARE INSTRUCTIONS   Drink plenty of water. Water helps thin the mucus so your sinuses can drain  more easily.  Use a humidifier.  Inhale steam 3 to 4 times a day (for example, sit in the bathroom with the shower running).  Apply a warm, moist washcloth to your face 3 to 4 times a day, or as directed by your caregiver.  Use saline nasal sprays to help moisten and  clean your sinuses.  Take over-the-counter or prescription medicines for pain, discomfort, or fever only as directed by your caregiver. SEEK IMMEDIATE MEDICAL CARE IF:  You have increasing pain or severe headaches.  You have nausea, vomiting, or drowsiness.  You have swelling around your face.  You have vision problems.  You have a stiff neck.  You have difficulty breathing. MAKE SURE YOU:   Understand these instructions.  Will watch your condition.  Will get help right away if you are not doing well or get worse. Document Released: 05/22/2005 Document Revised: 08/14/2011 Document Reviewed: 06/06/2011 [facility] Patient Information 2014 [place], [place].

## 2015-07-16 NOTE — Progress Notes (Signed)
Patient presents to clinic today c/o thoracic and low back pain starting 1 week ago and gradually worsening. Denies numbness or tingling. Denies trauma or injury. Has taken Flexeril with only some relief in symptoms.  Patient endorses sinus pressure with headache and ear pressure and  ear pain x 1 week. Notes some blood in R ear canal 2 days ago.  Denies fever, chills, cough. Endorses sore throat. Denies recent travel or sick contact.  Past Medical History  Diagnosis Date  . GERD (gastroesophageal reflux disease)   . Vitamin D deficiency   . Chicken pox   . Seasonal allergies   . Environmental allergies   . Glaucoma (increased eye pressure)     Borderline    Current Outpatient Prescriptions on File Prior to Visit  Medication Sig Dispense Refill  . aspirin EC 81 MG tablet Take 81 mg by mouth daily as needed.     . clobetasol cream (TEMOVATE) 0.05 % Apply 1 application topically 2 (two) times daily. 30 g 0  . co-enzyme Q-10 50 MG capsule Take 100 mg by mouth.    . fish oil-omega-3 fatty acids 1000 MG capsule Take 1 g by mouth every other day.     . loratadine (CLARITIN) 10 MG tablet Take 10 mg by mouth daily as needed.     . magnesium hydroxide (MILK OF MAGNESIA) 800 MG/5ML suspension Take 30 mLs by mouth daily as needed.     . methocarbamol (ROBAXIN) 500 MG tablet Take 1 tablet (500 mg total) by mouth every evening. 30 tablet 1  . Multiple Vitamin (MULTIVITAMIN) tablet Take 1 tablet by mouth daily.      . ranitidine (ZANTAC) 150 MG tablet Take 1 tablet (150 mg total) by mouth 2 (two) times daily. 60 tablet 3   No current facility-administered medications on file prior to visit.    Allergies  Allergen Reactions  . Codeine Nausea And Vomiting  . Prednisone Nausea And Vomiting    Family History  Problem Relation Age of Onset  . Hypertension Mother     Living  . Stroke Mother     multiple  . COPD Mother   . COPD Father 34    Deceased  . Bone cancer Paternal Grandmother     . Diabetes Mother   . Glaucoma Maternal Aunt   . Hypertension Brother     x3  . Heart disease Brother     x1  . Asthma Son   . Allergies Son   . Eczema Son     x1  . Eczema Daughter     x3    Social History   Social History  . Marital Status: Legally Separated    Spouse Name: N/A  . Number of Children: N/A  . Years of Education: N/A   Social History Main Topics  . Smoking status: Never Smoker   . Smokeless tobacco: Never Used  . Alcohol Use: Yes     Comment: rare  . Drug Use: No  . Sexual Activity: Not Asked   Other Topics Concern  . None   Social History Narrative   Review of Systems - See HPI.  All other ROS are negative.  BP 164/93 mmHg  Pulse 88  Temp(Src) 97.9 F (36.6 C) (Oral)  Ht  (1.6 m)  Wt 144 lb 3.2 oz (65.409 kg)  BMI 25.55 kg/m2  SpO2 100%  Physical Exam  Constitutional: She is oriented to person, place, and time.  HENT:  Head: Normocephalic  and atraumatic.  Right Ear: Tympanic membrane normal.  Left Ear: Tympanic membrane normal.  Nose: Right sinus exhibits maxillary sinus tenderness. Left sinus exhibits maxillary sinus tenderness.  Mouth/Throat: Uvula is midline, oropharynx is clear and moist and mucous membranes are normal.  Cardiovascular: Normal rate, regular rhythm, normal heart sounds and intact distal pulses.   Pulmonary/Chest: Effort normal and breath sounds normal. No respiratory distress. She has no wheezes. She has no rales. She exhibits no tenderness.  Musculoskeletal:       Thoracic back: She exhibits tenderness. She exhibits no bony tenderness.       Lumbar back: She exhibits tenderness and spasm. She exhibits no bony tenderness.  Neurological: She is alert and oriented to person, place, and time.  Skin: Skin is warm and dry.  Vitals reviewed.   No results found for this or any previous visit (from the past 2160 hour(s)).  Assessment/Plan: Acute bacterial sinusitis Rx Augmentin.  Increase fluids.  Rest.  Saline  nasal spray.  Probiotic.  Mucinex as directed.  Humidifier in bedroom.  Call or return to clinic if symptoms are not improving.   Low back pain Continue Flexeril as directed. Rx Mobic. Supportive measures reviewed. Follow-up if not resolving.

## 2015-07-16 NOTE — Progress Notes (Signed)
Pre visit review using our clinic review tool, if applicable. No additional management support is needed unless otherwise documented below in the visit note. 

## 2015-07-19 DIAGNOSIS — M545 Low back pain, unspecified: Secondary | ICD-10-CM | POA: Insufficient documentation

## 2015-07-19 DIAGNOSIS — G8929 Other chronic pain: Secondary | ICD-10-CM | POA: Insufficient documentation

## 2015-07-19 DIAGNOSIS — B9689 Other specified bacterial agents as the cause of diseases classified elsewhere: Secondary | ICD-10-CM | POA: Insufficient documentation

## 2015-07-19 DIAGNOSIS — J019 Acute sinusitis, unspecified: Principal | ICD-10-CM

## 2015-07-19 NOTE — Assessment & Plan Note (Signed)
Continue Flexeril as directed. Rx Mobic. Supportive measures reviewed. Follow-up if not resolving.

## 2015-07-19 NOTE — Assessment & Plan Note (Signed)
Rx Augmentin.  Increase fluids.  Rest.  Saline nasal spray.  Probiotic.  Mucinex as directed.  Humidifier in bedroom.  Call or return to clinic if symptoms are not improving.  

## 2015-08-26 ENCOUNTER — Ambulatory Visit (HOSPITAL_BASED_OUTPATIENT_CLINIC_OR_DEPARTMENT_OTHER)
Admission: RE | Admit: 2015-08-26 | Discharge: 2015-08-26 | Disposition: A | Discharge status: Home / Self Care | Payer: BLUE CROSS/BLUE SHIELD | Source: Ambulatory Visit | Attending: Medical | Admitting: Medical

## 2015-08-26 ENCOUNTER — Encounter: Payer: Self-pay | Admitting: Medical

## 2015-08-26 ENCOUNTER — Ambulatory Visit (INDEPENDENT_AMBULATORY_CARE_PROVIDER_SITE_OTHER): Payer: BLUE CROSS/BLUE SHIELD | Admitting: Medical

## 2015-08-26 VITALS — BP 124/82 | HR 78 | Temp 97.7°F | Ht 63.0 in | Wt 148.8 lb

## 2015-08-26 DIAGNOSIS — M545 Low back pain, unspecified: Secondary | ICD-10-CM

## 2015-08-26 DIAGNOSIS — R1011 Right upper quadrant pain: Secondary | ICD-10-CM

## 2015-08-26 DIAGNOSIS — K219 Gastro-esophageal reflux disease without esophagitis: Secondary | ICD-10-CM

## 2015-08-26 LAB — COMPREHENSIVE METABOLIC PANEL
ALT: 15 U/L (ref 6–29)
AST: 13 U/L (ref 10–35)
Albumin: 4 g/dL (ref 3.6–5.1)
Alkaline Phosphatase: 71 U/L (ref 33–115)
BILIRUBIN TOTAL: 0.8 mg/dL (ref 0.2–1.2)
BUN: 12 mg/dL (ref 7–25)
CALCIUM: 9 mg/dL (ref 8.6–10.2)
CO2: 28 mmol/L (ref 20–31)
Chloride: 102 mmol/L (ref 98–110)
Creat: 0.83 mg/dL (ref 0.50–1.10)
GLUCOSE: 83 mg/dL (ref 65–99)
Potassium: 3.7 mmol/L (ref 3.5–5.3)
Sodium: 138 mmol/L (ref 135–146)
TOTAL PROTEIN: 7.1 g/dL (ref 6.1–8.1)

## 2015-08-26 LAB — CBC WITH DIFFERENTIAL/PLATELET
BASOS ABS: 0.1 10*3/uL (ref 0.0–0.1)
Basophils Relative: 1 % (ref 0–1)
EOS PCT: 4 % (ref 0–5)
Eosinophils Absolute: 0.3 10*3/uL (ref 0.0–0.7)
HEMATOCRIT: 36.3 % (ref 36.0–46.0)
HEMOGLOBIN: 11.7 g/dL — AB (ref 12.0–15.0)
LYMPHS PCT: 38 % (ref 12–46)
Lymphs Abs: 2.9 10*3/uL (ref 0.7–4.0)
MCH: 28.5 pg (ref 26.0–34.0)
MCHC: 32.2 g/dL (ref 30.0–36.0)
MCV: 88.5 fL (ref 78.0–100.0)
MPV: 9.3 fL (ref 8.6–12.4)
Monocytes Absolute: 0.7 10*3/uL (ref 0.1–1.0)
Monocytes Relative: 9 % (ref 3–12)
NEUTROS ABS: 3.6 10*3/uL (ref 1.7–7.7)
NEUTROS PCT: 48 % (ref 43–77)
Platelets: 374 10*3/uL (ref 150–400)
RBC: 4.1 MIL/uL (ref 3.87–5.11)
RDW: 13.3 % (ref 11.5–15.5)
WBC: 7.5 10*3/uL (ref 4.0–10.5)

## 2015-08-26 LAB — LIPASE: Lipase: 8 U/L (ref 7–60)

## 2015-08-26 LAB — AMYLASE: Amylase: 36 U/L (ref 0–105)

## 2015-08-26 MED ORDER — KETOROLAC TROMETHAMINE 60 MG/2ML IM SOLN
60.0000 mg | Freq: Once | INTRAMUSCULAR | Status: AC
  Administered 2015-08-26: 60 mg via INTRAMUSCULAR

## 2015-08-26 MED ORDER — OMEPRAZOLE 20 MG PO CPDR: 20.0000 mg | DELAYED_RELEASE_CAPSULE | Freq: Every day | ORAL | Status: DC

## 2015-08-26 MED ORDER — GI COCKTAIL ~~LOC~~
30.0000 mL | Freq: Once | ORAL | Status: AC
  Administered 2015-08-26: 30 mL via ORAL

## 2015-08-26 NOTE — Progress Notes (Signed)
Subjective:    Patient ID: Christine BoersAngie Garmon, female    DOB: [birth year], 50 y.o.   MRN: [id number]  HPI  Pt in hurting on her rt side back and some rt upper quadrant pain. Pain has been for one week. Pain is constant now. Regardless of positions. No fever or chills. Some nausea all day. Some reflux recently. Some pain in rt upper quadrant that gets worse after eating. Last time she ate was 8 am. Since then only drinking water. No pain on  Urination. But when uses bathroom some back pain. But denies any pain on urination.  Pt states both back pain and abdomen pain are on severe side. Rt side back pain is reported worse.  She notes that she has back pain in the past and mobic and robaxin was given. She also mentioned as she was laying down for US that I ordered today that her back muscles seemed to spasm.  LMP-hysterectomy.  Also some ear pain and some st.(this has been present since [facility]   Review of Systems  Constitutional: Negative for fever, chills and fatigue.  HENT: Positive for ear pain and sore throat. Negative for congestion.   Respiratory: Negative for cough, chest tightness, shortness of breath and wheezing.   Cardiovascular: Negative for chest pain and palpitations.  Gastrointestinal: Positive for abdominal pain. Negative for nausea, diarrhea, blood in stool and abdominal distention.  Musculoskeletal: Positive for back pain.  Neurological: Negative for dizziness and headaches.  Hematological: Negative for adenopathy. Does not bruise/bleed easily.  Psychiatric/Behavioral: Negative for behavioral problems, confusion and dysphoric mood.    Past Medical History  Diagnosis Date  . GERD (gastroesophageal reflux disease)   . Vitamin D deficiency   . Chicken pox   . Seasonal allergies   . Environmental allergies   . Glaucoma (increased eye pressure)     Borderline    Social History   Social History  . Marital Status: Legally Separated    Spouse Name: N/A    . Number of Children: N/A  . Years of Education: N/A   Occupational History  . Not on file.   Social History Main Topics  . Smoking status: Never Smoker   . Smokeless tobacco: Never Used  . Alcohol Use: Yes     Comment: rare  . Drug Use: No  . Sexual Activity: Not on file   Other Topics Concern  . Not on file   Social History Narrative    Past Surgical History  Procedure Laterality Date  . Cesarean section    . Abdominal hysterectomy    . Wisdom tooth extraction      Family History  Problem Relation Age of Onset  . Hypertension Mother     Living  . Stroke Mother     multiple  . COPD Mother   . COPD Father 9567    Deceased  . Bone cancer Paternal Grandmother   . Diabetes Mother   . Glaucoma Maternal Aunt   . Hypertension Brother     x3  . Heart disease Brother     x1  . Asthma Son   . Allergies Son   . Eczema Son     x1  . Eczema Daughter     x3    Allergies  Allergen Reactions  . Codeine Nausea And Vomiting  . Prednisone Nausea And Vomiting    Current Outpatient Prescriptions on File Prior to Visit  Medication Sig Dispense Refill  . amoxicillin-clavulanate (AUGMENTIN) 875-125  MG tablet Take 1 tablet by mouth 2 (two) times daily. 14 tablet 0  . aspirin EC 81 MG tablet Take 81 mg by mouth daily as needed.     . clobetasol cream (TEMOVATE) 0.05 % Apply 1 application topically 2 (two) times daily. 30 g 0  . co-enzyme Q-10 50 MG capsule Take 100 mg by mouth.    . fish oil-omega-3 fatty acids 1000 MG capsule Take 1 g by mouth every other day.     . fluticasone (FLONASE) 50 MCG/ACT nasal spray Place 2 sprays into both nostrils daily. 16 g 6  . loratadine (CLARITIN) 10 MG tablet Take 10 mg by mouth daily as needed.     . lovastatin (MEVACOR) 20 MG tablet Take 1 tablet (20 mg total) by mouth at bedtime. 30 tablet 3  . magnesium hydroxide (MILK OF MAGNESIA) 800 MG/5ML suspension Take 30 mLs by mouth daily as needed.     . meloxicam (MOBIC) 15 MG tablet Take 1  tablet (15 mg total) by mouth daily. 30 tablet 0  . methocarbamol (ROBAXIN) 500 MG tablet Take 1 tablet (500 mg total) by mouth every evening. 30 tablet 1  . Multiple Vitamin (MULTIVITAMIN) tablet Take 1 tablet by mouth daily.      . ranitidine (ZANTAC) 150 MG tablet Take 1 tablet (150 mg total) by mouth 2 (two) times daily. 60 tablet 3   No current facility-administered medications on file prior to visit.    BP 124/82 mmHg  Pulse 78  Temp(Src) 97.7 F (36.5 C) (Oral)  Ht  (1.6 m)  Wt 148 lb 12.8 oz (67.495 kg)  BMI 26.37 kg/m2  SpO2 98%       Objective:   Physical Exam  General Appearance- Not in acute distress.  HEENT Eyes- Scleraeral/Conjuntiva-bilat- Not Yellow. Mouth & Throat- Normal. Ears- appear normal. TM's not red.  Chest and Lung Exam Auscultation: Breath sounds:-Normal. Adventitious sounds:- No Adventitious sounds.  Cardiovascular Auscultation:Rythm - Regular. Heart Sounds -Normal heart sounds.  Abdomen Inspection:-Inspection Normal.  Palpation/Perucssion: Palpation and Percussion of the abdomen reveal- rt upper quadrant  Tenderness and epigastric, No Rebound tenderness, No rigidity(Guarding) and No Palpable abdominal masses.  Liver:-Normal.  Spleen:- Normal.   Back- no cva tenderness. But some pain above rt cva area toward paraspinal area.       Assessment & Plan:  Will get labs stat in our office now. Cbc, cmp, lipase, amylase now.  Toradol 60 mg im today.  6:30 pm tonight. [place]. Stay in radiology until we discuss results. Results were normal. And labs were ok as well.  Rx omeprazole. Continue ranitidine.  Follow up tonight by phone.   Office follow up on Monday If worens over weekend the ED.  After [place] I asked pt to come back up and we gave her GI cocktail. Her stomach felt a lot better.   She then described muscle spasm type pain lying down to get [place]. I advsied to start taking robaxin again and see if this resolves symtoms in  back.  Pt was against doing any ct abd today. I advised if pain returns would need to consider. Also if pain returns I will ask her to get h pylori breath test and refer to Gi. She may need endoscopy.

## 2015-08-26 NOTE — Progress Notes (Signed)
Pre visit review using our clinic review tool, if applicable. No additional management support is needed unless otherwise documented below in the visit note. 

## 2015-08-26 NOTE — Patient Instructions (Addendum)
Will get labs stat in our office now. Cbc, cmp, lipase, amylase now.  Toradol 60 mg im today.  6:30 pm tonight. US. Stay in radiology until we discuss results.  Rx omeprazole. Continue ranitidine.  Follow up tonight by phone.   Office follow up on Monday If worens over weekend the ED.  See Assessment and plan section as well.

## 2015-09-01 ENCOUNTER — Telehealth: Payer: Self-pay | Admitting: Medical

## 2015-09-01 NOTE — Telephone Encounter (Signed)
Reviewed labs today again got hard copy 6 days after pt seen. How is she doing?

## 2015-09-03 ENCOUNTER — Encounter: Payer: Self-pay | Admitting: Physician Assistant

## 2015-09-03 ENCOUNTER — Ambulatory Visit (INDEPENDENT_AMBULATORY_CARE_PROVIDER_SITE_OTHER): Payer: BLUE CROSS/BLUE SHIELD | Admitting: Physician Assistant

## 2015-09-03 VITALS — BP 130/80 | HR 88 | Temp 98.2°F | Ht 63.0 in | Wt 147.2 lb

## 2015-09-03 DIAGNOSIS — M62830 Muscle spasm of back: Secondary | ICD-10-CM

## 2015-09-03 DIAGNOSIS — M546 Pain in thoracic spine: Secondary | ICD-10-CM | POA: Diagnosis not present

## 2015-09-03 LAB — POC URINALSYSI DIPSTICK (AUTOMATED)
Bilirubin, UA: NEGATIVE
GLUCOSE UA: NEGATIVE
Ketones, UA: NEGATIVE
LEUKOCYTES UA: NEGATIVE
NITRITE UA: NEGATIVE
PH UA: 6
Protein, UA: NEGATIVE
RBC UA: NEGATIVE
Urobilinogen, UA: 0.2

## 2015-09-03 MED ORDER — CELECOXIB 100 MG PO CAPS: 100.0000 mg | ORAL_CAPSULE | Freq: Two times a day (BID) | ORAL | Status: DC

## 2015-09-03 MED ORDER — BACLOFEN 10 MG PO TABS: 10.0000 mg | ORAL_TABLET | Freq: Three times a day (TID) | ORAL | Status: DC

## 2015-09-03 NOTE — Progress Notes (Signed)
Patient presents to clinic today for follow-up of visit last week. Patient was seen by another provider for R-sided pain. Labs unremarkable. US abdomen obtained at last visit was negative. Patient started on a new GERD regimen and instructed to follow-up with PCP. Patient endorses R side pain persists. Denies abdominal pain, nausea or vomiting. Denies urinary urgency, frequency, hematuria, fever or chills. Denies constipation, diarrhea, melena or tenesmus. Endorses pain is with ROM and radiates into her lower back. Denies trauma or injury.    Past Medical History  Diagnosis Date  . GERD (gastroesophageal reflux disease)   . Vitamin D deficiency   . Chicken pox   . Seasonal allergies   . Environmental allergies   . Glaucoma (increased eye pressure)     Borderline    Current Outpatient Prescriptions on File Prior to Visit  Medication Sig Dispense Refill  . aspirin EC 81 MG tablet Take 81 mg by mouth daily as needed.     . clobetasol cream (TEMOVATE) 1.83 % Apply 1 application topically 2 (two) times daily. 30 g 0  . co-enzyme Q-10 50 MG capsule Take 100 mg by mouth.    . fish oil-omega-3 fatty acids 1000 MG capsule Take 1 g by mouth every other day.     . fluticasone (FLONASE) 50 MCG/ACT nasal spray Place 2 sprays into both nostrils daily. 16 g 6  . loratadine (CLARITIN) 10 MG tablet Take 10 mg by mouth daily as needed.     . lovastatin (MEVACOR) 20 MG tablet Take 1 tablet (20 mg total) by mouth at bedtime. 30 tablet 3  . magnesium hydroxide (MILK OF MAGNESIA) 800 MG/5ML suspension Take 30 mLs by mouth daily as needed.     . Multiple Vitamin (MULTIVITAMIN) tablet Take 1 tablet by mouth daily.      Marland Kitchen omeprazole (PRILOSEC) 20 MG capsule Take 1 capsule (20 mg total) by mouth daily. 30 capsule 3  . ranitidine (ZANTAC) 150 MG tablet Take 1 tablet (150 mg total) by mouth 2 (two) times daily. 60 tablet 3   No current facility-administered medications on file prior to visit.    Allergies    Allergen Reactions  . Codeine Nausea And Vomiting  . Prednisone Nausea And Vomiting    Family History  Problem Relation Age of Onset  . Hypertension Mother     Living  . Stroke Mother     multiple  . COPD Mother   . COPD Father 6    Deceased  . Bone cancer Paternal Grandmother   . Diabetes Mother   . Glaucoma Maternal Aunt   . Hypertension Brother     x3  . Heart disease Brother     x1  . Asthma Son   . Allergies Son   . Eczema Son     x1  . Eczema Daughter     x3    Social History   Social History  . Marital Status: Legally Separated    Spouse Name: N/A  . Number of Children: N/A  . Years of Education: N/A   Social History Main Topics  . Smoking status: Never Smoker   . Smokeless tobacco: Never Used  . Alcohol Use: Yes     Comment: rare  . Drug Use: No  . Sexual Activity: Not Asked   Other Topics Concern  . None   Social History Narrative    Review of Systems - See HPI.  All other ROS are negative.  BP 130/80 mmHg  Pulse 88  Temp(Src) 98.2 F (36.8 C) (Oral)  Ht 5' 3"  (1.6 m)  Wt 147 lb 3.2 oz (66.769 kg)  BMI 26.08 kg/m2  SpO2 99%  Physical Exam  Constitutional: She is well-developed, well-nourished, and in no distress.  HENT:  Head: Normocephalic and atraumatic.  Eyes: Conjunctivae are normal.  Cardiovascular: Normal rate, regular rhythm, normal heart sounds and intact distal pulses.   Pulmonary/Chest: Effort normal and breath sounds normal. No respiratory distress. She has no wheezes. She has no rales. She exhibits no tenderness.  Abdominal: Soft. Bowel sounds are normal. She exhibits no distension and no mass. There is no hepatosplenomegaly. There is no tenderness. There is no rebound, no guarding, no CVA tenderness and negative Murphy's sign.  Musculoskeletal:       Back:  Skin: Skin is warm and dry. No rash noted.  Psychiatric: Affect normal.  Vitals reviewed.   Recent Results (from the past 2160 hour(s))  CBC w/Diff     Status:  Abnormal   Collection Time: 08/26/15  5:10 PM  Result Value Ref Range   WBC 7.5 4.0 - 10.5 K/uL   RBC 4.10 3.87 - 5.11 MIL/uL   Hemoglobin 11.7 (L) 12.0 - 15.0 g/dL   HCT 36.3 36.0 - 46.0 %   MCV 88.5 78.0 - 100.0 fL   MCH 28.5 26.0 - 34.0 pg   MCHC 32.2 30.0 - 36.0 g/dL   RDW 13.3 11.5 - 15.5 %   Platelets 374 150 - 400 K/uL   MPV 9.3 8.6 - 12.4 fL   Neutrophils Relative % 48 43 - 77 %   Neutro Abs 3.6 1.7 - 7.7 K/uL   Lymphocytes Relative 38 12 - 46 %   Lymphs Abs 2.9 0.7 - 4.0 K/uL   Monocytes Relative 9 3 - 12 %   Monocytes Absolute 0.7 0.1 - 1.0 K/uL   Eosinophils Relative 4 0 - 5 %   Eosinophils Absolute 0.3 0.0 - 0.7 K/uL   Basophils Relative 1 0 - 1 %   Basophils Absolute 0.1 0.0 - 0.1 K/uL   Smear Review Criteria for review not met   Comp Met (CMET)     Status: None   Collection Time: 08/26/15  5:10 PM  Result Value Ref Range   Sodium 138 135 - 146 mmol/L   Potassium 3.7 3.5 - 5.3 mmol/L   Chloride 102 98 - 110 mmol/L   CO2 28 20 - 31 mmol/L   Glucose, Bld 83 65 - 99 mg/dL   BUN 12 7 - 25 mg/dL   Creat 0.83 0.50 - 1.10 mg/dL   Total Bilirubin 0.8 0.2 - 1.2 mg/dL   Alkaline Phosphatase 71 33 - 115 U/L   AST 13 10 - 35 U/L   ALT 15 6 - 29 U/L   Total Protein 7.1 6.1 - 8.1 g/dL   Albumin 4.0 3.6 - 5.1 g/dL   Calcium 9.0 8.6 - 10.2 mg/dL  Amylase     Status: None   Collection Time: 08/26/15  5:10 PM  Result Value Ref Range   Amylase 36 0 - 105 U/L  Lipase     Status: None   Collection Time: 08/26/15  5:10 PM  Result Value Ref Range   Lipase 8 7 - 60 U/L  POCT Urinalysis Dipstick (Automated)     Status: None   Collection Time: 09/03/15  3:42 PM  Result Value Ref Range   Color, UA yellow    Clarity, UA clear  Glucose, UA neg    Bilirubin, UA neg    Ketones, UA neg    Spec Grav, UA >=1.030    Blood, UA neg    pH, UA 6.0    Protein, UA neg    Urobilinogen, UA 0.2    Nitrite, UA neg    Leukocytes, UA Negative Negative     Assessment/Plan: Right-sided thoracic back pain Urine dip unremarkable. Previous labs and imaging reviewed. All within normal limits. Palpable tenderness and significant spasm. Stop Robaxin. Rx Baclofen and Celebrex. Avoid heavy lifting. Work note written. Heat and Aspercreme reviewed. Supportive measures reviewed. Follow-up if not resolving.

## 2015-09-03 NOTE — Patient Instructions (Signed)
Please take the Celebrex twice daily as directed with food. Apply Topical Aspercreme to the right-side. Heating pad may help but only leave on for 10 minutes a few times per day. Stop the Robaxin and take Baclofen as directed. Limit heavy lifting. Follow-up if symptoms are not improving.

## 2015-09-03 NOTE — Progress Notes (Signed)
Pre visit review using our clinic review tool, if applicable. No additional management support is needed unless otherwise documented below in the visit note. 

## 2015-09-05 DIAGNOSIS — M546 Pain in thoracic spine: Secondary | ICD-10-CM | POA: Insufficient documentation

## 2015-09-05 NOTE — Assessment & Plan Note (Signed)
Urine dip unremarkable. Previous labs and imaging reviewed. All within normal limits. Palpable tenderness and significant spasm. Stop Robaxin. Rx Baclofen and Celebrex. Avoid heavy lifting. Work note written. Heat and Aspercreme reviewed. Supportive measures reviewed. Follow-up if not resolving.

## 2015-09-30 ENCOUNTER — Telehealth: Payer: Self-pay | Admitting: Physician Assistant

## 2015-09-30 NOTE — Telephone Encounter (Signed)
Pt called in because she says that she  Is still having muscle spasms. Pt would like to know if PCP could look over her labs to be sure that there is nothing wrong.   ALSO, pt would like to know if PCP or CMA could make records (lab) available, she would like to pick them up.

## 2015-09-30 NOTE — Telephone Encounter (Signed)
Called to follow up with patient.  No answer.  Mailbox full, unable to leave a message.  Will try again later.

## 2015-10-01 ENCOUNTER — Other Ambulatory Visit: Payer: Self-pay | Admitting: Physician Assistant

## 2015-10-01 ENCOUNTER — Other Ambulatory Visit (INDEPENDENT_AMBULATORY_CARE_PROVIDER_SITE_OTHER): Payer: BLUE CROSS/BLUE SHIELD

## 2015-10-01 DIAGNOSIS — R252 Cramp and spasm: Secondary | ICD-10-CM

## 2015-10-01 LAB — BASIC METABOLIC PANEL
BUN: 14 mg/dL (ref 6–23)
CALCIUM: 9.3 mg/dL (ref 8.4–10.5)
CO2: 28 mEq/L (ref 19–32)
Chloride: 104 mEq/L (ref 96–112)
Creatinine, Ser: 0.76 mg/dL (ref 0.40–1.20)
GFR: 104.1 mL/min (ref >60.00)
GLUCOSE: 92 mg/dL (ref 70–99)
Potassium: 3.7 mEq/L (ref 3.5–5.1)
Sodium: 137 mEq/L (ref 135–145)

## 2015-10-01 LAB — MAGNESIUM: Magnesium: 2.2 mg/dL (ref 1.5–2.5)

## 2015-10-04 NOTE — Telephone Encounter (Signed)
Results given to patient by CMA Scates this morning. She is to start MTV with magnesium.

## 2015-12-16 DIAGNOSIS — H524 Presbyopia: Secondary | ICD-10-CM | POA: Diagnosis not present

## 2015-12-16 DIAGNOSIS — H5213 Myopia, bilateral: Secondary | ICD-10-CM | POA: Diagnosis not present

## 2015-12-16 DIAGNOSIS — H52223 Regular astigmatism, bilateral: Secondary | ICD-10-CM | POA: Diagnosis not present

## 2015-12-22 ENCOUNTER — Ambulatory Visit (INDEPENDENT_AMBULATORY_CARE_PROVIDER_SITE_OTHER): Payer: BLUE CROSS/BLUE SHIELD | Admitting: Physician Assistant

## 2015-12-22 ENCOUNTER — Encounter: Payer: Self-pay | Admitting: Physician Assistant

## 2015-12-22 VITALS — BP 108/86 | HR 90 | Temp 98.5°F | Resp 16 | Ht 63.0 in | Wt 151.4 lb

## 2015-12-22 DIAGNOSIS — G8929 Other chronic pain: Secondary | ICD-10-CM | POA: Diagnosis not present

## 2015-12-22 DIAGNOSIS — R1010 Upper abdominal pain, unspecified: Secondary | ICD-10-CM | POA: Diagnosis not present

## 2015-12-22 DIAGNOSIS — R1011 Right upper quadrant pain: Principal | ICD-10-CM

## 2015-12-22 LAB — POCT URINALYSIS DIPSTICK
Bilirubin, UA: NEGATIVE
GLUCOSE UA: NEGATIVE
Ketones, UA: NEGATIVE
Leukocytes, UA: NEGATIVE
NITRITE UA: NEGATIVE
PROTEIN UA: NEGATIVE
RBC UA: NEGATIVE
UROBILINOGEN UA: 0.2
pH, UA: 6

## 2015-12-22 MED ORDER — DICYCLOMINE HCL 10 MG PO CAPS: 10.0000 mg | ORAL_CAPSULE | Freq: Three times a day (TID) | ORAL | Status: DC

## 2015-12-22 NOTE — Progress Notes (Signed)
Pre visit review using our clinic review tool, if applicable. No additional management support is needed unless otherwise documented below in the visit note/SLS  

## 2015-12-22 NOTE — Patient Instructions (Signed)
Please start a daily probiotic. Take the Bentyl as directed for abdominal spasm.  Follow the directions below for constipation.  I encourage you to increase hydration and the amount of fiber in your diet.  Start a daily probiotic (Align, Culturelle, Digestive Advantage, etc.). If no bowel movement within 24 hours, take 2 Tbs of Milk of Magnesia in a 4 oz glass of warmed prune juice every 2-3 days to help promote bowel movement. If no results within 24 hours, then repeat above regimen, adding a Dulcolax stool softener to regimen. If this does not promote a bowel movement, please call the office.  You will be contacted by Gastroenterology for an appointment. I would encourage you to reconsider letting me do further imaging.  If anything acutely worsens, please go to the ER.

## 2015-12-23 NOTE — Progress Notes (Signed)
Patient presents to clinic today c/o continued chronic RUQ pain that is constant but worsened with increased stress at work. Patient has been seen previously for this, having lab workup and US abdomen that was unremarkable. Patient notes pain improves with BM. Endorses her stools are hard and occur once every couple of days. Denies rectal bleeding or tenesmus. Denies fever, chills, nausea or vomiting. Denies change in diet. Denies history of IBS or IBD.   Past Medical History  Diagnosis Date  . GERD (gastroesophageal reflux disease)   . Vitamin D deficiency   . Chicken pox   . Seasonal allergies   . Environmental allergies   . Glaucoma (increased eye pressure)     Borderline    Current Outpatient Prescriptions on File Prior to Visit  Medication Sig Dispense Refill  . aspirin EC 81 MG tablet Take 81 mg by mouth daily as needed.     . baclofen (LIORESAL) 10 MG tablet Take 1 tablet (10 mg total) by mouth 3 (three) times daily. 30 each 0  . celecoxib (CELEBREX) 100 MG capsule Take 1 capsule (100 mg total) by mouth 2 (two) times daily. 30 capsule 0  . clobetasol cream (TEMOVATE) 0.05 % Apply 1 application topically 2 (two) times daily. 30 g 0  . co-enzyme Q-10 50 MG capsule Take 100 mg by mouth.    . fish oil-omega-3 fatty acids 1000 MG capsule Take 1 g by mouth every other day.     . fluticasone (FLONASE) 50 MCG/ACT nasal spray Place 2 sprays into both nostrils daily. 16 g 6  . loratadine (CLARITIN) 10 MG tablet Take 10 mg by mouth daily as needed.     . lovastatin (MEVACOR) 20 MG tablet Take 1 tablet (20 mg total) by mouth at bedtime. 30 tablet 3  . Multiple Vitamin (MULTIVITAMIN) tablet Take 1 tablet by mouth daily.      Marland Kitchen omeprazole (PRILOSEC) 20 MG capsule Take 1 capsule (20 mg total) by mouth daily. 30 capsule 3  . ranitidine (ZANTAC) 150 MG tablet Take 1 tablet (150 mg total) by mouth 2 (two) times daily. 60 tablet 3   No current facility-administered medications on file prior to  visit.    Allergies  Allergen Reactions  . Codeine Nausea And Vomiting  . Prednisone Nausea And Vomiting    Family History  Problem Relation Age of Onset  . Hypertension Mother     Living  . Stroke Mother     multiple  . COPD Mother   . COPD Father 71    Deceased  . Bone cancer Paternal Grandmother   . Diabetes Mother   . Glaucoma Maternal Aunt   . Hypertension Brother     x3  . Heart disease Brother     x1  . Asthma Son   . Allergies Son   . Eczema Son     x1  . Eczema Daughter     x3    Social History   Social History  . Marital Status: Legally Separated    Spouse Name: N/A  . Number of Children: N/A  . Years of Education: N/A   Social History Main Topics  . Smoking status: Never Smoker   . Smokeless tobacco: Never Used  . Alcohol Use: Yes     Comment: rare  . Drug Use: No  . Sexual Activity: Not Asked   Other Topics Concern  . None   Social History Narrative    Review of Systems -  See HPI.  All other ROS are negative.  BP 108/86 mmHg  Pulse 90  Temp(Src) 98.5 F (36.9 C) (Oral)  Resp 16  Ht 5\' 3"  (1.6 m)  Wt 151 lb 6 oz (68.663 kg)  BMI 26.82 kg/m2  SpO2 98%  Physical Exam  Constitutional: She is oriented to person, place, and time and well-developed, well-nourished, and in no distress.  HENT:  Head: Normocephalic and atraumatic.  Eyes: Conjunctivae are normal.  Cardiovascular: Normal rate, regular rhythm, normal heart sounds and intact distal pulses.   Pulmonary/Chest: Effort normal and breath sounds normal. No respiratory distress. She has no wheezes. She has no rales. She exhibits no tenderness.  Abdominal: Bowel sounds are normal. There is no hepatosplenomegaly. There is tenderness in the right upper quadrant. There is negative Murphy's sign.  Neurological: She is alert and oriented to person, place, and time.  Skin: Skin is warm and dry. No rash noted.  Psychiatric: Affect normal.  Vitals reviewed.   Recent Results (from the  past 2160 hour(s))  Magnesium     Status: None   Collection Time: 10/01/15 11:07 AM  Result Value Ref Range   Magnesium 2.2 1.5 - 2.5 mg/dL  Basic Metabolic Panel (BMET)     Status: None   Collection Time: 10/01/15 11:07 AM  Result Value Ref Range   Sodium 137 135 - 145 mEq/L   Potassium 3.7 3.5 - 5.1 mEq/L   Chloride 104 96 - 112 mEq/L   CO2 28 19 - 32 mEq/L   Glucose, Bld 92 70 - 99 mg/dL   BUN 14 6 - 23 mg/dL   Creatinine, Ser 9.600.76 0.40 - 1.20 mg/dL   Calcium 9.3 8.4 - 45.410.5 mg/dL   GFR 098.11104.10 >91.47>60.00 mL/min  POCT urinalysis dipstick     Status: None   Collection Time: 12/22/15  6:44 PM  Result Value Ref Range   Color, UA yellow    Clarity, UA clear    Glucose, UA neg    Bilirubin, UA neg    Ketones, UA neg    Spec Grav, UA >=1.030    Blood, UA neg    pH, UA 6.0    Protein, UA neg    Urobilinogen, UA 0.2    Nitrite, UA neg    Leukocytes, UA Negative Negative    Assessment/Plan: 1. Chronic RUQ pain Discussed potential etiologies to include sphincter of oddi dysfunction or symptomatic gallbladder disease since US was negative for stone. Suspect at least some component of IBS here. Recommended repeat labs but patient declines at present. Recommend CT but patient declines. Only wants to see a specialist for further assessment to save money. Urine dip obtained and unremarkable. Rx bentyl for abdominal cramping. She is to start a probiotic. Bowel regimen given for constipation. Referral to GI placed. Discussed that if she cannot be seen in a timely fashion, she needs to reconsider letting us look into this further. ER precautions reviewed. Patient voices understanding.  - POCT urinalysis dipstick; Standing - POCT urinalysis dipstick   Piedad ClimesMartin, Christine Stepney Cody, PA-C

## 2015-12-24 ENCOUNTER — Telehealth: Payer: Self-pay | Admitting: Physician Assistant

## 2015-12-24 DIAGNOSIS — R1011 Right upper quadrant pain: Secondary | ICD-10-CM | POA: Diagnosis not present

## 2015-12-24 DIAGNOSIS — R140 Abdominal distension (gaseous): Secondary | ICD-10-CM | POA: Diagnosis not present

## 2015-12-24 DIAGNOSIS — R194 Change in bowel habit: Secondary | ICD-10-CM | POA: Diagnosis not present

## 2015-12-24 DIAGNOSIS — K219 Gastro-esophageal reflux disease without esophagitis: Secondary | ICD-10-CM | POA: Diagnosis not present

## 2015-12-24 NOTE — Telephone Encounter (Signed)
Referral faxed, pt aware.

## 2015-12-24 NOTE — Telephone Encounter (Signed)
°  Relationship to patient: Self  Can be reached: [phone]   Reason for call: Request referral to [facility]. States she has appointment scheduled for today. Had referral done yesterday but could not get appointment at that office until Sept.

## 2016-01-22 DIAGNOSIS — W57XXXA Bitten or stung by nonvenomous insect and other nonvenomous arthropods, initial encounter: Secondary | ICD-10-CM | POA: Diagnosis not present

## 2016-01-22 DIAGNOSIS — L03115 Cellulitis of right lower limb: Secondary | ICD-10-CM | POA: Diagnosis not present

## 2016-01-22 DIAGNOSIS — S70361A Insect bite (nonvenomous), right thigh, initial encounter: Secondary | ICD-10-CM | POA: Diagnosis not present

## 2016-01-27 DIAGNOSIS — Z6827 Body mass index (BMI) 27.0-27.9, adult: Secondary | ICD-10-CM | POA: Diagnosis not present

## 2016-01-27 DIAGNOSIS — Z01419 Encounter for gynecological examination (general) (routine) without abnormal findings: Secondary | ICD-10-CM | POA: Diagnosis not present

## 2016-02-14 DIAGNOSIS — K5900 Constipation, unspecified: Secondary | ICD-10-CM | POA: Diagnosis not present

## 2016-02-14 DIAGNOSIS — K649 Unspecified hemorrhoids: Secondary | ICD-10-CM | POA: Diagnosis not present

## 2016-02-14 DIAGNOSIS — R1011 Right upper quadrant pain: Secondary | ICD-10-CM | POA: Diagnosis not present

## 2016-02-14 DIAGNOSIS — R140 Abdominal distension (gaseous): Secondary | ICD-10-CM | POA: Diagnosis not present

## 2016-02-14 DIAGNOSIS — K317 Polyp of stomach and duodenum: Secondary | ICD-10-CM | POA: Diagnosis not present

## 2016-02-14 DIAGNOSIS — K219 Gastro-esophageal reflux disease without esophagitis: Secondary | ICD-10-CM | POA: Diagnosis not present

## 2016-02-14 DIAGNOSIS — Z0000 Encounter for general adult medical examination without abnormal findings: Secondary | ICD-10-CM | POA: Diagnosis not present

## 2016-03-06 ENCOUNTER — Ambulatory Visit (INDEPENDENT_AMBULATORY_CARE_PROVIDER_SITE_OTHER): Payer: BLUE CROSS/BLUE SHIELD | Admitting: Physician Assistant

## 2016-03-06 VITALS — BP 129/86 | HR 84 | Temp 97.5°F | Resp 16 | Ht 63.0 in | Wt 152.0 lb

## 2016-03-06 DIAGNOSIS — G5711 Meralgia paresthetica, right lower limb: Secondary | ICD-10-CM

## 2016-03-06 NOTE — Progress Notes (Signed)
Pre visit review using our clinic review tool, if applicable. No additional management support is needed unless otherwise documented below in the visit note/SLS Patient declined Influenza vaccine, she will be receiving through her Employer.

## 2016-03-06 NOTE — Patient Instructions (Signed)
Please take the Celebrex as directed with food over the next few days.  Wear loose fitting clothing. Avoid sitting for prolonged periods or leaning on the affected side. Symptoms should calm down on their own.  As discussed at visit, I do not see any sign of shingles. If you notice any bumps or blisters developing, please call me immediately.

## 2016-03-06 NOTE — Progress Notes (Signed)
Patient presents to clinic today c/o pain in R outer thigh x 3 days. Pain is located in the anterior thigh. Describes pain as aching and burning with some tenderness to touch. End2orses some intermittent numbness of lateral thigh. Denies trama or injury. Denies noting rash or lesion. Is alleviated if she takes pressure off of the affected side. Denies fever, chills, malaise or fatigue.   Past Medical History:  Diagnosis Date  . Chicken pox   . Environmental allergies   . GERD (gastroesophageal reflux disease)   . Glaucoma (increased eye pressure)    Borderline  . Seasonal allergies   . Vitamin D deficiency     Current Outpatient Prescriptions on File Prior to Visit  Medication Sig Dispense Refill  . aspirin EC 81 MG tablet Take 81 mg by mouth daily as needed.     . baclofen (LIORESAL) 10 MG tablet Take 1 tablet (10 mg total) by mouth 3 (three) times daily. (Patient taking differently: Take 10 mg by mouth 3 (three) times daily as needed. ) 30 each 0  . celecoxib (CELEBREX) 100 MG capsule Take 1 capsule (100 mg total) by mouth 2 (two) times daily. (Patient taking differently: Take 100 mg by mouth 2 (two) times daily as needed. ) 30 capsule 0  . clobetasol cream (TEMOVATE) 0.05 % Apply 1 application topically 2 (two) times daily. 30 g 0  . co-enzyme Q-10 50 MG capsule Take 100 mg by mouth.    . dicyclomine (BENTYL) 10 MG capsule Take 1 capsule (10 mg total) by mouth 3 (three) times daily before meals. (Patient taking differently: Take 10 mg by mouth 3 (three) times daily as needed. ) 90 capsule 1  . fish oil-omega-3 fatty acids 1000 MG capsule Take 1 g by mouth every other day.     . fluticasone (FLONASE) 50 MCG/ACT nasal spray Place 2 sprays into both nostrils daily. 16 g 6  . loratadine (CLARITIN) 10 MG tablet Take 10 mg by mouth daily as needed.     . lovastatin (MEVACOR) 20 MG tablet Take 1 tablet (20 mg total) by mouth at bedtime. 30 tablet 3  . Multiple Vitamin (MULTIVITAMIN) tablet  Take 1 tablet by mouth daily.      Marland Kitchen. omeprazole (PRILOSEC) 20 MG capsule Take 1 capsule (20 mg total) by mouth daily. 30 capsule 3  . ranitidine (ZANTAC) 150 MG tablet Take 1 tablet (150 mg total) by mouth 2 (two) times daily. 60 tablet 3   No current facility-administered medications on file prior to visit.     Allergies  Allergen Reactions  . Codeine Nausea And Vomiting  . Prednisone Nausea And Vomiting    Family History  Problem Relation Age of Onset  . Hypertension Mother     Living  . Stroke Mother     multiple  . COPD Mother   . COPD Father [id number]    Deceased  . Bone cancer Paternal Grandmother   . Diabetes Mother   . Glaucoma Maternal Aunt   . Hypertension Brother     x3  . Heart disease Brother     x1  . Asthma Son   . Allergies Son   . Eczema Son     x1  . Eczema Daughter     x3    Social History   Social History  . Marital status: Legally Separated    Spouse name: N/A  . Number of children: N/A  . Years of education: N/A  Social History Main Topics  . Smoking status: Never Smoker  . Smokeless tobacco: Never Used  . Alcohol use Yes     Comment: rare  . Drug use: No  . Sexual activity: Not on file   Other Topics Concern  . Not on file   Social History Narrative  . No narrative on file   Review of Systems - See HPI.  All other ROS are negative.  BP 129/86 (BP Location: Left Arm, Patient Position: Sitting, Cuff Size: Large)   Pulse 84   Temp 97.5 F (36.4 C) (Oral)   Resp 16   Ht 5\' 3"  (1.6 m)   Wt 152 lb (68.9 kg)   SpO2 100%   BMI 26.93 kg/m   Physical Exam  Constitutional: She is oriented to person, place, and time and well-developed, well-nourished, and in no distress.  HENT:  Head: Normocephalic and atraumatic.  Neck: Neck supple.  Cardiovascular: Normal rate, regular rhythm, normal heart sounds and intact distal pulses.   Pulmonary/Chest: Effort normal.  Musculoskeletal:       Right hip: She exhibits normal range of motion and  normal strength.       Legs: Neurological: She is alert and oriented to person, place, and time.  Skin: Skin is warm and dry. No rash noted.  Psychiatric: Affect normal.  Vitals reviewed.   Recent Results (from the past 2160 hour(s))  POCT urinalysis dipstick     Status: None   Collection Time: 12/22/15  6:44 PM  Result Value Ref Range   Color, UA yellow    Clarity, UA clear    Glucose, UA neg    Bilirubin, UA neg    Ketones, UA neg    Spec Grav, UA >=1.030    Blood, UA neg    pH, UA 6.0    Protein, UA neg    Urobilinogen, UA 0.2    Nitrite, UA neg    Leukocytes, UA Negative Negative    Assessment/Plan: 1. Meralgia paresthetica of right side No evidence of shingles rash and do not feel this is the start of shingles due to symptoms in multiple dermatomes. However she is to keep an eye out for any rash and call at first sign if noticed. Symptoms and exam seem most consistent with a meralgia paresthetica. Supportive measures reviewed. Loose clothing. Celebrex as directed. FU if not resolving.    Piedad Climes, PA-C

## 2016-03-20 ENCOUNTER — Encounter: Payer: Self-pay | Admitting: Family Medicine

## 2016-03-20 ENCOUNTER — Ambulatory Visit (HOSPITAL_BASED_OUTPATIENT_CLINIC_OR_DEPARTMENT_OTHER): Admission: RE | Admit: 2016-03-20 | Payer: BLUE CROSS/BLUE SHIELD | Source: Ambulatory Visit

## 2016-03-20 ENCOUNTER — Ambulatory Visit (INDEPENDENT_AMBULATORY_CARE_PROVIDER_SITE_OTHER): Payer: BLUE CROSS/BLUE SHIELD | Admitting: Family Medicine

## 2016-03-20 VITALS — BP 128/78 | HR 96 | Temp 97.5°F | Ht 62.0 in | Wt 152.2 lb

## 2016-03-20 DIAGNOSIS — M62830 Muscle spasm of back: Secondary | ICD-10-CM | POA: Diagnosis not present

## 2016-03-20 DIAGNOSIS — S99911A Unspecified injury of right ankle, initial encounter: Secondary | ICD-10-CM | POA: Diagnosis not present

## 2016-03-20 MED ORDER — CELECOXIB 100 MG PO CAPS: 100.0000 mg | ORAL_CAPSULE | Freq: Two times a day (BID) | ORAL | 0 refills | Status: DC | PRN

## 2016-03-20 NOTE — Patient Instructions (Addendum)

## 2016-03-20 NOTE — Progress Notes (Signed)
Musculoskeletal Exam  Patient: Christine Griffith DOB: [birth year]  DOS: 03/20/2016  SUBJECTIVE:  Chief Complaint:   Chief Complaint  Patient presents with  . Ankle Pain    Pt reports falling this weekend while raking leaves out her yard/Pt reports trying ice and heat with heat only giving relief  and using red alcohol with ace bandage applied and ibuprofen/ Pt reports having some relief but has been unable to walk/ Pt reports having back spasms and history of back pain     Christine Griffith is a 50 y.o.  female for evaluation and treatment of ankle pain.   Onset:  2 days ago. Fell while raking leaves.  Location: Lateral foot and lateral ankle on R Character:  throbbing  Progression of issue:  has moderately improved Associated symptoms: Difficulty with ambulation Treatment: to date has been ice, OTC NSAIDS and heat.   Neurovascular symptoms: no  ROS: Musculoskeletal/Extremities: +R ankle pain Neurologic: no numbness, tingling no weakness   Past Medical History:  Diagnosis Date  . Chicken pox   . Environmental allergies   . GERD (gastroesophageal reflux disease)   . Glaucoma (increased eye pressure)    Borderline  . Seasonal allergies   . Vitamin D deficiency    Past Surgical History:  Procedure Laterality Date  . ABDOMINAL HYSTERECTOMY    . CESAREAN SECTION    . WISDOM TOOTH EXTRACTION     Family History  Problem Relation Age of Onset  . Hypertension Mother     Living  . Stroke Mother     multiple  . COPD Mother   . COPD Father [id number]    Deceased  . Bone cancer Paternal Grandmother   . Diabetes Mother   . Glaucoma Maternal Aunt   . Hypertension Brother     x3  . Heart disease Brother     x1  . Asthma Son   . Allergies Son   . Eczema Son     x1  . Eczema Daughter     x3   Current Outpatient Prescriptions  Medication Sig Dispense Refill  . aspirin EC 81 MG tablet Take 81 mg by mouth daily as needed.     . baclofen (LIORESAL) 10 MG tablet Take 1 tablet (10 mg  total) by mouth 3 (three) times daily. (Patient taking differently: Take 10 mg by mouth 3 (three) times daily as needed. ) 30 each 0  . celecoxib (CELEBREX) 100 MG capsule Take 1 capsule (100 mg total) by mouth 2 (two) times daily. (Patient taking differently: Take 100 mg by mouth 2 (two) times daily as needed. ) 30 capsule 0  . clobetasol cream (TEMOVATE) 0.05 % Apply 1 application topically 2 (two) times daily. 30 g 0  . co-enzyme Q-10 50 MG capsule Take 100 mg by mouth.    . dicyclomine (BENTYL) 10 MG capsule Take 1 capsule (10 mg total) by mouth 3 (three) times daily before meals. (Patient taking differently: Take 10 mg by mouth 3 (three) times daily as needed. ) 90 capsule 1  . fish oil-omega-3 fatty acids 1000 MG capsule Take 1 g by mouth every other day.     . fluticasone (FLONASE) 50 MCG/ACT nasal spray Place 2 sprays into both nostrils daily. 16 g 6  . loratadine (CLARITIN) 10 MG tablet Take 10 mg by mouth daily as needed.     . lovastatin (MEVACOR) 20 MG tablet Take 1 tablet (20 mg total) by mouth at bedtime. 30 tablet 3  . Multiple  Vitamin (MULTIVITAMIN) tablet Take 1 tablet by mouth daily.      Marland Kitchen omeprazole (PRILOSEC) 20 MG capsule Take 1 capsule (20 mg total) by mouth daily. 30 capsule 3  . ranitidine (ZANTAC) 150 MG tablet Take 1 tablet (150 mg total) by mouth 2 (two) times daily. 60 tablet 3   Allergies  Allergen Reactions  . Codeine Nausea And Vomiting  . Prednisone Nausea And Vomiting  . Hydrocodone Nausea And Vomiting   Social History   Social History  . Marital status: Legally Separated   Social History Main Topics  . Smoking status: Never Smoker  . Smokeless tobacco: Never Used  . Alcohol use Yes     Comment: rare  . Drug use: No   Objective: VITAL SIGNS: BP 128/78 (BP Location: Right Arm, Patient Position: Sitting, Cuff Size: Small)   Pulse 96   Temp 97.5 F (36.4 C) (Oral)   Ht 5\' 2"  (1.575 m)   Wt 152 lb 3.2 oz (69 kg)   SpO2 98%   BMI 27.84 kg/m   Constitutional: Well formed, well developed. No acute distress. Cardiovascular: Brisk cap refill Thorax & Lungs: No accessory muscle use Extremities: No clubbing. No cyanosis. No edema.  Skin: Warm. Dry. No erythema. No rash.  Musculoskeletal: R ankle.   Normal active range of motion: no.   Normal passive range of motion: no Tenderness to palpation: yes- over 5th base of MT; no TTP over prox fib head Deformity: no Ecchymosis: no Tests negative: Anterior drawer, squeeze Neurologic: Normal sensory function. No focal deficits noted. No clonus. Psychiatric: Normal mood. Age appropriate judgment and insight. Alert & oriented x 3.    Assessment:  Injury of right ankle, initial encounter - Plan: DG Ankle Complete Right, celecoxib (CELEBREX) 100 MG capsule  Muscle spasm of back - Plan: celecoxib (CELEBREX) 100 MG capsule  Plan: Orders as above. Pt went down for XR but ended up declining due to billing issues. Will rx meds and offered crutches but she did not need/want.   Pt has had hysterectomy. Letter for work given. F/u in 1 week if symptoms worsen or fail to improve. The patient voiced understanding and agreement to the plan.   Jilda Roche [place], DO 03/20/16  10:53 AM

## 2016-03-20 NOTE — Progress Notes (Signed)
Pre visit review using our clinic review tool, if applicable. No additional management support is needed unless otherwise documented below in the visit note. 

## 2016-03-21 ENCOUNTER — Other Ambulatory Visit: Payer: Self-pay | Admitting: Physician Assistant

## 2016-03-21 DIAGNOSIS — M62830 Muscle spasm of back: Secondary | ICD-10-CM

## 2016-03-22 ENCOUNTER — Telehealth: Payer: Self-pay | Admitting: *Deleted

## 2016-03-22 NOTE — Telephone Encounter (Signed)
Received Prior Authorization from [employer] for Celecoxib 100mg .  PA initiated on [email or web address]  KEY:[id number] --PA Case [id number]  [id number]#:[id number]  Approved  Approved coverage start date:03/22/2016  Coverage end date:03/22/2017  Pharmacy notified of the approval.//AB/CMA

## 2016-03-22 NOTE — Telephone Encounter (Signed)
Rx request Denied per provider; Baclofen has remaining refill and Cyclobenzaprine is not px by provider/SLS 10/18

## 2016-03-24 ENCOUNTER — Telehealth: Payer: Self-pay | Admitting: *Deleted

## 2016-03-24 DIAGNOSIS — M62830 Muscle spasm of back: Secondary | ICD-10-CM

## 2016-03-24 MED ORDER — BACLOFEN 10 MG PO TABS: 10.0000 mg | ORAL_TABLET | Freq: Three times a day (TID) | ORAL | 0 refills | Status: DC | PRN

## 2016-03-24 NOTE — Telephone Encounter (Signed)
Rx request to pharmacy/SLS  

## 2016-05-26 DIAGNOSIS — R1011 Right upper quadrant pain: Secondary | ICD-10-CM | POA: Diagnosis not present

## 2016-06-02 ENCOUNTER — Ambulatory Visit: Payer: BLUE CROSS/BLUE SHIELD | Admitting: Medical

## 2016-06-15 ENCOUNTER — Telehealth: Payer: Self-pay | Admitting: Physician Assistant

## 2016-06-15 NOTE — Telephone Encounter (Signed)
ok 

## 2016-06-15 NOTE — Telephone Encounter (Signed)
[facility] due to [place] recent [place]. Please advise   Phone: [phone]

## 2016-06-15 NOTE — Telephone Encounter (Signed)
Ok with me 

## 2016-06-19 NOTE — Telephone Encounter (Signed)
Called to make patient aware that transfer of care was approved but VM is full.

## 2016-06-23 ENCOUNTER — Ambulatory Visit (INDEPENDENT_AMBULATORY_CARE_PROVIDER_SITE_OTHER): Payer: BLUE CROSS/BLUE SHIELD | Admitting: Family Medicine

## 2016-06-23 ENCOUNTER — Encounter: Payer: Self-pay | Admitting: Family Medicine

## 2016-06-23 VITALS — BP 126/82 | HR 90 | Temp 97.8°F | Ht 62.0 in | Wt 154.6 lb

## 2016-06-23 DIAGNOSIS — G8929 Other chronic pain: Secondary | ICD-10-CM

## 2016-06-23 DIAGNOSIS — M62830 Muscle spasm of back: Secondary | ICD-10-CM | POA: Diagnosis not present

## 2016-06-23 DIAGNOSIS — M546 Pain in thoracic spine: Secondary | ICD-10-CM

## 2016-06-23 LAB — MAGNESIUM: Magnesium: 2.2 mg/dL (ref 1.5–2.5)

## 2016-06-23 LAB — BASIC METABOLIC PANEL
BUN: 13 mg/dL (ref 7–25)
CHLORIDE: 104 mmol/L (ref 98–110)
CO2: 26 mmol/L (ref 20–31)
Calcium: 8.8 mg/dL (ref 8.6–10.2)
Creat: 0.79 mg/dL (ref 0.50–1.10)
GLUCOSE: 114 mg/dL — AB (ref 65–99)
Potassium: 4 mmol/L (ref 3.5–5.3)
Sodium: 137 mmol/L (ref 135–146)

## 2016-06-23 MED ORDER — CELECOXIB 100 MG PO CAPS: 100.0000 mg | ORAL_CAPSULE | Freq: Two times a day (BID) | ORAL | 1 refills | Status: DC | PRN

## 2016-06-23 MED ORDER — BACLOFEN 10 MG PO TABS: 10.0000 mg | ORAL_TABLET | Freq: Three times a day (TID) | ORAL | 0 refills | Status: DC | PRN

## 2016-06-23 NOTE — Progress Notes (Signed)
Pre visit review using our clinic review tool, if applicable. No additional management support is needed unless otherwise documented below in the visit note. 

## 2016-06-23 NOTE — Progress Notes (Signed)
Musculoskeletal Exam  Patient: Christine Griffith DOB: Jan 06, 1966  DOS: 06/23/2016  SUBJECTIVE:  Chief Complaint:   Chief Complaint  Patient presents with  . Spasms    Pt reports muscle spams since wednesday in back and radiating into the LT leg    Christine Griffith is a 51 y.o.  female for evaluation and treatment of her back pain.   Onset:  10 months ago.  Location: middle; also has associated R sided abd pain (neg US, EGD, colonoscopy, H2 blocker no change) Character:  sharp  Progression of issue:  is unchanged Associated symptoms: Spasms Denies bowel/bladder incontinence or weakness Treatment: to date has been muscle relaxers and Celebrex. These have been helpful. Wearing a brace is also helpful.   Neurovascular symptoms: no  She has also had intermittent cramping of her L thigh over the past 2 days.  She admittedly does not consume a lot of water. She typically drinks tea and Kool-Aid. She tries to stretch. She does have a hx of low mag, takes a supplement, unsure of how much.  ROS: Musculoskeletal/Extremities: +back pain Neurologic: no numbness, tingling no weakness   Past Medical History:  Diagnosis Date  . Chicken pox   . Environmental allergies   . GERD (gastroesophageal reflux disease)   . Glaucoma (increased eye pressure)    Borderline  . Seasonal allergies   . Vitamin D deficiency    Past Surgical History:  Procedure Laterality Date  . ABDOMINAL HYSTERECTOMY    . CESAREAN SECTION    . WISDOM TOOTH EXTRACTION     Family History  Problem Relation Age of Onset  . Hypertension Mother     Living  . Stroke Mother     multiple  . COPD Mother   . COPD Father [id number]    Deceased  . Bone cancer Paternal Grandmother   . Diabetes Mother   . Glaucoma Maternal Aunt   . Hypertension Brother     x3  . Heart disease Brother     x1  . Asthma Son   . Allergies Son   . Eczema Son     x1  . Eczema Daughter     x3   Current Outpatient Prescriptions  Medication Sig  Dispense Refill  . aspirin EC 81 MG tablet Take 81 mg by mouth daily as needed.     . baclofen (LIORESAL) 10 MG tablet Take 1 tablet (10 mg total) by mouth 3 (three) times daily as needed. 90 tablet 0  . celecoxib (CELEBREX) 100 MG capsule Take 1 capsule (100 mg total) by mouth 2 (two) times daily as needed. 30 capsule 0  . clobetasol cream (TEMOVATE) 0.05 % Apply 1 application topically 2 (two) times daily. 30 g 0  . co-enzyme Q-10 50 MG capsule Take 100 mg by mouth.    . dicyclomine (BENTYL) 10 MG capsule Take 1 capsule (10 mg total) by mouth 3 (three) times daily before meals. (Patient taking differently: Take 10 mg by mouth 3 (three) times daily as needed. ) 90 capsule 1  . fish oil-omega-3 fatty acids 1000 MG capsule Take 1 g by mouth every other day.     . fluticasone (FLONASE) 50 MCG/ACT nasal spray Place 2 sprays into both nostrils daily. 16 g 6  . loratadine (CLARITIN) 10 MG tablet Take 10 mg by mouth daily as needed.     . lovastatin (MEVACOR) 20 MG tablet Take 1 tablet (20 mg total) by mouth at bedtime. 30 tablet 3  . Multiple  Vitamin (MULTIVITAMIN) tablet Take 1 tablet by mouth daily.      Marland Kitchen omeprazole (PRILOSEC) 20 MG capsule Take 1 capsule (20 mg total) by mouth daily. 30 capsule 3  . ranitidine (ZANTAC) 150 MG tablet Take 1 tablet (150 mg total) by mouth 2 (two) times daily. 60 tablet 3   Allergies  Allergen Reactions  . Codeine Nausea And Vomiting  . Prednisone Nausea And Vomiting  . Hydrocodone Nausea And Vomiting   Social History   Social History  . Marital status: Legally Separated   Social History Main Topics  . Smoking status: Never Smoker  . Smokeless tobacco: Never Used  . Alcohol use Yes     Comment: rare  . Drug use: No   Objective:  VITAL SIGNS: BP 126/82 (BP Location: Left Arm, Patient Position: Sitting, Cuff Size: Small)   Pulse 90   Temp 97.8 F (36.6 C) (Oral)   Ht 5\' 2"  (1.575 m)   Wt 154 lb 9.6 oz (70.1 kg)   SpO2 90%   BMI 28.28 kg/m   Constitutional: Well formed, well developed. No acute distress. HENT: Normocephalic, atraumatic.  Moist mucous membranes.  Eyes:  EOM grossly intact. Conjunctiva normal.  Neck:  Full range of motion.   Cardiovascular: RRR, no murmurs Thorax & Lungs:  CTAB, no wheezing or rales  Extremities: No clubbing. No cyanosis. No edema.  Skin: Warm. Dry. No erythema. No rash.  Abd: Soft, NT, ND, no masses or organomegaly Musculoskeletal: low back.   Tenderness to palpation: yes - over ES group on the R around T8-10 Deformity: no Ecchymosis: no Straight leg test: negative for- tight hamstrings, worse on R Neurologic: Normal sensory function. No focal deficits noted. DTR's equal and symmetry in LE's. No clonus. Psychiatric: Normal mood. Age appropriate judgment and insight. Alert & oriented x 3.    Assessment:  Chronic right-sided thoracic back pain - Plan: Ambulatory referral to Physical Therapy, celecoxib (CELEBREX) 100 MG capsule, Basic Metabolic Panel (BMET), Magnesium  Muscle spasm of back - Plan: celecoxib (CELEBREX) 100 MG capsule, baclofen (LIORESAL) 10 MG tablet, Basic Metabolic Panel (BMET), Magnesium, CANCELED: Magnesium, CANCELED: Basic Metabolic Panel (BMET)  Plan: Orders as above. Given hx of improvement with medication and back brace, appears to be msk cause of abdominal pain. Home stretches and exercises given. Heat. Do what she can tolerate. PT given this duration. If cost is an issue, I will see her routinely for OMT. F/u as originally scheduled with her new PCP Dr. Patsy Lager. The patient voiced understanding and agreement to the plan.   Jilda Roche [place], DO 06/23/16  3:09 PM

## 2016-06-23 NOTE — Telephone Encounter (Signed)
Patient was seen 06/23/16 by Dr. Carmelia RollerWendling and is requesting to transfer care to him instead, please advise  Phone: [phone]

## 2016-06-23 NOTE — Telephone Encounter (Signed)
OK 

## 2016-06-23 NOTE — Patient Instructions (Signed)
Heat, stretching that you can tolerate.   If you don't hear anything about your physical therapy referral in 1-2 weeks, call our office for an update.  EXERCISES  RANGE OF MOTION (ROM) AND STRETCHING EXERCISES - Low Back Sprain Most people with lower back pain will find that their symptoms get worse with excessive bending forward (flexion) or arching at the lower back (extension). The exercises that will help resolve your symptoms will focus on the opposite motion.  Your physician, physical therapist or athletic trainer will help you determine which exercises will be most helpful to resolve your lower back pain. Do not complete any exercises without first consulting with your caregiver. Discontinue any exercises which make your symptoms worse, until you speak to your caregiver. If you have pain, numbness or tingling which travels down into your buttocks, leg or foot, the goal of the therapy is for these symptoms to move closer to your back and eventually resolve. Sometimes, these leg symptoms will get better, but your lower back pain may worsen. This is often an indication of progress in your rehabilitation. Be very alert to any changes in your symptoms and the activities in which you participated in the 24 hours prior to the change. Sharing this information with your caregiver will allow him or her to most efficiently treat your condition. These exercises may help you when beginning to rehabilitate your injury. Your symptoms may resolve with or without further involvement from your physician, physical therapist or athletic trainer. While completing these exercises, remember:   Restoring tissue flexibility helps normal motion to return to the joints. This allows healthier, less painful movement and activity.  An effective stretch should be held for at least 30 seconds.  A stretch should never be painful. You should only feel a gentle lengthening or release in the stretched tissue. FLEXION RANGE OF  MOTION AND STRETCHING EXERCISES:  STRETCH - Flexion, Single Knee to Chest   Lie on a firm bed or floor with both legs extended in front of you.  Keeping one leg in contact with the floor, bring your opposite knee to your chest. Hold your leg in place by either grabbing behind your thigh or at your knee.  Pull until you feel a gentle stretch in your low back. Hold 15-20 seconds.  Slowly release your grasp and repeat the exercise with the opposite side. Repeat 2 times. Complete this exercise 1-2 times per day.   STRETCH - Flexion, Double Knee to Chest  Lie on a firm bed or floor with both legs extended in front of you.  Keeping one leg in contact with the floor, bring your opposite knee to your chest.  Tense your stomach muscles to support your back and then lift your other knee to your chest. Hold your legs in place by either grabbing behind your thighs or at your knees.  Pull both knees toward your chest until you feel a gentle stretch in your low back. Hold 15-20 seconds.  Tense your stomach muscles and slowly return one leg at a time to the floor. Repeat 2 times. Complete this exercise 1-2 times per day.   STRETCH - Low Trunk Rotation  Lie on a firm bed or floor. Keeping your legs in front of you, bend your knees so they are both pointed toward the ceiling and your feet are flat on the floor.  Extend your arms out to the side. This will stabilize your upper body by keeping your shoulders in contact with the floor.  Gently and slowly drop both knees together to one side until you feel a gentle stretch in your low back. Hold for 15-20 seconds.  Tense your stomach muscles to support your lower back as you bring your knees back to the starting position. Repeat the exercise to the other side. Repeat 2 times. Complete this exercise 1-2 times per day  EXTENSION RANGE OF MOTION AND FLEXIBILITY EXERCISES:  STRETCH - Extension, Prone on Elbows   Lie on your stomach on the floor, a bed  will be too soft. Place your palms about shoulder width apart and at the height of your head.  Place your elbows under your shoulders. If this is too painful, stack pillows under your chest.  Allow your body to relax so that your hips drop lower and make contact more completely with the floor.  Hold this position for 15-20 seconds.  Slowly return to lying flat on the floor. Repeat 2 times. Complete this exercise 1-2 times per day.   RANGE OF MOTION - Extension, Prone Press Ups  Lie on your stomach on the floor, a bed will be too soft. Place your palms about shoulder width apart and at the height of your head.  Keeping your back as relaxed as possible, slowly straighten your elbows while keeping your hips on the floor. You may adjust the placement of your hands to maximize your comfort. As you gain motion, your hands will come more underneath your shoulders.  Hold this position 15-20 seconds.  Slowly return to lying flat on the floor. Repeat 2 times. Complete this exercise 1-2 times per day.   RANGE OF MOTION- Quadruped, Neutral Spine   Assume a hands and knees position on a firm surface. Keep your hands under your shoulders and your knees under your hips. You may place padding under your knees for comfort.  Drop your head and point your tailbone toward the ground below you. This will round out your lower back like an angry cat. Hold this position for 15-20 seconds.  Slowly lift your head and release your tail bone so that your back sags into a large arch, like an old horse.  Hold this position for 15-20 seconds.  Repeat this until you feel limber in your low back.  Now, find your "sweet spot." This will be the most comfortable position somewhere between the two previous positions. This is your neutral spine. Once you have found this position, tense your stomach muscles to support your low back.  Hold this position for 15-20 seconds. Repeat 2 times. Complete this exercise 1-2  times per day.  STRENGTHENING EXERCISES - Low Back Sprain These exercises may help you when beginning to rehabilitate your injury. These exercises should be done near your "sweet spot." This is the neutral, low-back arch, somewhere between fully rounded and fully arched, that is your least painful position. When performed in this safe range of motion, these exercises can be used for people who have either a flexion or extension based injury. These exercises may resolve your symptoms with or without further involvement from your physician, physical therapist or athletic trainer. While completing these exercises, remember:   Muscles can gain both the endurance and the strength needed for everyday activities through controlled exercises.  Complete these exercises as instructed by your physician, physical therapist or athletic trainer. Increase the resistance and repetitions only as guided.  You may experience muscle soreness or fatigue, but the pain or discomfort you are trying to eliminate should never worsen during  these exercises. If this pain does worsen, stop and make certain you are following the directions exactly. If the pain is still present after adjustments, discontinue the exercise until you can discuss the trouble with your caregiver.  STRENGTHENING - Deep Abdominals, Pelvic Tilt   Lie on a firm bed or floor. Keeping your legs in front of you, bend your knees so they are both pointed toward the ceiling and your feet are flat on the floor.  Tense your lower abdominal muscles to press your low back into the floor. This motion will rotate your pelvis so that your tail bone is scooping upwards rather than pointing at your feet or into the floor. With a gentle tension and even breathing, hold this position for 10-15 seconds. Repeat 2 times. Complete this exercise 1 time per day.   STRENGTHENING - Abdominals, Crunches   Lie on a firm bed or floor. Keeping your legs in front of you, bend your  knees so they are both pointed toward the ceiling and your feet are flat on the floor. Cross your arms over your chest.  Slightly tip your chin down without bending your neck.  Tense your abdominals and slowly lift your trunk high enough to just clear your shoulder blades. Lifting higher can put excessive stress on the lower back and does not further strengthen your abdominal muscles.  Control your return to the starting position. Repeat 2 times. Complete this exercise once every 1-2 days.   STRENGTHENING - Quadruped, Opposite UE/LE Lift   Assume a hands and knees position on a firm surface. Keep your hands under your shoulders and your knees under your hips. You may place padding under your knees for comfort.  Find your neutral spine and gently tense your abdominal muscles so that you can maintain this position. Your shoulders and hips should form a rectangle that is parallel with the floor and is not twisted.  Keeping your trunk steady, lift your right hand no higher than your shoulder and then your left leg no higher than your hip. Make sure you are not holding your breath. Hold this position for 15-20 seconds.  Continuing to keep your abdominal muscles tense and your back steady, slowly return to your starting position. Repeat with the opposite arm and leg. Repeat 2 times. Complete this exercise once every 1-2 days.   STRENGTHENING - Abdominals and Quadriceps, Straight Leg Raise   Lie on a firm bed or floor with both legs extended in front of you.  Keeping one leg in contact with the floor, bend the other knee so that your foot can rest flat on the floor.  Find your neutral spine, and tense your abdominal muscles to maintain your spinal position throughout the exercise.  Slowly lift your straight leg off the floor about 6 inches for a count of 15, making sure to not hold your breath.  Still keeping your neutral spine, slowly lower your leg all the way to the floor. Repeat this  exercise with each leg 2 times. Complete this exercise once every 1-2 days. POSTURE AND BODY MECHANICS CONSIDERATIONS - Low Back Sprain Keeping correct posture when sitting, standing or completing your activities will reduce the stress put on different body tissues, allowing injured tissues a chance to heal and limiting painful experiences. The following are general guidelines for improved posture. Your physician or physical therapist will provide you with any instructions specific to your needs. While reading these guidelines, remember:  The exercises prescribed by your provider will  help you have the flexibility and strength to maintain correct postures.  The correct posture provides the best environment for your joints to work. All of your joints have less wear and tear when properly supported by a spine with good posture. This means you will experience a healthier, less painful body.  Correct posture must be practiced with all of your activities, especially prolonged sitting and standing. Correct posture is as important when doing repetitive low-stress activities (typing) as it is when doing a single heavy-load activity (lifting).  RESTING POSITIONS Consider which positions are most painful for you when choosing a resting position. If you have pain with flexion-based activities (sitting, bending, stooping, squatting), choose a position that allows you to rest in a less flexed posture. You would want to avoid curling into a fetal position on your side. If your pain worsens with extension-based activities (prolonged standing, working overhead), avoid resting in an extended position such as sleeping on your stomach. Most people will find more comfort when they rest with their spine in a more neutral position, neither too rounded nor too arched. Lying on a non-sagging bed on your side with a pillow between your knees, or on your back with a pillow under your knees will often provide some relief. Keep in  mind, being in any one position for a prolonged period of time, no matter how correct your posture, can still lead to stiffness. PROPER SITTING POSTURE In order to minimize stress and discomfort on your spine, you must sit with correct posture. Sitting with good posture should be effortless for a healthy body. Returning to good posture is a gradual process. Many people can work toward this most comfortably by using various supports until they have the flexibility and strength to maintain this posture on their own. When sitting with proper posture, your ears will fall over your shoulders and your shoulders will fall over your hips. You should use the back of the chair to support your upper back. Your lower back will be in a neutral position, just slightly arched. You may place a small pillow or folded towel at the base of your lower back for  support.  When working at a desk, create an environment that supports good, upright posture. Without extra support, muscles tire, which leads to excessive strain on joints and other tissues. Keep these recommendations in mind:  CHAIR:  A chair should be able to slide under your desk when your back makes contact with the back of the chair. This allows you to work closely.  The chair's height should allow your eyes to be level with the upper part of your monitor and your hands to be slightly lower than your elbows.  BODY POSITION  Your feet should make contact with the floor. If this is not possible, use a foot rest.  Keep your ears over your shoulders. This will reduce stress on your neck and low back.  INCORRECT SITTING POSTURES  If you are feeling tired and unable to assume a healthy sitting posture, do not slouch or slump. This puts excessive strain on your back tissues, causing more damage and pain. Healthier options include:  Using more support, like a lumbar pillow.  Switching tasks to something that requires you to be upright or walking.  Talking  a brief walk.  Lying down to rest in a neutral-spine position.  PROLONGED STANDING WHILE SLIGHTLY LEANING FORWARD  When completing a task that requires you to lean forward while standing in one place for  a long time, place either foot up on a stationary 2-4 inch high object to help maintain the best posture. When both feet are on the ground, the lower back tends to lose its slight inward curve. If this curve flattens (or becomes too large), then the back and your other joints will experience too much stress, tire more quickly, and can cause pain.  CORRECT STANDING POSTURES Proper standing posture should be assumed with all daily activities, even if they only take a few moments, like when brushing your teeth. As in sitting, your ears should fall over your shoulders and your shoulders should fall over your hips. You should keep a slight tension in your abdominal muscles to brace your spine. Your tailbone should point down to the ground, not behind your body, resulting in an over-extended swayback posture.   INCORRECT STANDING POSTURES  Common incorrect standing postures include a forward head, locked knees and/or an excessive swayback. WALKING Walk with an upright posture. Your ears, shoulders and hips should all line-up.  PROLONGED ACTIVITY IN A FLEXED POSITION When completing a task that requires you to bend forward at your waist or lean over a low surface, try to find a way to stabilize 3 out of 4 of your limbs. You can place a hand or elbow on your thigh or rest a knee on the surface you are reaching across. This will provide you more stability, so that your muscles do not tire as quickly. By keeping your knees relaxed, or slightly bent, you will also reduce stress across your lower back. CORRECT LIFTING TECHNIQUES  DO :  Assume a wide stance. This will provide you more stability and the opportunity to get as close as possible to the object which you are lifting.  Tense your abdominals to  brace your spine. Bend at the knees and hips. Keeping your back locked in a neutral-spine position, lift using your leg muscles. Lift with your legs, keeping your back straight.  Test the weight of unknown objects before attempting to lift them.  Try to keep your elbows locked down at your sides in order get the best strength from your shoulders when carrying an object.     Always ask for help when lifting heavy or awkward objects. INCORRECT LIFTING TECHNIQUES DO NOT:   Lock your knees when lifting, even if it is a small object.  Bend and twist. Pivot at your feet or move your feet when needing to change directions.  Assume that you can safely pick up even a paperclip without proper posture.

## 2016-06-26 NOTE — Telephone Encounter (Signed)
Appt scheduled for 07/06/16

## 2016-07-06 ENCOUNTER — Ambulatory Visit (INDEPENDENT_AMBULATORY_CARE_PROVIDER_SITE_OTHER): Payer: BLUE CROSS/BLUE SHIELD | Admitting: Family Medicine

## 2016-07-06 ENCOUNTER — Ambulatory Visit: Payer: BLUE CROSS/BLUE SHIELD | Admitting: Family Medicine

## 2016-07-06 ENCOUNTER — Encounter: Payer: Self-pay | Admitting: Family Medicine

## 2016-07-06 VITALS — BP 124/71 | HR 91 | Temp 97.8°F | Ht 62.0 in | Wt 153.0 lb

## 2016-07-06 DIAGNOSIS — H6983 Other specified disorders of Eustachian tube, bilateral: Secondary | ICD-10-CM | POA: Diagnosis not present

## 2016-07-06 NOTE — Patient Instructions (Signed)
Start using Flonase again.  If you have issues with ear wax, do not use q-tips. Use Debrox (peroxide) and a bulb syringe to flush out your ear.

## 2016-07-06 NOTE — Progress Notes (Signed)
Chief Complaint  Patient presents with  . Transitions Of Care    follow-up on back pain, check ears-congested    Subjective: Patient is a 51 y.o. female here for ear congestion.  B/l ear congestion, she is a known user of Q-tips. Has been having some URI symptoms. No fevers or sinus pain. No SOB.   ROS: Lungs: Denies SOB   Family History  Problem Relation Age of Onset  . Hypertension Mother     Living  . Stroke Mother     multiple  . COPD Mother   . COPD Father [id number]    Deceased  . Bone cancer Paternal Grandmother   . Diabetes Mother   . Glaucoma Maternal Aunt   . Hypertension Brother     x3  . Heart disease Brother     x1  . Asthma Son   . Allergies Son   . Eczema Son     x1  . Eczema Daughter     x3   Past Medical History:  Diagnosis Date  . Chicken pox   . Environmental allergies   . GERD (gastroesophageal reflux disease)   . Glaucoma (increased eye pressure)    Borderline  . Seasonal allergies   . Vitamin D deficiency    Allergies  Allergen Reactions  . Codeine Nausea And Vomiting  . Prednisone Nausea And Vomiting  . Hydrocodone Nausea And Vomiting    Current Outpatient Prescriptions:  .  aspirin EC 81 MG tablet, Take 81 mg by mouth daily as needed. , Disp: , Rfl:  .  baclofen (LIORESAL) 10 MG tablet, Take 1 tablet (10 mg total) by mouth 3 (three) times daily as needed., Disp: 90 tablet, Rfl: 0 .  celecoxib (CELEBREX) 100 MG capsule, Take 1 capsule (100 mg total) by mouth 2 (two) times daily as needed., Disp: 30 capsule, Rfl: 1 .  clobetasol cream (TEMOVATE) 0.05 %, Apply 1 application topically 2 (two) times daily., Disp: 30 g, Rfl: 0 .  co-enzyme Q-10 50 MG capsule, Take 100 mg by mouth., Disp: , Rfl:  .  dicyclomine (BENTYL) 10 MG capsule, Take 1 capsule (10 mg total) by mouth 3 (three) times daily before meals. (Patient taking differently: Take 10 mg by mouth 3 (three) times daily as needed. ), Disp: 90 capsule, Rfl: 1 .  fish oil-omega-3 fatty acids  1000 MG capsule, Take 1 g by mouth every other day. , Disp: , Rfl:  .  fluticasone (FLONASE) 50 MCG/ACT nasal spray, Place 2 sprays into both nostrils daily., Disp: 16 g, Rfl: 6 .  loratadine (CLARITIN) 10 MG tablet, Take 10 mg by mouth daily as needed. , Disp: , Rfl:  .  lovastatin (MEVACOR) 20 MG tablet, Take 1 tablet (20 mg total) by mouth at bedtime., Disp: 30 tablet, Rfl: 3 .  Multiple Vitamin (MULTIVITAMIN) tablet, Take 1 tablet by mouth daily.  , Disp: , Rfl:  .  omeprazole (PRILOSEC) 20 MG capsule, Take 1 capsule (20 mg total) by mouth daily., Disp: 30 capsule, Rfl: 3  Objective: BP 124/71 (BP Location: Left Arm, Patient Position: Sitting, Cuff Size: Normal)   Pulse 91   Temp 97.8 F (36.6 C) (Oral)   Ht 5\' 2"  (1.575 m)   Wt 153 lb (69.4 kg)   SpO2 100%   BMI 27.98 kg/m  General: Awake, appears stated age HEENT: MMM, EOMi, no sinus tenderness, ears patent b/l, TM's neg, nares patent Heart: RRR, no murmurs Lungs: CTAB, no rales, wheezes or rhonchi.  No accessory muscle use Psych: Age appropriate judgment and insight, normal affect and mood  Assessment and Plan: Dysfunction of both eustachian tubes  Given hx, likely related to ETD. Start Flonase again. Discussed proper way to remove ear wax and counseled against the use of Q-tips. F/u prn. The patient voiced understanding and agreement to the plan.  Jilda Roche [place], DO 07/06/16  4:17 PM

## 2016-07-21 DIAGNOSIS — M6250 Muscle wasting and atrophy, not elsewhere classified, unspecified site: Secondary | ICD-10-CM | POA: Diagnosis not present

## 2016-07-21 DIAGNOSIS — M5413 Radiculopathy, cervicothoracic region: Secondary | ICD-10-CM | POA: Diagnosis not present

## 2016-07-21 DIAGNOSIS — M542 Cervicalgia: Secondary | ICD-10-CM | POA: Diagnosis not present

## 2016-07-21 DIAGNOSIS — M546 Pain in thoracic spine: Secondary | ICD-10-CM | POA: Diagnosis not present

## 2016-07-24 DIAGNOSIS — M542 Cervicalgia: Secondary | ICD-10-CM | POA: Diagnosis not present

## 2016-07-24 DIAGNOSIS — M5413 Radiculopathy, cervicothoracic region: Secondary | ICD-10-CM | POA: Diagnosis not present

## 2016-07-24 DIAGNOSIS — M6250 Muscle wasting and atrophy, not elsewhere classified, unspecified site: Secondary | ICD-10-CM | POA: Diagnosis not present

## 2016-07-24 DIAGNOSIS — M546 Pain in thoracic spine: Secondary | ICD-10-CM | POA: Diagnosis not present

## 2016-07-27 DIAGNOSIS — M6250 Muscle wasting and atrophy, not elsewhere classified, unspecified site: Secondary | ICD-10-CM | POA: Diagnosis not present

## 2016-07-27 DIAGNOSIS — M546 Pain in thoracic spine: Secondary | ICD-10-CM | POA: Diagnosis not present

## 2016-07-27 DIAGNOSIS — M542 Cervicalgia: Secondary | ICD-10-CM | POA: Diagnosis not present

## 2016-07-27 DIAGNOSIS — M5413 Radiculopathy, cervicothoracic region: Secondary | ICD-10-CM | POA: Diagnosis not present

## 2016-08-03 DIAGNOSIS — M5413 Radiculopathy, cervicothoracic region: Secondary | ICD-10-CM | POA: Diagnosis not present

## 2016-08-03 DIAGNOSIS — M542 Cervicalgia: Secondary | ICD-10-CM | POA: Diagnosis not present

## 2016-08-03 DIAGNOSIS — M546 Pain in thoracic spine: Secondary | ICD-10-CM | POA: Diagnosis not present

## 2016-08-03 DIAGNOSIS — M6250 Muscle wasting and atrophy, not elsewhere classified, unspecified site: Secondary | ICD-10-CM | POA: Diagnosis not present

## 2016-08-04 DIAGNOSIS — M546 Pain in thoracic spine: Secondary | ICD-10-CM | POA: Diagnosis not present

## 2016-08-04 DIAGNOSIS — M6250 Muscle wasting and atrophy, not elsewhere classified, unspecified site: Secondary | ICD-10-CM | POA: Diagnosis not present

## 2016-08-04 DIAGNOSIS — M5413 Radiculopathy, cervicothoracic region: Secondary | ICD-10-CM | POA: Diagnosis not present

## 2016-08-04 DIAGNOSIS — M542 Cervicalgia: Secondary | ICD-10-CM | POA: Diagnosis not present

## 2016-08-09 ENCOUNTER — Telehealth: Payer: Self-pay | Admitting: *Deleted

## 2016-08-10 DIAGNOSIS — M6250 Muscle wasting and atrophy, not elsewhere classified, unspecified site: Secondary | ICD-10-CM | POA: Diagnosis not present

## 2016-08-10 DIAGNOSIS — M542 Cervicalgia: Secondary | ICD-10-CM | POA: Diagnosis not present

## 2016-08-10 DIAGNOSIS — M546 Pain in thoracic spine: Secondary | ICD-10-CM | POA: Diagnosis not present

## 2016-08-10 DIAGNOSIS — M5413 Radiculopathy, cervicothoracic region: Secondary | ICD-10-CM | POA: Diagnosis not present

## 2016-08-10 NOTE — Telephone Encounter (Signed)
Received signed Plan of Care from Dr.Wendling.  [facility] [phone]).  Confirmation received.  Sent for scanning.//AB/CMA

## 2016-08-24 DIAGNOSIS — M542 Cervicalgia: Secondary | ICD-10-CM | POA: Diagnosis not present

## 2016-08-24 DIAGNOSIS — M546 Pain in thoracic spine: Secondary | ICD-10-CM | POA: Diagnosis not present

## 2016-08-24 DIAGNOSIS — M6250 Muscle wasting and atrophy, not elsewhere classified, unspecified site: Secondary | ICD-10-CM | POA: Diagnosis not present

## 2016-08-24 DIAGNOSIS — M5413 Radiculopathy, cervicothoracic region: Secondary | ICD-10-CM | POA: Diagnosis not present

## 2016-08-31 DIAGNOSIS — M546 Pain in thoracic spine: Secondary | ICD-10-CM | POA: Diagnosis not present

## 2016-08-31 DIAGNOSIS — M6250 Muscle wasting and atrophy, not elsewhere classified, unspecified site: Secondary | ICD-10-CM | POA: Diagnosis not present

## 2016-08-31 DIAGNOSIS — M5413 Radiculopathy, cervicothoracic region: Secondary | ICD-10-CM | POA: Diagnosis not present

## 2016-08-31 DIAGNOSIS — M542 Cervicalgia: Secondary | ICD-10-CM | POA: Diagnosis not present

## 2016-09-07 DIAGNOSIS — M6250 Muscle wasting and atrophy, not elsewhere classified, unspecified site: Secondary | ICD-10-CM | POA: Diagnosis not present

## 2016-09-07 DIAGNOSIS — M546 Pain in thoracic spine: Secondary | ICD-10-CM | POA: Diagnosis not present

## 2016-09-07 DIAGNOSIS — M5413 Radiculopathy, cervicothoracic region: Secondary | ICD-10-CM | POA: Diagnosis not present

## 2016-09-07 DIAGNOSIS — M542 Cervicalgia: Secondary | ICD-10-CM | POA: Diagnosis not present

## 2016-09-14 DIAGNOSIS — M5413 Radiculopathy, cervicothoracic region: Secondary | ICD-10-CM | POA: Diagnosis not present

## 2016-09-14 DIAGNOSIS — M546 Pain in thoracic spine: Secondary | ICD-10-CM | POA: Diagnosis not present

## 2016-09-14 DIAGNOSIS — M542 Cervicalgia: Secondary | ICD-10-CM | POA: Diagnosis not present

## 2016-09-14 DIAGNOSIS — M6250 Muscle wasting and atrophy, not elsewhere classified, unspecified site: Secondary | ICD-10-CM | POA: Diagnosis not present

## 2016-09-29 DIAGNOSIS — M6250 Muscle wasting and atrophy, not elsewhere classified, unspecified site: Secondary | ICD-10-CM | POA: Diagnosis not present

## 2016-09-29 DIAGNOSIS — M542 Cervicalgia: Secondary | ICD-10-CM | POA: Diagnosis not present

## 2016-09-29 DIAGNOSIS — M5413 Radiculopathy, cervicothoracic region: Secondary | ICD-10-CM | POA: Diagnosis not present

## 2016-09-29 DIAGNOSIS — M546 Pain in thoracic spine: Secondary | ICD-10-CM | POA: Diagnosis not present

## 2016-11-02 ENCOUNTER — Other Ambulatory Visit: Payer: Self-pay | Admitting: Obstetrics and Gynecology

## 2016-11-02 DIAGNOSIS — R5381 Other malaise: Secondary | ICD-10-CM

## 2016-11-07 ENCOUNTER — Other Ambulatory Visit: Payer: Self-pay | Admitting: Obstetrics and Gynecology

## 2016-11-07 DIAGNOSIS — N63 Unspecified lump in unspecified breast: Secondary | ICD-10-CM

## 2016-11-09 ENCOUNTER — Other Ambulatory Visit: Payer: Self-pay | Admitting: Obstetrics and Gynecology

## 2016-11-09 DIAGNOSIS — Z1231 Encounter for screening mammogram for malignant neoplasm of breast: Secondary | ICD-10-CM

## 2016-11-30 ENCOUNTER — Ambulatory Visit
Admission: RE | Admit: 2016-11-30 | Discharge: 2016-11-30 | Disposition: A | Discharge status: Home / Self Care | Payer: BLUE CROSS/BLUE SHIELD | Source: Ambulatory Visit | Attending: Obstetrics and Gynecology | Admitting: Obstetrics and Gynecology

## 2016-11-30 ENCOUNTER — Encounter (INDEPENDENT_AMBULATORY_CARE_PROVIDER_SITE_OTHER): Payer: Self-pay

## 2016-11-30 ENCOUNTER — Other Ambulatory Visit: Payer: Self-pay | Admitting: Obstetrics and Gynecology

## 2016-11-30 DIAGNOSIS — Z1231 Encounter for screening mammogram for malignant neoplasm of breast: Secondary | ICD-10-CM

## 2016-12-11 ENCOUNTER — Ambulatory Visit (INDEPENDENT_AMBULATORY_CARE_PROVIDER_SITE_OTHER): Payer: BLUE CROSS/BLUE SHIELD | Admitting: Family Medicine

## 2016-12-11 ENCOUNTER — Encounter: Payer: Self-pay | Admitting: Family Medicine

## 2016-12-11 VITALS — BP 120/82 | HR 78 | Temp 98.4°F | Ht 62.0 in | Wt 154.0 lb

## 2016-12-11 DIAGNOSIS — M792 Neuralgia and neuritis, unspecified: Secondary | ICD-10-CM | POA: Diagnosis not present

## 2016-12-11 DIAGNOSIS — R29898 Other symptoms and signs involving the musculoskeletal system: Secondary | ICD-10-CM

## 2016-12-11 MED ORDER — DULOXETINE HCL 30 MG PO CPEP: 30.0000 mg | ORAL_CAPSULE | Freq: Every day | ORAL | 1 refills | Status: DC

## 2016-12-11 NOTE — Patient Instructions (Signed)
If you do not hear anything about your referral in the next week, call our office and ask for an update.  Let us know if anything changes.  Do your home exercise program!

## 2016-12-11 NOTE — Progress Notes (Signed)
Chief Complaint  Patient presents with  . Fatigue    left side    Subjective: Patient is a 51 y.o. female here for left sided weakness.  Pt has been having worsening burning pain over the lateral left UE and anterior LLE all the way down to her toes. She has been having weakness of her Left side since January, but got worse over the past 2 weeks. No injury or change in activity. She was seeing PT and it was helpful, but very expensive. Her home exercise program she was not routinely doing. She feels weak on the L. She does not have balance issues, numbness, tingling, fevers, vision changes, headaches, difficulty swallowing, trouble with speech, or loss of bowel/bladder function. She does not have a hx of a stroke. Non-smoker, no stroke risk factors.  ROS: MSK: +weakness Neuro: No numbness or tingling  Family History  Problem Relation Age of Onset  . Hypertension Mother        Living  . Stroke Mother        multiple  . COPD Mother   . Diabetes Mother   . COPD Father 1667       Deceased  . Bone cancer Paternal Grandmother   . Glaucoma Maternal Aunt   . Hypertension Brother        x3  . Heart disease Brother        x1  . Asthma Son   . Allergies Son   . Eczema Son        x1  . Eczema Daughter        x3   Past Medical History:  Diagnosis Date  . Chicken pox   . Environmental allergies   . GERD (gastroesophageal reflux disease)   . Glaucoma (increased eye pressure)    Borderline  . Seasonal allergies   . Vitamin D deficiency    Allergies  Allergen Reactions  . Codeine Nausea And Vomiting  . Prednisone Nausea And Vomiting  . Hydrocodone Nausea And Vomiting    Current Outpatient Prescriptions:  .  baclofen (LIORESAL) 10 MG tablet, Take 1 tablet (10 mg total) by mouth 3 (three) times daily as needed., Disp: 90 tablet, Rfl: 0 .  celecoxib (CELEBREX) 100 MG capsule, Take 1 capsule (100 mg total) by mouth 2 (two) times daily as needed., Disp: 30 capsule, Rfl: 1 .   co-enzyme Q-10 50 MG capsule, Take 100 mg by mouth., Disp: , Rfl:  .  dicyclomine (BENTYL) 10 MG capsule, Take 1 capsule (10 mg total) by mouth 3 (three) times daily before meals. (Patient taking differently: Take 10 mg by mouth 3 (three) times daily as needed. ), Disp: 90 capsule, Rfl: 1 .  fish oil-omega-3 fatty acids 1000 MG capsule, Take 1 g by mouth every other day. , Disp: , Rfl:  .  fluticasone (FLONASE) 50 MCG/ACT nasal spray, Place 2 sprays into both nostrils daily., Disp: 16 g, Rfl: 6 .  loratadine (CLARITIN) 10 MG tablet, Take 10 mg by mouth daily as needed. , Disp: , Rfl:  .  lovastatin (MEVACOR) 20 MG tablet, Take 1 tablet (20 mg total) by mouth at bedtime., Disp: 30 tablet, Rfl: 3 .  Multiple Vitamin (MULTIVITAMIN) tablet, Take 1 tablet by mouth daily.  , Disp: , Rfl:  .  omeprazole (PRILOSEC) 20 MG capsule, Take 1 capsule (20 mg total) by mouth daily., Disp: 30 capsule, Rfl: 3 .  DULoxetine (CYMBALTA) 30 MG capsule, Take 1 capsule (30 mg total) by mouth  daily., Disp: 30 capsule, Rfl: 1  Objective: BP 120/82 (BP Location: Right Arm, Patient Position: Sitting, Cuff Size: Normal)   Pulse 78   Temp 98.4 F (36.9 C) (Oral)   Ht 5\' 2"  (1.575 m)   Wt 154 lb (69.9 kg)   SpO2 98%   BMI 28.17 kg/m  General: Awake, appears stated age HEENT: MMM, EOMi, mild subconj hemorrhage on L lateral eye sparing limbus, PERRLA Heart: RRR, no murmurs Lungs: CTAB, no rales, wheezes or rhonchi. No accessory muscle use MSK: 4/5 strength grip, ext rotation, extension of LUE, 5/5 strength of RUE, 4/5 strength with L knee flexion, 55 strength LLE and RLE otherwise.  Neuro: 2/4 patellar and biceps reflex b/l, 1/4 calcaneal reflex b/l, no clonus, no cerebellar signs, sensation intact on RUE and RLE, decreased sensation to pinprick over L ant thigh, rest of LLE WNL, decreased sensation over L deltoid, finger tips of 1st, 3rd and 5th digit on L, intact on R Psych: Age appropriate judgment and insight, normal  affect and mood  Assessment and Plan: Weakness of left lower extremity - Plan: Ambulatory referral to Neurology  Upper extremity weakness - Plan: Ambulatory referral to Neurology  Neuropathic pain - Plan: DULoxetine (CYMBALTA) 30 MG capsule  Orders as above. Will refer to Neuro for possible EMG vs other imaging of Tspine, Cspine, head and/or lumbar.  Start SNRI for neuropathic pain and chronic low back pain.  F/u in 4 weeks to see how she is doing with the medicine. Could trial gabapentin vs amitriptyline at that point. The patient voiced understanding and agreement to the plan.  Christine Griffith [place], DO 12/11/16  5:01 PM

## 2016-12-20 ENCOUNTER — Telehealth: Payer: Self-pay | Admitting: Family Medicine

## 2016-12-20 NOTE — Telephone Encounter (Signed)
[facility] - Client TELEPHONE ADVICE RECORD [facility] Patient Name: Christine Griffith DOB: 06/23/1965 Initial Comment Caller states she is having movement in her stomach, is not having any pain currently. Is concerned about a tape worm. Nurse Assessment Nurse: Joylene DraftMills-Hernandez, RN, Harriett SineNancy Date/Time (Eastern Time): 12/20/2016 5:49:13 PM Confirm and document reason for call. If symptomatic, describe symptoms. ---Caller states she is having movement in her stomach, is not having any pain currently. Is concerned about a tape worm. The movement symptoms just started this morning. Does the patient have any new or worsening symptoms? ---Yes Will a triage be completed? ---Yes Related visit to physician within the last 2 weeks? ---No Does the PT have any chronic conditions? (i.e. diabetes, asthma, etc.) ---Yes List chronic conditions. ---GERD, Hyperlipidemia Is the patient pregnant or possibly pregnant? (Ask all females between the ages of 212-55) ---No Is this a behavioral health or substance abuse call? ---No Guidelines Guideline Title Affirmed Question Affirmed Notes Abdominal Pain - Upper Abdominal pain Final Disposition User [facility] Mills-Hernandez, RN, [facility] Disagree/Comply: Comply Call Id: [phone]

## 2016-12-21 NOTE — Telephone Encounter (Signed)
Follow up call made to patient regarding stomach issues. Left message for return call..Marland Kitchen

## 2016-12-22 NOTE — Telephone Encounter (Signed)
Second follow up call made to patient [facility] Did not receive call back from call placed on yesterday. Advised to return call if she needed appointment or advice regarding her issue.

## 2017-02-01 DIAGNOSIS — Z6827 Body mass index (BMI) 27.0-27.9, adult: Secondary | ICD-10-CM | POA: Diagnosis not present

## 2017-02-01 DIAGNOSIS — Z780 Asymptomatic menopausal state: Secondary | ICD-10-CM | POA: Diagnosis not present

## 2017-02-01 DIAGNOSIS — Z01419 Encounter for gynecological examination (general) (routine) without abnormal findings: Secondary | ICD-10-CM | POA: Diagnosis not present

## 2017-06-23 NOTE — Progress Notes (Addendum)
[facility] [address] [place], [place] [address] [phone]  Date:  06/25/2017   Name:  Christine Griffith   DOB:  1966-01-26   MRN:  [id number]  PCP:  Sharlene Dory, DO    Chief Complaint: Pain (c/o aching joints and muscle spasms in back and elbow x 1 month. )   History of Present Illness:  Christine Griffith is a 52 y.o. very pleasant female patient who presents with the following:  [name] here today with joint pains over the last month or so Her sx are somewhat vague, not a very clear historian today She notes that she has been having  muscle spasms for a couple of years- the cause is not clear  She did have low magnesium at some point in the past  Her right elbow, her right hand will be very stiff sometimes.  sometimes it can be difficult to hold objects. This is not always worse in the am.  Can also have some stiffness in her left shoulder She thinks that her mother has OA but is not aware of any family history of RA  Currently she is using topical creams for her joint pains She has tried some celbrex that helped in the past  She will use baclofen when she can- this does help but it makes her sleepy   No fever or chills  Patient Active Problem List   Diagnosis Date Noted  . Low back pain 07/19/2015  . Hyperlipidemia 11/27/2014  . Fatigue 06/15/2014  . Urinary frequency 06/15/2014  . Vitamin D deficiency 09/19/2013  . GERD (gastroesophageal reflux disease) 09/19/2013    Past Medical History:  Diagnosis Date  . Chicken pox   . Environmental allergies   . GERD (gastroesophageal reflux disease)   . Glaucoma (increased eye pressure)    Borderline  . Seasonal allergies   . Vitamin D deficiency     Past Surgical History:  Procedure Laterality Date  . ABDOMINAL HYSTERECTOMY    . BREAST BIOPSY Right    stereo  . CESAREAN SECTION    . WISDOM TOOTH EXTRACTION      Social History    Tobacco Use  . Smoking status: Never Smoker  . Smokeless tobacco: Never Used  Substance Use Topics  . Alcohol use: Yes    Comment: rare  . Drug use: No    Family History  Problem Relation Age of Onset  . Hypertension Mother        Living  . Stroke Mother        multiple  . COPD Mother   . Diabetes Mother   . COPD Father 34       Deceased  . Bone cancer Paternal Grandmother   . Glaucoma Maternal Aunt   . Hypertension Brother        x3  . Heart disease Brother        x1  . Asthma Son   . Allergies Son   . Eczema Son        x1  . Eczema Daughter        x3    Allergies  Allergen Reactions  . Codeine Nausea And Vomiting  . Prednisone Nausea And Vomiting  . Hydrocodone Nausea And Vomiting    Medication list has been reviewed and updated.  Current Outpatient Medications on File Prior to Visit  Medication Sig Dispense Refill  . co-enzyme Q-10 50  MG capsule Take 100 mg by mouth.    . dicyclomine (BENTYL) 10 MG capsule Take 1 capsule (10 mg total) by mouth 3 (three) times daily before meals. (Patient taking differently: Take 10 mg by mouth 3 (three) times daily as needed. ) 90 capsule 1  . DULoxetine (CYMBALTA) 30 MG capsule Take 1 capsule (30 mg total) by mouth daily. 30 capsule 1  . fish oil-omega-3 fatty acids 1000 MG capsule Take 1 g by mouth every other day.     . fluticasone (FLONASE) 50 MCG/ACT nasal spray Place 2 sprays into both nostrils daily. 16 g 6  . loratadine (CLARITIN) 10 MG tablet Take 10 mg by mouth daily as needed.     . lovastatin (MEVACOR) 20 MG tablet Take 1 tablet (20 mg total) by mouth at bedtime. 30 tablet 3  . Multiple Vitamin (MULTIVITAMIN) tablet Take 1 tablet by mouth daily.      Marland Kitchen. omeprazole (PRILOSEC) 20 MG capsule Take 1 capsule (20 mg total) by mouth daily. 30 capsule 3   No current facility-administered medications on file prior to visit.     Review of Systems:  As per HPI- otherwise negative.   Physical Examination: Vitals:    06/25/17 1250  BP: 135/88  Pulse: 84  Temp: 98 F (36.7 C)  SpO2: 100%   Vitals:   06/25/17 1250  Weight: 150 lb 12.8 oz (68.4 kg)  Height: 5\' 2"  (1.575 m)   Body mass index is 27.58 kg/m. Ideal Body Weight: Weight in (lb) to have BMI = 25: 136.4  GEN: WDWN, NAD, Non-toxic, A & O x 3, mild overweight, looks well  HEENT: Atraumatic, Normocephalic. Neck supple. No masses, No LAD. Ears and Nose: No external deformity. CV: RRR, No M/G/R. No JVD. No thrill. No extra heart sounds. PULM: CTA B, no wheezes, crackles, rhonchi. No retractions. No resp. distress. No accessory muscle use. EXTR: No c/c/e NEURO Normal gait.  PSYCH: Normally interactive. Conversant. Not depressed or anxious appearing.  Calm demeanor.  Both hands appear normal- no joint hypertrophy or stiffness Her right elbow has normal ROM, but mild tenderness over the lateral eipcondyle   Assessment and Plan: Morning joint stiffness of right hand - Plan: Comprehensive metabolic panel, Sedimentation rate, C-reactive protein, Rheumatoid factor, DG Hand 2 View Right  Muscle spasm of back - Plan: Comprehensive metabolic panel, baclofen (LIORESAL) 10 MG tablet, celecoxib (CELEBREX) 100 MG capsule  Chronic right-sided thoracic back pain - Plan: celecoxib (CELEBREX) 100 MG capsule  Magnesium deficiency - Plan: Magnesium  Here today with stiffness of her joints- it is not completely clear if this is generalized or just in her right arm, as she also describes muscle spasms that cna occur anywhere in her body Would like to get films of her right hand to look for any suggestion of RA, but she would like to have this done at [place] [facility] for cost savings so will wait for her to have this done Await her labs as well  Assuming all looks ok, she may just have some tennis elbow on the right   Signed Abbe AmsterdamJessica Aleyza Salmi, MD  Received her labs 1/22 Letter to pt  Your labs all look good- no suggestion of an autoimmune process or  rheumatoid arthritis so far, but I will wait for your x-rays as well.  Your magnesium level is normal.  You may have some tendonitis of the right elbow which could be treated by sports medicine- please let me know if you would  like a referral! Await x-rays   Results for orders placed or performed in visit on 06/25/17  Comprehensive metabolic panel  Result Value Ref Range   Sodium 139 135 - 145 mEq/L   Potassium 3.8 3.5 - 5.1 mEq/L   Chloride 103 96 - 112 mEq/L   CO2 31 19 - 32 mEq/L   Glucose, Bld 82 70 - 99 mg/dL   BUN 13 6 - 23 mg/dL   Creatinine, Ser 1.61 0.40 - 1.20 mg/dL   Total Bilirubin 0.5 0.2 - 1.2 mg/dL   Alkaline Phosphatase 77 39 - 117 U/L   AST 15 0 - 37 U/L   ALT 15 0 - 35 U/L   Total Protein 7.1 6.0 - 8.3 g/dL   Albumin 4.2 3.5 - 5.2 g/dL   Calcium 9.0 8.4 - 09.6 mg/dL   GFR 04.54 >09.81 mL/min  Sedimentation rate  Result Value Ref Range   Sed Rate 15 0 - 30 mm/hr  C-reactive protein  Result Value Ref Range   CRP 0.9 0.5 - 20.0 mg/dL  Rheumatoid factor  Result Value Ref Range   Rhuematoid fact SerPl-aCnc <14 <14 IU/mL  Magnesium  Result Value Ref Range   Magnesium 2.2 1.5 - 2.5 mg/dL

## 2017-06-25 ENCOUNTER — Encounter: Payer: Self-pay | Admitting: Family Medicine

## 2017-06-25 ENCOUNTER — Ambulatory Visit: Payer: BLUE CROSS/BLUE SHIELD | Admitting: Family Medicine

## 2017-06-25 VITALS — BP 135/88 | HR 84 | Temp 98.0°F | Ht 62.0 in | Wt 150.8 lb

## 2017-06-25 DIAGNOSIS — M62830 Muscle spasm of back: Secondary | ICD-10-CM

## 2017-06-25 DIAGNOSIS — M546 Pain in thoracic spine: Secondary | ICD-10-CM | POA: Diagnosis not present

## 2017-06-25 DIAGNOSIS — E612 Magnesium deficiency: Secondary | ICD-10-CM

## 2017-06-25 DIAGNOSIS — M25641 Stiffness of right hand, not elsewhere classified: Secondary | ICD-10-CM | POA: Diagnosis not present

## 2017-06-25 DIAGNOSIS — G8929 Other chronic pain: Secondary | ICD-10-CM

## 2017-06-25 LAB — COMPREHENSIVE METABOLIC PANEL
ALBUMIN: 4.2 g/dL (ref 3.5–5.2)
ALK PHOS: 77 U/L (ref 39–117)
ALT: 15 U/L (ref 0–35)
AST: 15 U/L (ref 0–37)
BUN: 13 mg/dL (ref 6–23)
CALCIUM: 9 mg/dL (ref 8.4–10.5)
CHLORIDE: 103 meq/L (ref 96–112)
CO2: 31 mEq/L (ref 19–32)
Creatinine, Ser: 0.92 mg/dL (ref 0.40–1.20)
GFR: 82.91 mL/min (ref >60.00)
Glucose, Bld: 82 mg/dL (ref 70–99)
Potassium: 3.8 mEq/L (ref 3.5–5.1)
SODIUM: 139 meq/L (ref 135–145)
TOTAL PROTEIN: 7.1 g/dL (ref 6.0–8.3)
Total Bilirubin: 0.5 mg/dL (ref 0.2–1.2)

## 2017-06-25 LAB — MAGNESIUM: Magnesium: 2.2 mg/dL (ref 1.5–2.5)

## 2017-06-25 LAB — SEDIMENTATION RATE: SED RATE: 15 mm/h (ref 0–30)

## 2017-06-25 LAB — C-REACTIVE PROTEIN: CRP: 0.9 mg/dL (ref 0.5–20.0)

## 2017-06-25 MED ORDER — BACLOFEN 10 MG PO TABS: 10.0000 mg | ORAL_TABLET | Freq: Three times a day (TID) | ORAL | 2 refills | Status: DC | PRN

## 2017-06-25 MED ORDER — CELECOXIB 100 MG PO CAPS: 100.0000 mg | ORAL_CAPSULE | Freq: Two times a day (BID) | ORAL | 5 refills | Status: DC | PRN

## 2017-06-25 NOTE — Patient Instructions (Signed)
I refilled your baclofen and celebrex today- continue to use these as needed We will get labs for you today I ordered films of your right hand- [facility] on [address] in [place] at your convenience   I will be in touch with your labs asap to look for any clues about the cause of your symptoms

## 2017-06-26 LAB — RHEUMATOID FACTOR

## 2017-07-27 ENCOUNTER — Ambulatory Visit: Payer: BLUE CROSS/BLUE SHIELD | Admitting: Family Medicine

## 2017-07-27 ENCOUNTER — Encounter: Payer: Self-pay | Admitting: Family Medicine

## 2017-07-27 DIAGNOSIS — M545 Low back pain: Secondary | ICD-10-CM | POA: Diagnosis not present

## 2017-07-27 DIAGNOSIS — G8929 Other chronic pain: Secondary | ICD-10-CM

## 2017-07-27 NOTE — Patient Instructions (Signed)
Please try to adjust the way that you do things at work. It seems that the more they do a certain maneuvering to cause these symptoms. You can try the Cymbalta and see how that helps. This is a fairly well-tolerated medication.  You can also alternate heat and ice Please try exercising on a regular basis.  You can try a TENS unit as well

## 2017-07-27 NOTE — Progress Notes (Signed)
Christine Griffith - 52 y.o. female MRN [id number]  Date of birth: Jul 25, 1965  SUBJECTIVE:  Including CC & ROS.  Chief Complaint  Patient presents with  . Back Pain    Christine Griffith is a 52 y.o. female that is presenting with back and pain in different areas. Pain has been ongoing for over a year. Pain is chronic. She has been going to physical therapy, she did see any improvement in her pain. Pain is constant ache. Pain is located in her upper back and between her shoulders. She has been applying heat and taking Celebrex with no improvement. Denies certain movements that trigger the pain. 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 Pain is worse at night. Denies injury or surgeries.  She fell sometime last year. She completed a round of physical therapy and had improvement of her pain after that fall. She does endorse wearing a back brace for the past 8-9 months. Since taking that back brace off she feels like her symptoms have gotten worse.    Review of Systems  Constitutional: Negative for fever.  Respiratory: Negative for cough.   Cardiovascular: Negative for chest pain.  Gastrointestinal: Negative for abdominal pain.  Musculoskeletal: Positive for back pain. Negative for gait problem.  Skin: Negative for color change.  Neurological: Negative for weakness.  Hematological: Negative for adenopathy.  Psychiatric/Behavioral: Negative for agitation.    HISTORY: Past Medical, Surgical, Social, and Family History Reviewed & Updated per EMR.   Pertinent Historical Findings include:  Past Medical History:  Diagnosis Date  . Chicken pox   . Environmental allergies   . GERD (gastroesophageal reflux disease)   . Glaucoma (increased eye pressure)    Borderline  . Seasonal allergies   . Vitamin D deficiency     Past Surgical History:  Procedure Laterality Date  . ABDOMINAL HYSTERECTOMY    . BREAST BIOPSY Right    stereo  . CESAREAN SECTION    . WISDOM TOOTH EXTRACTION        Allergies  Allergen Reactions  . Codeine Nausea And Vomiting  . Prednisone Nausea And Vomiting  . Hydrocodone Nausea And Vomiting    Family History  Problem Relation Age of Onset  . Hypertension Mother        Living  . Stroke Mother        multiple  . COPD Mother   . Diabetes Mother   . COPD Father 27       Deceased  . Bone cancer Paternal Grandmother   . Glaucoma Maternal Aunt   . Hypertension Brother        x3  . Heart disease Brother        x1  . Asthma Son   . Allergies Son   . Eczema Son        x1  . Eczema Daughter        x3     Social History   Socioeconomic History  . Marital status: Legally Separated    Spouse name: Not on file  . Number of children: Not on file  . Years of education: Not on file  . Highest education level: Not on file  Social Needs  . Financial resource strain: Not on file  . Food insecurity - worry: Not on file  . Food insecurity - inability: Not on file  . Transportation needs - medical: Not on file  . Transportation needs - non-medical: Not on file  Occupational History  .  Not on file  Tobacco Use  . Smoking status: Never Smoker  . Smokeless tobacco: Never Used  Substance and Sexual Activity  . Alcohol use: Yes    Comment: rare  . Drug use: No  . Sexual activity: Not on file  Other Topics Concern  . Not on file  Social History Narrative  . Not on file     PHYSICAL EXAM:  VS: BP 136/74 (BP Location: Left Arm, Patient Position: Sitting, Cuff Size: Normal)   Pulse 84   Temp 98.4 F (36.9 C) (Oral)   Ht 5\' 2"  (1.575 m)   Wt 150 lb (68 kg)   SpO2 97%   BMI 27.44 kg/m  Physical Exam Gen: NAD, alert, cooperative with exam, well-appearing ENT: normal lips, normal nasal mucosa,  Eye: normal EOM, normal conjunctiva and lids CV:  no edema, +2 pedal pulses   Resp: no accessory muscle use, non-labored,   Skin: no rashes, no areas of induration  Neuro: normal tone, normal sensation to touch Psych:  normal insight,  alert and oriented MSK:  Back/neck: Normal back flexion and extension. Normal neck range of motion. Tenderness to palpation on the medial border of the left and right scapula with it being worse on the left Normal shoulder range of motion. Normal external rotation. Normal strength to resistance with external and internal rotation of the shoulders. Tenderness to palpation of the lumbar paraspinal muscles. Normal strength resistance with hip flexion. Normal knee flexion and extension strength resistance. Normal gait. Negative straight leg raise bilaterally. Normal internal and external rotation of the hips bilaterally. Some tenderness to palpation of the right greater trochanter. Neurovascularly intact     ASSESSMENT & PLAN:   I spent 25 minutes with this patient, greater than 50% was face-to-face time counseling regarding the below diagnosis.   Low back pain Her pain is likely biomechanical in nature. Her exam is normal. It is likely the result of her wearing a back brace for an extended period time, roughly 8-9 months. Her symptoms seems to have exacerbated after not wearing this brace any further. - Counseled on how to augment her activities at work in order to not exacerbate her symptoms - Counseled on Cymbalta use. She can start this medication but would not necessarily have to take it long-term. Can continue the baclofen and Celebrex. - Counseled on home exercise therapy - If no improvement would consider imaging. Possible she may have a component of fibromyalgia with pain in different areas.

## 2017-07-27 NOTE — Assessment & Plan Note (Signed)
Her pain is likely biomechanical in nature. Her exam is normal. It is likely the result of her wearing a back brace for an extended period time, roughly 8-9 months. Her symptoms seems to have exacerbated after not wearing this brace any further. - Counseled on how to augment her activities at work in order to not exacerbate her symptoms - Counseled on Cymbalta use. She can start this medication but would not necessarily have to take it long-term. Can continue the baclofen and Celebrex. - Counseled on home exercise therapy - If no improvement would consider imaging. Possible she may have a component of fibromyalgia with pain in different areas.

## 2017-08-24 ENCOUNTER — Ambulatory Visit: Payer: BLUE CROSS/BLUE SHIELD | Admitting: Family Medicine

## 2017-09-03 ENCOUNTER — Encounter: Payer: Self-pay | Admitting: Family Medicine

## 2017-09-03 ENCOUNTER — Ambulatory Visit: Payer: BLUE CROSS/BLUE SHIELD | Admitting: Family Medicine

## 2017-09-03 VITALS — BP 110/84 | HR 87 | Temp 98.0°F | Ht 62.0 in | Wt 152.2 lb

## 2017-09-03 DIAGNOSIS — G8929 Other chronic pain: Secondary | ICD-10-CM

## 2017-09-03 DIAGNOSIS — M62830 Muscle spasm of back: Secondary | ICD-10-CM | POA: Diagnosis not present

## 2017-09-03 DIAGNOSIS — M25562 Pain in left knee: Secondary | ICD-10-CM

## 2017-09-03 DIAGNOSIS — M7711 Lateral epicondylitis, right elbow: Secondary | ICD-10-CM

## 2017-09-03 DIAGNOSIS — M545 Low back pain: Secondary | ICD-10-CM | POA: Diagnosis not present

## 2017-09-03 DIAGNOSIS — M792 Neuralgia and neuritis, unspecified: Secondary | ICD-10-CM | POA: Diagnosis not present

## 2017-09-03 DIAGNOSIS — M546 Pain in thoracic spine: Secondary | ICD-10-CM

## 2017-09-03 MED ORDER — DICYCLOMINE HCL 10 MG PO CAPS: 10.0000 mg | ORAL_CAPSULE | Freq: Three times a day (TID) | ORAL | 1 refills | Status: DC

## 2017-09-03 MED ORDER — CELECOXIB 100 MG PO CAPS: 100.0000 mg | ORAL_CAPSULE | Freq: Two times a day (BID) | ORAL | 5 refills | Status: DC | PRN

## 2017-09-03 MED ORDER — BACLOFEN 10 MG PO TABS: 10.0000 mg | ORAL_TABLET | Freq: Three times a day (TID) | ORAL | 2 refills | Status: DC | PRN

## 2017-09-03 MED ORDER — DULOXETINE HCL 30 MG PO CPEP: 30.0000 mg | ORAL_CAPSULE | Freq: Every day | ORAL | 1 refills | Status: DC

## 2017-09-03 NOTE — Progress Notes (Signed)
Pre visit review using our clinic review tool, if applicable. No additional management support is needed unless otherwise documented below in the visit note. 

## 2017-09-03 NOTE — Patient Instructions (Addendum)
Band-It elbow- forearm strap for your forearm.  Ice/cold pack over area for 10-15 min twice daily.  Heat (pad or rice pillow in microwave) over affected area, 10-15 minutes twice daily.   OK to take Tylenol 1000 mg (2 extra strength tabs) or 975 mg (3 regular strength tabs) every 6 hours as needed.  EXERCISES  RANGE OF MOTION (ROM) AND STRETCHING EXERCISES - Low Back Prain Most people with lower back pain will find that their symptoms get worse with excessive bending forward (flexion) or arching at the lower back (extension). The exercises that will help resolve your symptoms will focus on the opposite motion.  If you have pain, numbness or tingling which travels down into your buttocks, leg or foot, the goal of the therapy is for these symptoms to move closer to your back and eventually resolve. Sometimes, these leg symptoms will get better, but your lower back pain may worsen. This is often an indication of progress in your rehabilitation. Be very alert to any changes in your symptoms and the activities in which you participated in the 24 hours prior to the change. Sharing this information with your caregiver will allow him or her to most efficiently treat your condition. These exercises may help you when beginning to rehabilitate your injury. Your symptoms may resolve with or without further involvement from your physician, physical therapist or athletic trainer. While completing these exercises, remember:   Restoring tissue flexibility helps normal motion to return to the joints. This allows healthier, less painful movement and activity.  An effective stretch should be held for at least 30 seconds.  A stretch should never be painful. You should only feel a gentle lengthening or release in the stretched tissue. FLEXION RANGE OF MOTION AND STRETCHING EXERCISES:  STRETCH - Flexion, Single Knee to Chest   Lie on a firm bed or floor with both legs extended in front of you.  Keeping one leg in  contact with the floor, bring your opposite knee to your chest. Hold your leg in place by either grabbing behind your thigh or at your knee.  Pull until you feel a gentle stretch in your low back. Hold 30 seconds.  Slowly release your grasp and repeat the exercise with the opposite side. Repeat 2 times. Complete this exercise 3 times per week.   STRETCH - Flexion, Double Knee to Chest  Lie on a firm bed or floor with both legs extended in front of you.  Keeping one leg in contact with the floor, bring your opposite knee to your chest.  Tense your stomach muscles to support your back and then lift your other knee to your chest. Hold your legs in place by either grabbing behind your thighs or at your knees.  Pull both knees toward your chest until you feel a gentle stretch in your low back. Hold 30 seconds.  Tense your stomach muscles and slowly return one leg at a time to the floor. Repeat 2 times. Complete this exercise 3 times per week.   STRETCH - Low Trunk Rotation  Lie on a firm bed or floor. Keeping your legs in front of you, bend your knees so they are both pointed toward the ceiling and your feet are flat on the floor.  Extend your arms out to the side. This will stabilize your upper body by keeping your shoulders in contact with the floor.  Gently and slowly drop both knees together to one side until you feel a gentle stretch in your  low back. Hold for 30 seconds.  Tense your stomach muscles to support your lower back as you bring your knees back to the starting position. Repeat the exercise to the other side. Repeat 2 times. Complete this exercise at least 3 times per week.   EXTENSION RANGE OF MOTION AND FLEXIBILITY EXERCISES:  STRETCH - Extension, Prone on Elbows   Lie on your stomach on the floor, a bed will be too soft. Place your palms about shoulder width apart and at the height of your head.  Place your elbows under your shoulders. If this is too painful, stack  pillows under your chest.  Allow your body to relax so that your hips drop lower and make contact more completely with the floor.  Hold this position for 30 seconds.  Slowly return to lying flat on the floor. Repeat 2 times. Complete this exercise 3 times per week.   RANGE OF MOTION - Extension, Prone Press Ups  Lie on your stomach on the floor, a bed will be too soft. Place your palms about shoulder width apart and at the height of your head.  Keeping your back as relaxed as possible, slowly straighten your elbows while keeping your hips on the floor. You may adjust the placement of your hands to maximize your comfort. As you gain motion, your hands will come more underneath your shoulders.  Hold this position 30 seconds.  Slowly return to lying flat on the floor. Repeat 2 times. Complete this exercise 3 times per week.   RANGE OF MOTION- Quadruped, Neutral Spine   Assume a hands and knees position on a firm surface. Keep your hands under your shoulders and your knees under your hips. You may place padding under your knees for comfort.  Drop your head and point your tailbone toward the ground below you. This will round out your lower back like an angry cat. Hold this position for 30 seconds.  Slowly lift your head and release your tail bone so that your back sags into a large arch, like an old horse.  Hold this position for 30 seconds.  Repeat this until you feel limber in your low back.  Now, find your "sweet spot." This will be the most comfortable position somewhere between the two previous positions. This is your neutral spine. Once you have found this position, tense your stomach muscles to support your low back.  Hold this position for 30 seconds. Repeat 2 times. Complete this exercise 3 times per week.   STRENGTHENING EXERCISES - Low Back Sprain These exercises may help you when beginning to rehabilitate your injury. These exercises should be done near your "sweet spot."  This is the neutral, low-back arch, somewhere between fully rounded and fully arched, that is your least painful position. When performed in this safe range of motion, these exercises can be used for people who have either a flexion or extension based injury. These exercises may resolve your symptoms with or without further involvement from your physician, physical therapist or athletic trainer. While completing these exercises, remember:   Muscles can gain both the endurance and the strength needed for everyday activities through controlled exercises.  Complete these exercises as instructed by your physician, physical therapist or athletic trainer. Increase the resistance and repetitions only as guided.  You may experience muscle soreness or fatigue, but the pain or discomfort you are trying to eliminate should never worsen during these exercises. If this pain does worsen, stop and make certain you are following  the directions exactly. If the pain is still present after adjustments, discontinue the exercise until you can discuss the trouble with your caregiver.  STRENGTHENING - Deep Abdominals, Pelvic Tilt   Lie on a firm bed or floor. Keeping your legs in front of you, bend your knees so they are both pointed toward the ceiling and your feet are flat on the floor.  Tense your lower abdominal muscles to press your low back into the floor. This motion will rotate your pelvis so that your tail bone is scooping upwards rather than pointing at your feet or into the floor. With a gentle tension and even breathing, hold this position for 3 seconds. Repeat 2 times. Complete this exercise 3 times per week.   STRENGTHENING - Abdominals, Crunches   Lie on a firm bed or floor. Keeping your legs in front of you, bend your knees so they are both pointed toward the ceiling and your feet are flat on the floor. Cross your arms over your chest.  Slightly tip your chin down without bending your neck.  Tense  your abdominals and slowly lift your trunk high enough to just clear your shoulder blades. Lifting higher can put excessive stress on the lower back and does not further strengthen your abdominal muscles.  Control your return to the starting position. Repeat 2 times. Complete this exercise 3 times per week.   STRENGTHENING - Quadruped, Opposite UE/LE Lift   Assume a hands and knees position on a firm surface. Keep your hands under your shoulders and your knees under your hips. You may place padding under your knees for comfort.  Find your neutral spine and gently tense your abdominal muscles so that you can maintain this position. Your shoulders and hips should form a rectangle that is parallel with the floor and is not twisted.  Keeping your trunk steady, lift your right hand no higher than your shoulder and then your left leg no higher than your hip. Make sure you are not holding your breath. Hold this position for 30 seconds.  Continuing to keep your abdominal muscles tense and your back steady, slowly return to your starting position. Repeat with the opposite arm and leg. Repeat 2 times. Complete this exercise 3 times per week.   STRENGTHENING - Abdominals and Quadriceps, Straight Leg Raise   Lie on a firm bed or floor with both legs extended in front of you.  Keeping one leg in contact with the floor, bend the other knee so that your foot can rest flat on the floor.  Find your neutral spine, and tense your abdominal muscles to maintain your spinal position throughout the exercise.  Slowly lift your straight leg off the floor about 6 inches for a count of 3, making sure to not hold your breath.  Still keeping your neutral spine, slowly lower your leg all the way to the floor. Repeat this exercise with each leg 2 times. Complete this exercise 3 times per week.  POSTURE AND BODY MECHANICS CONSIDERATIONS - Low Back Sprain Keeping correct posture when sitting, standing or completing  your activities will reduce the stress put on different body tissues, allowing injured tissues a chance to heal and limiting painful experiences. The following are general guidelines for improved posture.  While reading these guidelines, remember:  The exercises prescribed by your provider will help you have the flexibility and strength to maintain correct postures.  The correct posture provides the best environment for your joints to work. All of your  joints have less wear and tear when properly supported by a spine with good posture. This means you will experience a healthier, less painful body.  Correct posture must be practiced with all of your activities, especially prolonged sitting and standing. Correct posture is as important when doing repetitive low-stress activities (typing) as it is when doing a single heavy-load activity (lifting).  RESTING POSITIONS Consider which positions are most painful for you when choosing a resting position. If you have pain with flexion-based activities (sitting, bending, stooping, squatting), choose a position that allows you to rest in a less flexed posture. You would want to avoid curling into a fetal position on your side. If your pain worsens with extension-based activities (prolonged standing, working overhead), avoid resting in an extended position such as sleeping on your stomach. Most people will find more comfort when they rest with their spine in a more neutral position, neither too rounded nor too arched. Lying on a non-sagging bed on your side with a pillow between your knees, or on your back with a pillow under your knees will often provide some relief. Keep in mind, being in any one position for a prolonged period of time, no matter how correct your posture, can still lead to stiffness.  PROPER SITTING POSTURE In order to minimize stress and discomfort on your spine, you must sit with correct posture. Sitting with good posture should be effortless for  a healthy body. Returning to good posture is a gradual process. Many people can work toward this most comfortably by using various supports until they have the flexibility and strength to maintain this posture on their own. When sitting with proper posture, your ears will fall over your shoulders and your shoulders will fall over your hips. You should use the back of the chair to support your upper back. Your lower back will be in a neutral position, just slightly arched. You may place a small pillow or folded towel at the base of your lower back for  support.  When working at a desk, create an environment that supports good, upright posture. Without extra support, muscles tire, which leads to excessive strain on joints and other tissues. Keep these recommendations in mind:  CHAIR:  A chair should be able to slide under your desk when your back makes contact with the back of the chair. This allows you to work closely.  The chair's height should allow your eyes to be level with the upper part of your monitor and your hands to be slightly lower than your elbows.  BODY POSITION  Your feet should make contact with the floor. If this is not possible, use a foot rest.  Keep your ears over your shoulders. This will reduce stress on your neck and low back.  INCORRECT SITTING POSTURES  If you are feeling tired and unable to assume a healthy sitting posture, do not slouch or slump. This puts excessive strain on your back tissues, causing more damage and pain. Healthier options include:  Using more support, like a lumbar pillow.  Switching tasks to something that requires you to be upright or walking.  Talking a brief walk.  Lying down to rest in a neutral-spine position.  PROLONGED STANDING WHILE SLIGHTLY LEANING FORWARD  When completing a task that requires you to lean forward while standing in one place for a long time, place either foot up on a stationary 2-4 inch high object to help maintain  the best posture. When both feet are on the  ground, the lower back tends to lose its slight inward curve. If this curve flattens (or becomes too large), then the back and your other joints will experience too much stress, tire more quickly, and can cause pain.  CORRECT STANDING POSTURES Proper standing posture should be assumed with all daily activities, even if they only take a few moments, like when brushing your teeth. As in sitting, your ears should fall over your shoulders and your shoulders should fall over your hips. You should keep a slight tension in your abdominal muscles to brace your spine. Your tailbone should point down to the ground, not behind your body, resulting in an over-extended swayback posture.   INCORRECT STANDING POSTURES  Common incorrect standing postures include a forward head, locked knees and/or an excessive swayback. WALKING Walk with an upright posture. Your ears, shoulders and hips should all line-up.  PROLONGED ACTIVITY IN A FLEXED POSITION When completing a task that requires you to bend forward at your waist or lean over a low surface, try to find a way to stabilize 3 out of 4 of your limbs. You can place a hand or elbow on your thigh or rest a knee on the surface you are reaching across. This will provide you more stability, so that your muscles do not tire as quickly. By keeping your knees relaxed, or slightly bent, you will also reduce stress across your lower back. CORRECT LIFTING TECHNIQUES  DO :  Assume a wide stance. This will provide you more stability and the opportunity to get as close as possible to the object which you are lifting.  Tense your abdominals to brace your spine. Bend at the knees and hips. Keeping your back locked in a neutral-spine position, lift using your leg muscles. Lift with your legs, keeping your back straight.  Test the weight of unknown objects before attempting to lift them.  Try to keep your elbows locked down at your  sides in order get the best strength from your shoulders when carrying an object.     Always ask for help when lifting heavy or awkward objects. INCORRECT LIFTING TECHNIQUES DO NOT:   Lock your knees when lifting, even if it is a small object.  Bend and twist. Pivot at your feet or move your feet when needing to change directions.  Assume that you can safely pick up even a paperclip without proper posture.   Elbow and Forearm Exercises It is normal to feel mild stretching, pulling, tightness, or discomfort as you do these exercises, but you should stop right away if you feel sudden pain or your pain gets worse. RANGE OF MOTION EXERCISES These exercises warm up your muscles and joints and improve the movement and flexibility of your injured elbow and forearm. These exercises also help to relieve pain, numbness, and tingling.These exercises are done using the muscles in your injured elbow and forearm. Exercise A: Elbow Flexion, Active 1. Hold your left / right arm at your side, and bend your elbow as far as you can using your left / right arm muscles. 2. Hold this position for 30 seconds. 3. Slowly return to the starting position. Repeat 2 times. Complete this exercise 3 times per week. Exercise B: Elbow Extension, Active 1. Hold your left / right arm at your side, and straighten your elbow as much as you can using your left / right arm muscles. 2. Hold this position for 30 seconds. 3. Slowly return to the starting position. Repeat 2 times. Complete this  exercise 3 times per week. Exercise C: Forearm Rotation, Supination, Active 1. Stand or sit with your elbows at your sides. 2. Bend your left / right elbow to an "L" shape (90 degrees). 3. Turn your palm upward until you feel a gentle stretch on the inside of your forearm. 4. Hold this position for 30 seconds. 5. Slowly release and return to the starting position. Repeat 2 times. Complete this exercise 3 times per week. Exercise  D: Forearm Rotation, Pronation, Active 1. Stand or sit with your elbows at your side. 2. Bend your left / right elbow to an "L" shape (90 degrees). 3. Turn your left / right palm downward until you feel a gentle stretch on the top of your forearm. 4. Hold this position for 30 seconds. 5. Slowly release and return to the starting position. Repeat2 times. Complete this exercise 3 times per week. STRETCHING EXERCISES These exercises warm up your muscles and joints and improve the movement and flexibility of your injured elbow and forearm. These exercises also help to relieve pain, numbness, and tingling.These exercises are done using your healthy elbow and forearm to help stretch the muscles in your injured elbow and forearm. Exercise E: Elbow Flexion, Active-Assisted  1. Hold your left / right arm at your side, and bend your elbow as much as you can using your left / right arm muscles. 2. Use your other hand to bend your left / right elbow farther. To do this, gently push up on your forearm until you feel a gentle stretch on the back of your elbow. 3. Hold this position for 30 seconds. 4. Slowly return to the starting position. Repeat 2 times. Complete this exercise 3 times per week. Exercise F: Elbow Extension, Active-Assisted  1. Hold your left / right arm at your side, and straighten your elbow as much as you can using your left / right arm muscles. 2. Use your other hand to straighten the left / right elbow farther. To do this, gently push down on your forearm until you feel a gentle stretch on the inside of your elbow. 3. Hold this position for 30 seconds. 4. Slowly return to the starting position. Repeat 2 times. Complete this exercise 3 times per weeky. Exercise G: Forearm Rotation, Supination, Active-Assisted  1. Sit with your left / right elbow bent in an "L" shape (90 degrees) with your forearm resting on a table. 2. Keeping your upper body and shoulder still, rotate your forearm  so your left / right palm faces upward. 3. Use your other hand to help rotate your forearm further until you feel a gentle to moderate stretch. 4. Hold this position for 30 seconds. 5. Slowly release the stretch and return to the starting position. Repeat 2 times. Complete this exercise 3 times per week. Exercise H: Forearm Rotation, Pronation, Active-Assisted  1. Sit with your left / right elbow bent in an "L" shape (90 degrees) with your forearm resting on a table. 2. Keeping your upper body and shoulder still, rotate your forearm so your palm faces the tabletop. 3. Use your other hand to help rotate your forearm further until you feel a gentle to moderate stretch. 4. Hold this position for 30 seconds. 5. Slowly release the stretch and return to the starting position. Repeat 2 times. Complete this exercise 3 times per week. Exercise I: Elbow Flexion, Supine, Passive 1. Lie on your back. 2. Extend your left / right arm up in the air, bracing it with your other  hand. 3. Let your left / right your hand slowly lower toward your shoulder, while your elbow stays pointed toward the ceiling. You should feel a gentle stretch along the back of your upper arm and elbow. 4. If instructed by your health care provider, you may increase the intensity of your stretch by adding a small wrist weight or hand weight. 5. Hold this position for 3 seconds. 6. Slowly return to the starting position. Repeat 2 times. Complete this exercise 3 times per week. Exercise J: Elbow Extension, Supine, Passive  1. Lie on your back. Make sure that you are in a comfortable position that lets you relax your arm muscles. 2. Place a folded towel under your left / right upper arm so your elbow and shoulder are at the same height. Straighten your left / right arm so your elbow does not rest on the bed or towel. 3. Let the weight of your hand stretch your elbow. Keep your arm and chest muscles relaxed. You should feel a stretch on  the inside of your elbow. 4. If told by your health care provider, you may increase the intensity of your stretch by adding a small wrist weight or hand weight. 5. Hold this position for 30 seconds. 6. Slowly release the stretch. Repeat 2 times. Complete this exercise 3 times per week. STRENGTHENING EXERCISES These exercises build strength and endurance in your elbow and forearm. Endurance is the ability to use your muscles for a long time, even after they get tired. Exercise K: Elbow Flexion, Isometric  1. Stand or sit up straight. 2. Bend your left / right elbow in an "L" shape (90 degrees) and turn your palm up so your forearm is at the height of your waist. 3. Place your other hand on top of your forearm. Gently push down as your left / right arm resists. Push as hard as you can with both arms without causing any pain or movement at your left / right elbow. 4. Hold this position for 3 seconds. 5. Slowly release the tension in both arms. Let your muscles relax completely before repeating. Repeat 2 times. Complete this exercise 3 times per week. Exercise L: Elbow Extensors, Isometric  1. Stand or sit up straight. 2. Place your left / right arm so your palm faces your abdomen and it is at the height of your waist. 3. Place your other hand on the underside of your forearm. Gently push up as your left / right arm resists. Push as hard as you can with both arms, without causing any pain or movement at your left / right elbow. 4. Hold this position for 3 seconds. 5. Slowly release the tension in both arms. Let your muscles relax completely before repeating. Repeat _______2___ times. Complete this exercise 3 times per week. Exercise M: Elbow Flexion With Forearm Palm Up  1. Sit upright on a firm chair without armrests, or stand. 2. Place your left / right arm at your side with your palm facing forward. 3. Holding a 5 lbweight or gripping a rubber exercise band or tubing, bend your elbow to  bring your hand toward your shoulder. 4. Hold this position for 3 seconds. 5. Slowly return to the starting position. Repeat 2 times. Complete this exercise 3 times per week. Exercise N: Elbow Extension  1. Sit on a firm chair without armrests, or stand. 2. Keeping your upper arms at your sides, bring both hands up toward your left / right shoulder while you grip a  rubber exercise band or tubing. Your left / right hand should be just below the other hand. 3. Straighten your left / right elbow. 4. Hold this position for 3 seconds. 5. Control the resistance of the band or tubing as your hand returns to your side. Repeat 2 times. Complete this exercise 3 times per week. Exercise O: Forearm Rotation, Supination  1. Sit with your left / right forearm supported on a table. Keep your elbow at waist height. 2. Rest your hand over the edge of the table with your palm facing down. 3. Gently hold a lightweight hammer. 4. Without moving your elbow, slowly rotate your forearm to turn your palm and hand upward to a "thumbs-up" position. 5. Hold this position for 3 seconds. 6. Slowly return to the starting position. Repeat 2 times. Complete this exercise 3 times per week. Exercise P: Forearm Rotation, Pronation  1. Sit with your left / right forearm supported on a table. Keep your elbow below shoulder height. 2. Rest your hand over the edge of the table with your palm facing up. 3. Gently hold a lightweight hammer. 4. Without moving your elbow, slowly rotate your forearm to turn your palm and hand upward to a "thumbs-up" position. 5. Hold this position for 3 seconds. 6. Slowly return to the starting position. Repeat 2 times. Complete this exercise 3 times per week.  Make sure you discuss any questions you have with your health care provider. Document Released: 04/05/2005 Document Revised: 09/30/2015 Document Reviewed: 02/14/2015 Elsevier Interactive Patient Education  2018 [employer].  Knee  Exercises It is normal to feel mild stretching, pulling, tightness, or discomfort as you do these exercises, but you should stop right away if you feel sudden pain or your pain gets worse. STRETCHING AND RANGE OF MOTION EXERCISES  These exercises warm up your muscles and joints and improve the movement and flexibility of your knee. These exercises also help to relieve pain, numbness, and tingling. Exercise A: Knee Extension, Prone  1. Lie on your abdomen on a bed. 2. Place your left / right knee just beyond the edge of the surface so your knee is not on the bed. You can put a towel under your left / right thigh just above your knee for comfort. 3. Relax your leg muscles and allow gravity to straighten your knee. You should feel a stretch behind your left / right knee. 4. Hold this position for 30 seconds. 5. Scoot up so your knee is supported between repetitions. Repeat 2 times. Complete this stretch 3 times per week. Exercise B: Knee Flexion, Active    1. Lie on your back with both knees straight. If this causes back discomfort, bend your left / right knee so your foot is flat on the floor. 2. Slowly slide your left / right heel back toward your buttocks until you feel a gentle stretch in the front of your knee or thigh. 3. Hold this position for 30 seconds. 4. Slowly slide your left / right heel back to the starting position. Repeat 2 times. Complete this exercise 3 times per week. Exercise C: Quadriceps, Prone    1. Lie on your abdomen on a firm surface, such as a bed or padded floor. 2. Bend your left / right knee and hold your ankle. If you cannot reach your ankle or pant leg, loop a belt around your foot and grab the belt instead. 3. Gently pull your heel toward your buttocks. Your knee should not slide out to  the side. You should feel a stretch in the front of your thigh and knee. 4. Hold this position for 30 seconds. Repeat 2 times. Complete this stretch 3 times per  week. Exercise D: Hamstring, Supine  1. Lie on your back. 2. Loop a belt or towel over the ball of your left / right foot. The ball of your foot is on the walking surface, right under your toes. 3. Straighten your left / right knee and slowly pull on the belt to raise your leg until you feel a gentle stretch behind your knee. ? Do not let your left / right knee bend while you do this. ? Keep your other leg flat on the floor. 4. Hold this position for 30 seconds. Repeat 2 times. Complete this stretch 3 times per week. STRENGTHENING EXERCISES  These exercises build strength and endurance in your knee. Endurance is the ability to use your muscles for a long time, even after they get tired. Exercise E: Quadriceps, Isometric    1. Lie on your back with your left / right leg extended and your other knee bent. Put a rolled towel or small pillow under your knee if told by your health care provider. 2. Slowly tense the muscles in the front of your left / right thigh. You should see your kneecap slide up toward your hip or see increased dimpling just above the knee. This motion will push the back of the knee toward the floor. 3. For 3 seconds, keep the muscle as tight as you can without increasing your pain. 4. Relax the muscles slowly and completely. Repeat for 10 total reps Repeat 2 ti mes. Complete this exercise 3 times per week. Exercise F: Straight Leg Raises - Quadriceps  1. Lie on your back with your left / right leg extended and your other knee bent. 2. Tense the muscles in the front of your left / right thigh. You should see your kneecap slide up or see increased dimpling just above the knee. Your thigh may even shake a bit. 3. Keep these muscles tight as you raise your leg 4-6 inches (10-15 cm) off the floor. Do not let your knee bend. 4. Hold this position for 3 seconds. 5. Keep these muscles tense as you lower your leg. 6. Relax your muscles slowly and completely after each repetition. 10  total reps. Repeat 2 times. Complete this exercise 3 times per week.  Exercise G: Hamstring Curls    If told by your health care provider, do this exercise while wearing ankle weights. Begin with 5 lb weights (optional). Then increase the weight by 1 lb (0.5 kg) increments. Do not wear ankle weights that are more than 20 lbs to start with. 1. Lie on your abdomen with your legs straight. 2. Bend your left / right knee as far as you can without feeling pain. Keep your hips flat against the floor. 3. Hold this position for 3 seconds. 4. Slowly lower your leg to the starting position. Repeat for 10 reps.  Repeat 2 times. Complete this exercise 3 times per week. Exercise H: Squats (Quadriceps)  1. Stand in front of a table, with your feet and knees pointing straight ahead. You may rest your hands on the table for balance but not for support. 2. Slowly bend your knees and lower your hips like you are going to sit in a chair. ? Keep your weight over your heels, not over your toes. ? Keep your lower legs upright so they  are parallel with the table legs. ? Do not let your hips go lower than your knees. ? Do not bend lower than told by your health care provider. ? If your knee pain increases, do not bend as low. 3. Hold the squat position for 1 second. 4. Slowly push with your legs to return to standing. Do not use your hands to pull yourself to standing. Repeat 2 times. Complete this exercise 3 times per week. Exercise I: Wall Slides (Quadriceps)    1. Lean your back against a smooth wall or door while you walk your feet out 18-24 inches (46-61 cm) from it. 2. Place your feet hip-width apart. 3. Slowly slide down the wall or door until your knees Repeat 2 times. Complete this exercise every other day. 4. Exercise K: Straight Leg Raises - Hip Abductors  1. Lie on your side with your left / right leg in the top position. Lie so your head, shoulder, knee, and hip line up. You may bend your bottom  knee to help you keep your balance. 2. Roll your hips slightly forward so your hips are stacked directly over each other and your left / right knee is facing forward. 3. Leading with your heel, lift your top leg 4-6 inches (10-15 cm). You should feel the muscles in your outer hip lifting. ? Do not let your foot drift forward. ? Do not let your knee roll toward the ceiling. 4. Hold this position for 3 seconds. 5. Slowly return your leg to the starting position. 6. Let your muscles relax completely after each repetition. 10 total reps. Repeat 2 times. Complete this exercise 3 times per week. Exercise J: Straight Leg Raises - Hip Extensors  1. Lie on your abdomen on a firm surface. You can put a pillow under your hips if that is more comfortable. 2. Tense the muscles in your buttocks and lift your left / right leg about 4-6 inches (10-15 cm). Keep your knee straight as you lift your leg. 3. Hold this position for 3 seconds. 4. Slowly lower your leg to the starting position. 5. Let your leg relax completely after each repetition. Repeat 2 times. Complete this exercise 3 times per week. Document Released: 04/05/2005 Document Revised: 02/14/2016 Document Reviewed: 03/28/2015 Elsevier Interactive Patient Education  2017 [employer].

## 2017-09-03 NOTE — Progress Notes (Signed)
Musculoskeletal Exam  Patient: Christine Griffith DOB: 1965-06-18  DOS: 09/03/2017  SUBJECTIVE:  Chief Complaint:   Chief Complaint  Patient presents with  . Knee Pain    left  . Back Pain    Right elbow pain    Christine Griffith is a 52 y.o.  female for evaluation and treatment of R elbow pain.   Onset:  3 weeks ago.  No inj or change in activity.  Location: Post elbow Character:  aching and sharp  Progression of issue:  has worsened slightly Associated symptoms: Difficulty lifting Treatment: to date has been rest and prescription NSAIDS.   Neurovascular symptoms: no  Chronic duration of LBP, has been thru PT that was somewhat helpful. R lower back, achy in nature. No red flag s/s's. Has not been compliant w HEP. Cymbalta has been helpful, tolerating well. Has to do lifting at work.   Also has been having L knee pain, 3/10 in severity. Feels popping of knee cap, no catching or locking of jt. No swelling or recent inj or change in activity. Concerned about gout and MS.     ROS: Musculoskeletal/Extremities: +R elbow pain, L knee, low back pain Neuro: No numbness or tingling  Past Medical History:  Diagnosis Date  . Chicken pox   . Environmental allergies   . GERD (gastroesophageal reflux disease)   . Glaucoma (increased eye pressure)    Borderline  . Seasonal allergies   . Vitamin D deficiency    Past Surgical History:  Procedure Laterality Date  . ABDOMINAL HYSTERECTOMY    . BREAST BIOPSY Right    stereo  . CESAREAN SECTION    . WISDOM TOOTH EXTRACTION     Allergies as of 09/03/2017      Reactions   Codeine Nausea And Vomiting   Prednisone Nausea And Vomiting   Hydrocodone Nausea And Vomiting      Medication List        Accurate as of 09/03/17  4:20 PM. Always use your most recent med list.          baclofen 10 MG tablet Commonly known as:  LIORESAL Take 1 tablet (10 mg total) by mouth 3 (three) times daily as needed.   celecoxib 100 MG capsule Commonly known  as:  CELEBREX Take 1 capsule (100 mg total) by mouth 2 (two) times daily as needed.   CLARITIN 10 MG tablet Generic drug:  loratadine Take 10 mg by mouth daily as needed.   co-enzyme Q-10 50 MG capsule Take 100 mg by mouth.   dicyclomine 10 MG capsule Commonly known as:  BENTYL Take 1 capsule (10 mg total) by mouth 3 (three) times daily before meals.   DULoxetine 30 MG capsule Commonly known as:  CYMBALTA Take 1 capsule (30 mg total) by mouth daily.   fish oil-omega-3 fatty acids 1000 MG capsule Take 1 g by mouth every other day.   fluticasone 50 MCG/ACT nasal spray Commonly known as:  FLONASE Place 2 sprays into both nostrils daily.   lovastatin 20 MG tablet Commonly known as:  MEVACOR Take 1 tablet (20 mg total) by mouth at bedtime.   multivitamin tablet Take 1 tablet by mouth daily.        Objective: VITAL SIGNS: BP 110/84 (BP Location: Left Arm, Patient Position: Sitting, Cuff Size: Normal)   Pulse 87   Temp 98 F (36.7 C) (Oral)   Ht 5\' 2"  (1.575 m)   Wt 152 lb 4 oz (69.1 kg)   SpO2  98%   BMI 27.85 kg/m  Constitutional: Well formed, well developed. No acute distress. Cardiovascular: Brisk cap refill Thorax & Lungs: No accessory muscle use Musculoskeletal: R elbow.   Normal active range of motion: yes.   Normal passive range of motion: yes Tenderness to palpation: Yes over lat epicondyle Deformity: no Ecchymosis: no +pain with resisted R wrist extension L knee- no deformity, neg Lachman's, Stine's, varus/valgus, patellar apprehension; gait nml Low back- R thoracic parasp msc ttp, very poor ROM of hamstrings, neg straight.  Neurologic: Normal sensory function. No focal deficits noted. DTR's equal and symmetry in LE's. No clonus. Psychiatric: Normal mood. Age appropriate judgment and insight. Alert & oriented x 3.    Assessment:  Lateral epicondylitis of right elbow  Chronic right-sided low back pain without sciatica  Chronic pain of left  knee   Plan: Orders as above. NSAIDs, Tylenol, heat, ice, stretches/exercises for each area provided. Band-IT strap. Activity as tolerated. Keep an eye on knee for now, I think this is due to patellofemoral syndrome.   Refills provided.  F/u in 1 mo for CPE. The patient voiced understanding and agreement to the plan.   Jilda Rocheicholas Paul The PineryWendling, DO 09/03/17  4:05 PM

## 2017-09-13 ENCOUNTER — Telehealth: Payer: Self-pay | Admitting: Family Medicine

## 2017-09-13 NOTE — Telephone Encounter (Signed)
Yes, please place order for CBC, CMP and lipid panel, I would like her fasting. Dx well adult. TY.

## 2017-09-13 NOTE — Telephone Encounter (Signed)
Copied from CRM [phone]. Topic: Quick Communication - See Telephone Encounter >> Sep 13, 2017  2:13 PM Ninfa MeekerPoole, Bridgett H wrote: CRM for notification. See Telephone encounter for: 09/13/17.  Pt wants to know if she can come in on May 2 to do her labs since her appt for her physical is on 10/12/17 @ 1:30pm? Please advise.

## 2017-09-14 ENCOUNTER — Encounter (HOSPITAL_BASED_OUTPATIENT_CLINIC_OR_DEPARTMENT_OTHER): Payer: Self-pay

## 2017-09-14 ENCOUNTER — Emergency Department (HOSPITAL_BASED_OUTPATIENT_CLINIC_OR_DEPARTMENT_OTHER): Payer: BLUE CROSS/BLUE SHIELD

## 2017-09-14 ENCOUNTER — Other Ambulatory Visit: Payer: Self-pay

## 2017-09-14 ENCOUNTER — Emergency Department (HOSPITAL_BASED_OUTPATIENT_CLINIC_OR_DEPARTMENT_OTHER)
Admission: EM | Admit: 2017-09-14 | Discharge: 2017-09-14 | Disposition: A | Discharge status: Home / Self Care | Payer: BLUE CROSS/BLUE SHIELD | Attending: Emergency Medicine | Admitting: Emergency Medicine

## 2017-09-14 ENCOUNTER — Other Ambulatory Visit: Payer: Self-pay | Admitting: Family Medicine

## 2017-09-14 DIAGNOSIS — G8929 Other chronic pain: Secondary | ICD-10-CM | POA: Diagnosis not present

## 2017-09-14 DIAGNOSIS — Z0000 Encounter for general adult medical examination without abnormal findings: Secondary | ICD-10-CM

## 2017-09-14 DIAGNOSIS — M546 Pain in thoracic spine: Secondary | ICD-10-CM | POA: Diagnosis not present

## 2017-09-14 DIAGNOSIS — R1011 Right upper quadrant pain: Secondary | ICD-10-CM | POA: Diagnosis not present

## 2017-09-14 DIAGNOSIS — Z79899 Other long term (current) drug therapy: Secondary | ICD-10-CM | POA: Insufficient documentation

## 2017-09-14 DIAGNOSIS — R109 Unspecified abdominal pain: Secondary | ICD-10-CM | POA: Diagnosis not present

## 2017-09-14 LAB — URINALYSIS, MICROSCOPIC (REFLEX): WBC UA: NONE SEEN WBC/hpf (ref 0–5)

## 2017-09-14 LAB — COMPREHENSIVE METABOLIC PANEL
ALBUMIN: 3.8 g/dL (ref 3.5–5.0)
ALT: 16 U/L (ref 14–54)
ANION GAP: 8 (ref 5–15)
AST: 24 U/L (ref 15–41)
Alkaline Phosphatase: 76 U/L (ref 38–126)
BUN: 13 mg/dL (ref 6–20)
CHLORIDE: 105 mmol/L (ref 101–111)
CO2: 24 mmol/L (ref 22–32)
Calcium: 8.6 mg/dL — ABNORMAL LOW (ref 8.9–10.3)
Creatinine, Ser: 0.84 mg/dL (ref 0.44–1.00)
GFR calc Af Amer: >60 mL/min (ref >60)
GFR calc non Af Amer: >60 mL/min (ref >60)
GLUCOSE: 86 mg/dL (ref 65–99)
POTASSIUM: 4.2 mmol/L (ref 3.5–5.1)
Sodium: 137 mmol/L (ref 135–145)
Total Bilirubin: 1 mg/dL (ref 0.3–1.2)
Total Protein: 7.4 g/dL (ref 6.5–8.1)

## 2017-09-14 LAB — URINALYSIS, ROUTINE W REFLEX MICROSCOPIC
Bilirubin Urine: NEGATIVE
Glucose, UA: NEGATIVE mg/dL
Ketones, ur: NEGATIVE mg/dL
LEUKOCYTES UA: NEGATIVE
NITRITE: NEGATIVE
PH: 6 (ref 5.0–8.0)
Protein, ur: NEGATIVE mg/dL

## 2017-09-14 LAB — CBC WITH DIFFERENTIAL/PLATELET
BASOS ABS: 0 10*3/uL (ref 0.0–0.1)
BASOS PCT: 1 %
EOS ABS: 0.3 10*3/uL (ref 0.0–0.7)
EOS PCT: 4 %
HCT: 35.5 % — ABNORMAL LOW (ref 36.0–46.0)
Hemoglobin: 11.7 g/dL — ABNORMAL LOW (ref 12.0–15.0)
Lymphocytes Relative: 44 %
Lymphs Abs: 3.2 10*3/uL (ref 0.7–4.0)
MCH: 29.1 pg (ref 26.0–34.0)
MCHC: 33 g/dL (ref 30.0–36.0)
MCV: 88.3 fL (ref 78.0–100.0)
MONO ABS: 0.6 10*3/uL (ref 0.1–1.0)
Monocytes Relative: 9 %
Neutro Abs: 3.1 10*3/uL (ref 1.7–7.7)
Neutrophils Relative %: 42 %
PLATELETS: 362 10*3/uL (ref 150–400)
RBC: 4.02 MIL/uL (ref 3.87–5.11)
RDW: 13.6 % (ref 11.5–15.5)
WBC: 7.3 10*3/uL (ref 4.0–10.5)

## 2017-09-14 LAB — LIPASE, BLOOD: LIPASE: 22 U/L (ref 11–51)

## 2017-09-14 MED ORDER — TRAMADOL HCL 50 MG PO TABS: 50.0000 mg | ORAL_TABLET | Freq: Four times a day (QID) | ORAL | 0 refills | Status: DC | PRN

## 2017-09-14 MED ORDER — IOPAMIDOL (ISOVUE-300) INJECTION 61%
100.0000 mL | Freq: Once | INTRAVENOUS | Status: AC | PRN
  Administered 2017-09-14: 100 mL via INTRAVENOUS

## 2017-09-14 MED ORDER — ONDANSETRON HCL 4 MG PO TABS: 4.0000 mg | ORAL_TABLET | Freq: Three times a day (TID) | ORAL | 0 refills | Status: DC | PRN

## 2017-09-14 NOTE — ED Notes (Signed)
Patient transported to CT 

## 2017-09-14 NOTE — Discharge Instructions (Signed)

## 2017-09-14 NOTE — Telephone Encounter (Signed)
Patient called and informed of PCP instructions. Scheduled lab appointment put in order.

## 2017-09-14 NOTE — ED Notes (Signed)
Upon discharge pt states she is not happy with her care, asked to explain , " they didn't find anything wrong with me, im not satisfied. Ultram is not going to work " PA made aware and into room to talk with pt with nurse at bedside

## 2017-09-14 NOTE — ED Provider Notes (Signed)
[facility] EMERGENCY DEPARTMENT Provider Note   CSN: [id number] Arrival date & time: 09/14/17  1745     History   Chief Complaint Chief Complaint  Patient presents with  . Flank Pain    HPI Christine Griffith is a 52 y.o. female who presents the emergency department with chief complaint of right flank and abdominal pain.  Patient states she has a long history of right back pain with spasms.  She states that over the past 2 days she has had pain that wraps around the front of her abdomen into the upper right quadrant of her abdomen and around her umbilicus.  She has had associated anorexia and nausea without vomiting diarrhea or constipation.  She denies any urinary symptoms and states she has a history of recurrent urinary tract infections and this does not feel the same.  She also states that it does not feel the same as her chronic musculoskeletal back pain.  She had subjective fever and chills yesterday.  She is not taking anything for her pain.  She has been taking small sips of water to remain hydrated.  She has no history of previous surgeries to the abdomen.  HPI  Past Medical History:  Diagnosis Date  . Chicken pox   . Environmental allergies   . GERD (gastroesophageal reflux disease)   . Glaucoma (increased eye pressure)    Borderline  . Seasonal allergies   . Vitamin D deficiency     Patient Active Problem List   Diagnosis Date Noted  . Lateral epicondylitis of right elbow 09/03/2017  . Chronic right-sided low back pain without sciatica 07/19/2015  . Hyperlipidemia 11/27/2014  . Fatigue 06/15/2014  . Urinary frequency 06/15/2014  . Vitamin D deficiency 09/19/2013  . GERD (gastroesophageal reflux disease) 09/19/2013    Past Surgical History:  Procedure Laterality Date  . ABDOMINAL HYSTERECTOMY    . BREAST BIOPSY Right    stereo  . CESAREAN SECTION    . WISDOM TOOTH EXTRACTION       OB History   None      Home Medications    Prior to Admission  medications   Medication Sig Start Date End Date Taking? Authorizing Provider  baclofen (LIORESAL) 10 MG tablet Take 1 tablet (10 mg total) by mouth 3 (three) times daily as needed. 09/03/17   Sharlene DoryWendling, Nicholas Paul, DO  celecoxib (CELEBREX) 100 MG capsule Take 1 capsule (100 mg total) by mouth 2 (two) times daily as needed. 09/03/17   Sharlene DoryWendling, Nicholas Paul, DO  co-enzyme Q-10 50 MG capsule Take 100 mg by mouth.    [provider]  dicyclomine (BENTYL) 10 MG capsule Take 1 capsule (10 mg total) by mouth 3 (three) times daily before meals. 09/03/17   Sharlene DoryWendling, Nicholas Paul, DO  DULoxetine (CYMBALTA) 30 MG capsule Take 1 capsule (30 mg total) by mouth daily. 09/03/17   Sharlene DoryWendling, Nicholas Paul, DO  fish oil-omega-3 fatty acids 1000 MG capsule Take 1 g by mouth every other day.     [provider]  fluticasone (FLONASE) 50 MCG/ACT nasal spray Place 2 sprays into both nostrils daily. 07/16/15   Waldon MerlMartin, William C, PA-C  loratadine (CLARITIN) 10 MG tablet Take 10 mg by mouth daily as needed.     [provider]  lovastatin (MEVACOR) 20 MG tablet Take 1 tablet (20 mg total) by mouth at bedtime. 07/16/15   Waldon MerlMartin, William C, PA-C  Multiple Vitamin (MULTIVITAMIN) tablet Take 1 tablet by mouth daily.  [provider]    Family History Family History  Problem Relation Age of Onset  . Hypertension Mother        Living  . Stroke Mother        multiple  . COPD Mother   . Diabetes Mother   . COPD Father 21       Deceased  . Bone cancer Paternal Grandmother   . Glaucoma Maternal Aunt   . Hypertension Brother        x3  . Heart disease Brother        x1  . Asthma Son   . Allergies Son   . Eczema Son        x1  . Eczema Daughter        x3    Social History Social History   Tobacco Use  . Smoking status: Never Smoker  . Smokeless tobacco: Never Used  Substance Use Topics  . Alcohol use: Yes    Comment: rare  . Drug use: No     Allergies   Codeine;  Prednisone; and Hydrocodone   Review of Systems Review of Systems Ten systems reviewed and are negative for acute change, except as noted in the HPI.    Physical Exam Updated Vital Signs BP (!) 138/92 (BP Location: Right Arm)   Pulse 78   Temp 98.2 F (36.8 C) (Oral)   Resp 18   Ht 5\' 2"  (1.575 m)   Wt 68.9 kg (152 lb)   SpO2 99%   BMI 27.80 kg/m   Physical Exam  Constitutional: She is oriented to person, place, and time. She appears well-developed and well-nourished. No distress.  HENT:  Head: Normocephalic and atraumatic.  Eyes: Conjunctivae are normal. No scleral icterus.  Neck: Normal range of motion.  Cardiovascular: Normal rate, regular rhythm and normal heart sounds. Exam reveals no gallop and no friction rub.  No murmur heard. Pulmonary/Chest: Effort normal and breath sounds normal. No respiratory distress.  Abdominal: Soft. Bowel sounds are normal. She exhibits no distension and no mass. There is no hepatomegaly. There is tenderness (periumbilical) in the periumbilical area. There is no rigidity, no guarding, no CVA tenderness and negative Murphy's sign. No hernia.  Neurological: She is alert and oriented to person, place, and time.  Skin: Skin is warm and dry. She is not diaphoretic.  Psychiatric: Her behavior is normal.  Nursing note and vitals reviewed.    ED Treatments / Results  Labs (all labs ordered are listed, but only abnormal results are displayed) Labs Reviewed  URINALYSIS, ROUTINE W REFLEX MICROSCOPIC - Abnormal; Notable for the following components:      Result Value   Specific Gravity, Urine >1.030 (*)    Hgb urine dipstick TRACE (*)    All other components within normal limits  URINALYSIS, MICROSCOPIC (REFLEX) - Abnormal; Notable for the following components:   Bacteria, UA RARE (*)    Squamous Epithelial / LPF 0-5 (*)    All other components within normal limits  CBC WITH DIFFERENTIAL/PLATELET - Abnormal; Notable for the following  components:   Hemoglobin 11.7 (*)    HCT 35.5 (*)    All other components within normal limits  COMPREHENSIVE METABOLIC PANEL - Abnormal; Notable for the following components:   Calcium 8.6 (*)    All other components within normal limits  LIPASE, BLOOD    EKG None  Radiology No results found.  Procedures Procedures (including critical care time)  Medications Ordered in ED Medications -  No data to display   Initial Impression / Assessment and Plan / ED Course  I have reviewed the triage vital signs and the nursing notes.  Pertinent labs & imaging results that were available during my care of the patient were reviewed by me and considered in my medical decision making (see chart for details).     Patient Labs  And imaging are negative for any significant abnormality. Patient is nontoxic, nonseptic appearing, in no apparent distress.  Patient's pain and other symptoms adequately managed in emergency department.  Fluid bolus given.  Labs, imaging and vitals reviewed.  Patient does not meet the SIRS or Sepsis criteria.  On repeat exam patient does not have a surgical abdomin and there are no peritoneal signs.  No indication of appendicitis, bowel obstruction, bowel perforation, cholecystitis, diverticulitis, PID or ectopic pregnancy.  Patient discharged home with symptomatic treatment and given strict instructions for follow-up with their primary care physician.  I have also discussed reasons to return immediately to the ER.  Patient expresses understanding and agrees with plan.     Final Clinical Impressions(s) / ED Diagnoses   Final diagnoses:  Chronic right-sided thoracic back pain  Right upper quadrant abdominal pain    ED Discharge Orders    None       Arthor Captain, PA-C 09/14/17 2156    Gwyneth Sprout, MD 09/15/17 0010

## 2017-09-14 NOTE — ED Triage Notes (Signed)
Pt c/o right side pain on and off for the last six months, but last night it got more severe with nausea, pt states the pain is worse with movement, denies dysuria, denies hematuria

## 2017-09-14 NOTE — ED Notes (Signed)
ED Provider at bedside. 

## 2017-09-17 ENCOUNTER — Telehealth: Payer: Self-pay | Admitting: Family Medicine

## 2017-09-17 ENCOUNTER — Other Ambulatory Visit: Payer: Self-pay

## 2017-09-17 DIAGNOSIS — M62830 Muscle spasm of back: Secondary | ICD-10-CM

## 2017-09-17 MED ORDER — BACLOFEN 10 MG PO TABS: 10.0000 mg | ORAL_TABLET | Freq: Three times a day (TID) | ORAL | 2 refills | Status: DC | PRN

## 2017-09-17 MED ORDER — RANITIDINE HCL 150 MG PO TABS: 150.0000 mg | ORAL_TABLET | Freq: Two times a day (BID) | ORAL | 3 refills | Status: DC

## 2017-09-17 NOTE — Telephone Encounter (Signed)
Sent in

## 2017-09-17 NOTE — Telephone Encounter (Signed)
Copied from CRM [phone]. Topic: Quick Communication - See Telephone Encounter >> Sep 17, 2017  9:05 AM Oneal GroutSebastian, Jennifer S wrote: CRM for notification. See Telephone encounter for: 09/17/17. Patient is requesting to start back on ranitidine (ZANTAC) 150 MG tablet. Please advise. [facility] [address]

## 2017-09-17 NOTE — Telephone Encounter (Signed)
OK 

## 2017-09-20 NOTE — Telephone Encounter (Signed)
Patient picked up the script however she wanted Dr. Carmelia RollerWendling to know she wanted the capsules and not the tablets for future reference.

## 2017-10-04 ENCOUNTER — Other Ambulatory Visit: Payer: Self-pay | Admitting: Family Medicine

## 2017-10-04 ENCOUNTER — Other Ambulatory Visit (INDEPENDENT_AMBULATORY_CARE_PROVIDER_SITE_OTHER): Payer: BLUE CROSS/BLUE SHIELD

## 2017-10-04 DIAGNOSIS — Z0000 Encounter for general adult medical examination without abnormal findings: Secondary | ICD-10-CM | POA: Diagnosis not present

## 2017-10-04 LAB — LIPID PANEL
Cholesterol: 225 mg/dL — ABNORMAL HIGH (ref 0–200)
HDL: 45.6 mg/dL (ref >39.00)
LDL CALC: 169 mg/dL — AB (ref 0–99)
NonHDL: 179.68
Total CHOL/HDL Ratio: 5
Triglycerides: 55 mg/dL (ref 0.0–149.0)
VLDL: 11 mg/dL (ref 0.0–40.0)

## 2017-10-04 LAB — COMPREHENSIVE METABOLIC PANEL
ALT: 13 U/L (ref 0–35)
AST: 13 U/L (ref 0–37)
Albumin: 3.9 g/dL (ref 3.5–5.2)
Alkaline Phosphatase: 75 U/L (ref 39–117)
BUN: 11 mg/dL (ref 6–23)
CHLORIDE: 107 meq/L (ref 96–112)
CO2: 25 mEq/L (ref 19–32)
CREATININE: 0.84 mg/dL (ref 0.40–1.20)
Calcium: 8.7 mg/dL (ref 8.4–10.5)
GFR: 91.99 mL/min (ref >60.00)
Glucose, Bld: 94 mg/dL (ref 70–99)
Potassium: 3.7 mEq/L (ref 3.5–5.1)
Sodium: 141 mEq/L (ref 135–145)
Total Bilirubin: 0.6 mg/dL (ref 0.2–1.2)
Total Protein: 6.8 g/dL (ref 6.0–8.3)

## 2017-10-04 LAB — CBC
HEMATOCRIT: 36.6 % (ref 36.0–46.0)
Hemoglobin: 12 g/dL (ref 12.0–15.0)
MCHC: 32.8 g/dL (ref 30.0–36.0)
MCV: 88.8 fl (ref 78.0–100.0)
Platelets: 386 10*3/uL (ref 150.0–400.0)
RBC: 4.12 Mil/uL (ref 3.87–5.11)
RDW: 13.6 % (ref 11.5–15.5)
WBC: 6.3 10*3/uL (ref 4.0–10.5)

## 2017-10-04 NOTE — Addendum Note (Signed)
Addended by: Harley Alto on: 10/04/2017 08:10 AM   Modules accepted: Orders

## 2017-10-05 ENCOUNTER — Telehealth: Payer: Self-pay | Admitting: Family Medicine

## 2017-10-05 NOTE — Telephone Encounter (Signed)
I would not worry about those small areas. What they could represent are not worrisome. We can talk further at her appointment also, but tell her to rest easy. TY.

## 2017-10-05 NOTE — Telephone Encounter (Signed)
Copied from CRM [id number]. Topic: Quick Communication - See Telephone Encounter >> Oct 05, 2017  2:32 PM Eston Mould B wrote: CRM for notification. See Telephone encounter for: 10/05/17. PT wants Dr Carmelia Roller to look over her CT report and tell her if there is reason to be concerned about the 4 nodules.  She is asking for a call back on her work number [phone]

## 2017-10-08 NOTE — Telephone Encounter (Signed)
Called left message to call back 

## 2017-10-08 NOTE — Telephone Encounter (Signed)
Called informed the patient of PCP instructions. She did verbalize understanding. 

## 2017-10-09 ENCOUNTER — Telehealth: Payer: Self-pay

## 2017-10-09 NOTE — Telephone Encounter (Signed)
Patient given CT results and verbalized understanding by Scharlene Gloss, CMA.

## 2017-10-09 NOTE — Telephone Encounter (Signed)
Copied from CRM [id number]. Topic: Quick Communication - See Telephone Encounter >> Oct 04, 2017  4:42 PM Floria Raveling A wrote: CRM for notification. See Telephone encounter for: 10/04/17. Pt called in and would like someone to call her with her CT scan results that she had on 4/12  Best number [phone]

## 2017-10-10 DIAGNOSIS — M546 Pain in thoracic spine: Secondary | ICD-10-CM | POA: Diagnosis not present

## 2017-10-10 DIAGNOSIS — M5441 Lumbago with sciatica, right side: Secondary | ICD-10-CM | POA: Diagnosis not present

## 2017-10-12 ENCOUNTER — Encounter: Payer: BLUE CROSS/BLUE SHIELD | Admitting: Family Medicine

## 2017-10-19 ENCOUNTER — Other Ambulatory Visit: Payer: Self-pay | Admitting: Obstetrics and Gynecology

## 2017-10-19 DIAGNOSIS — Z1231 Encounter for screening mammogram for malignant neoplasm of breast: Secondary | ICD-10-CM

## 2017-11-22 DIAGNOSIS — H40003 Preglaucoma, unspecified, bilateral: Secondary | ICD-10-CM | POA: Diagnosis not present

## 2017-11-26 ENCOUNTER — Encounter: Payer: Self-pay | Admitting: Family Medicine

## 2017-11-26 ENCOUNTER — Ambulatory Visit (INDEPENDENT_AMBULATORY_CARE_PROVIDER_SITE_OTHER): Payer: BLUE CROSS/BLUE SHIELD | Admitting: Family Medicine

## 2017-11-26 ENCOUNTER — Telehealth: Payer: Self-pay | Admitting: Family Medicine

## 2017-11-26 VITALS — BP 110/68 | HR 86 | Temp 98.6°F | Ht 62.0 in | Wt 147.4 lb

## 2017-11-26 DIAGNOSIS — J019 Acute sinusitis, unspecified: Secondary | ICD-10-CM | POA: Diagnosis not present

## 2017-11-26 DIAGNOSIS — B9689 Other specified bacterial agents as the cause of diseases classified elsewhere: Secondary | ICD-10-CM

## 2017-11-26 DIAGNOSIS — J302 Other seasonal allergic rhinitis: Secondary | ICD-10-CM

## 2017-11-26 DIAGNOSIS — Z0000 Encounter for general adult medical examination without abnormal findings: Secondary | ICD-10-CM | POA: Diagnosis not present

## 2017-11-26 MED ORDER — FLUTICASONE PROPIONATE 50 MCG/ACT NA SUSP: 2.0000 | Freq: Every day | NASAL | 6 refills | Status: DC

## 2017-11-26 NOTE — Progress Notes (Signed)
Pre visit review using our clinic review tool, if applicable. No additional management support is needed unless otherwise documented below in the visit note. 

## 2017-11-26 NOTE — Patient Instructions (Addendum)
Call your pharmacy to see about the availability of the new shingles vaccine (Shingrix).  Take the Cymbalta and Lovastatin on a daily basis.   Keep up the good work.   Call your pharmacy to see about the availability of the new shingles vaccine (Shingrix).  Flonase (fluticasone); nasal spray that is over the counter. 2 sprays each nostril, once daily. Aim towards the same side eye when you spray.  EXERCISES  RANGE OF MOTION (ROM) AND STRETCHING EXERCISES - Low Back Pain Most people with lower back pain will find that their symptoms get worse with excessive bending forward (flexion) or arching at the lower back (extension). The exercises that will help resolve your symptoms will focus on the opposite motion.  If you have pain, numbness or tingling which travels down into your buttocks, leg or foot, the goal of the therapy is for these symptoms to move closer to your back and eventually resolve. Sometimes, these leg symptoms will get better, but your lower back pain may worsen. This is often an indication of progress in your rehabilitation. Be very alert to any changes in your symptoms and the activities in which you participated in the 24 hours prior to the change. Sharing this information with your caregiver will allow him or her to most efficiently treat your condition. These exercises may help you when beginning to rehabilitate your injury. Your symptoms may resolve with or without further involvement from your physician, physical therapist or athletic trainer. While completing these exercises, remember:   Restoring tissue flexibility helps normal motion to return to the joints. This allows healthier, less painful movement and activity.  An effective stretch should be held for at least 30 seconds.  A stretch should never be painful. You should only feel a gentle lengthening or release in the stretched tissue. FLEXION RANGE OF MOTION AND STRETCHING EXERCISES:  STRETCH - Flexion, Single Knee  to Chest   Lie on a firm bed or floor with both legs extended in front of you.  Keeping one leg in contact with the floor, bring your opposite knee to your chest. Hold your leg in place by either grabbing behind your thigh or at your knee.  Pull until you feel a gentle stretch in your low back. Hold 30 seconds.  Slowly release your grasp and repeat the exercise with the opposite side. Repeat 2 times. Complete this exercise 3 times per week.   STRETCH - Flexion, Double Knee to Chest  Lie on a firm bed or floor with both legs extended in front of you.  Keeping one leg in contact with the floor, bring your opposite knee to your chest.  Tense your stomach muscles to support your back and then lift your other knee to your chest. Hold your legs in place by either grabbing behind your thighs or at your knees.  Pull both knees toward your chest until you feel a gentle stretch in your low back. Hold 30 seconds.  Tense your stomach muscles and slowly return one leg at a time to the floor. Repeat 2 times. Complete this exercise 3 times per week.   STRETCH - Low Trunk Rotation  Lie on a firm bed or floor. Keeping your legs in front of you, bend your knees so they are both pointed toward the ceiling and your feet are flat on the floor.  Extend your arms out to the side. This will stabilize your upper body by keeping your shoulders in contact with the floor.  Gently and  slowly drop both knees together to one side until you feel a gentle stretch in your low back. Hold for 30 seconds.  Tense your stomach muscles to support your lower back as you bring your knees back to the starting position. Repeat the exercise to the other side. Repeat 2 times. Complete this exercise at least 3 times per week.   EXTENSION RANGE OF MOTION AND FLEXIBILITY EXERCISES:  STRETCH - Extension, Prone on Elbows   Lie on your stomach on the floor, a bed will be too soft. Place your palms about shoulder width apart and  at the height of your head.  Place your elbows under your shoulders. If this is too painful, stack pillows under your chest.  Allow your body to relax so that your hips drop lower and make contact more completely with the floor.  Hold this position for 30 seconds.  Slowly return to lying flat on the floor. Repeat 2 times. Complete this exercise 3 times per week.   RANGE OF MOTION - Extension, Prone Press Ups  Lie on your stomach on the floor, a bed will be too soft. Place your palms about shoulder width apart and at the height of your head.  Keeping your back as relaxed as possible, slowly straighten your elbows while keeping your hips on the floor. You may adjust the placement of your hands to maximize your comfort. As you gain motion, your hands will come more underneath your shoulders.  Hold this position 30 seconds.  Slowly return to lying flat on the floor. Repeat 2 times. Complete this exercise 3 times per week.   RANGE OF MOTION- Quadruped, Neutral Spine   Assume a hands and knees position on a firm surface. Keep your hands under your shoulders and your knees under your hips. You may place padding under your knees for comfort.  Drop your head and point your tailbone toward the ground below you. This will round out your lower back like an angry cat. Hold this position for 30 seconds.  Slowly lift your head and release your tail bone so that your back sags into a large arch, like an old horse.  Hold this position for 30 seconds.  Repeat this until you feel limber in your low back.  Now, find your "sweet spot." This will be the most comfortable position somewhere between the two previous positions. This is your neutral spine. Once you have found this position, tense your stomach muscles to support your low back.  Hold this position for 30 seconds. Repeat 2 times. Complete this exercise 3 times per week.   STRENGTHENING EXERCISES - Low Back Sprain These exercises may help  you when beginning to rehabilitate your injury. These exercises should be done near your "sweet spot." This is the neutral, low-back arch, somewhere between fully rounded and fully arched, that is your least painful position. When performed in this safe range of motion, these exercises can be used for people who have either a flexion or extension based injury. These exercises may resolve your symptoms with or without further involvement from your physician, physical therapist or athletic trainer. While completing these exercises, remember:   Muscles can gain both the endurance and the strength needed for everyday activities through controlled exercises.  Complete these exercises as instructed by your physician, physical therapist or athletic trainer. Increase the resistance and repetitions only as guided.  You may experience muscle soreness or fatigue, but the pain or discomfort you are trying to eliminate should never  worsen during these exercises. If this pain does worsen, stop and make certain you are following the directions exactly. If the pain is still present after adjustments, discontinue the exercise until you can discuss the trouble with your caregiver.  STRENGTHENING - Deep Abdominals, Pelvic Tilt   Lie on a firm bed or floor. Keeping your legs in front of you, bend your knees so they are both pointed toward the ceiling and your feet are flat on the floor.  Tense your lower abdominal muscles to press your low back into the floor. This motion will rotate your pelvis so that your tail bone is scooping upwards rather than pointing at your feet or into the floor. With a gentle tension and even breathing, hold this position for 3 seconds. Repeat 2 times. Complete this exercise 3 times per week.   STRENGTHENING - Abdominals, Crunches   Lie on a firm bed or floor. Keeping your legs in front of you, bend your knees so they are both pointed toward the ceiling and your feet are flat on the floor.  Cross your arms over your chest.  Slightly tip your chin down without bending your neck.  Tense your abdominals and slowly lift your trunk high enough to just clear your shoulder blades. Lifting higher can put excessive stress on the lower back and does not further strengthen your abdominal muscles.  Control your return to the starting position. Repeat 2 times. Complete this exercise 3 times per week.   STRENGTHENING - Quadruped, Opposite UE/LE Lift   Assume a hands and knees position on a firm surface. Keep your hands under your shoulders and your knees under your hips. You may place padding under your knees for comfort.  Find your neutral spine and gently tense your abdominal muscles so that you can maintain this position. Your shoulders and hips should form a rectangle that is parallel with the floor and is not twisted.  Keeping your trunk steady, lift your right hand no higher than your shoulder and then your left leg no higher than your hip. Make sure you are not holding your breath. Hold this position for 30 seconds.  Continuing to keep your abdominal muscles tense and your back steady, slowly return to your starting position. Repeat with the opposite arm and leg. Repeat 2 times. Complete this exercise 3 times per week.   STRENGTHENING - Abdominals and Quadriceps, Straight Leg Raise   Lie on a firm bed or floor with both legs extended in front of you.  Keeping one leg in contact with the floor, bend the other knee so that your foot can rest flat on the floor.  Find your neutral spine, and tense your abdominal muscles to maintain your spinal position throughout the exercise.  Slowly lift your straight leg off the floor about 6 inches for a count of 3, making sure to not hold your breath.  Still keeping your neutral spine, slowly lower your leg all the way to the floor. Repeat this exercise with each leg 2 times. Complete this exercise 3 times per week.  POSTURE AND BODY  MECHANICS CONSIDERATIONS - Low Back Sprain Keeping correct posture when sitting, standing or completing your activities will reduce the stress put on different body tissues, allowing injured tissues a chance to heal and limiting painful experiences. The following are general guidelines for improved posture.  While reading these guidelines, remember:  The exercises prescribed by your provider will help you have the flexibility and strength to maintain correct postures.  The correct posture provides the best environment for your joints to work. All of your joints have less wear and tear when properly supported by a spine with good posture. This means you will experience a healthier, less painful body.  Correct posture must be practiced with all of your activities, especially prolonged sitting and standing. Correct posture is as important when doing repetitive low-stress activities (typing) as it is when doing a single heavy-load activity (lifting).  RESTING POSITIONS Consider which positions are most painful for you when choosing a resting position. If you have pain with flexion-based activities (sitting, bending, stooping, squatting), choose a position that allows you to rest in a less flexed posture. You would want to avoid curling into a fetal position on your side. If your pain worsens with extension-based activities (prolonged standing, working overhead), avoid resting in an extended position such as sleeping on your stomach. Most people will find more comfort when they rest with their spine in a more neutral position, neither too rounded nor too arched. Lying on a non-sagging bed on your side with a pillow between your knees, or on your back with a pillow under your knees will often provide some relief. Keep in mind, being in any one position for a prolonged period of time, no matter how correct your posture, can still lead to stiffness.  PROPER SITTING POSTURE In order to minimize stress and  discomfort on your spine, you must sit with correct posture. Sitting with good posture should be effortless for a healthy body. Returning to good posture is a gradual process. Many people can work toward this most comfortably by using various supports until they have the flexibility and strength to maintain this posture on their own. When sitting with proper posture, your ears will fall over your shoulders and your shoulders will fall over your hips. You should use the back of the chair to support your upper back. Your lower back will be in a neutral position, just slightly arched. You may place a small pillow or folded towel at the base of your lower back for  support.  When working at a desk, create an environment that supports good, upright posture. Without extra support, muscles tire, which leads to excessive strain on joints and other tissues. Keep these recommendations in mind:  CHAIR:  A chair should be able to slide under your desk when your back makes contact with the back of the chair. This allows you to work closely.  The chair's height should allow your eyes to be level with the upper part of your monitor and your hands to be slightly lower than your elbows.  BODY POSITION  Your feet should make contact with the floor. If this is not possible, use a foot rest.  Keep your ears over your shoulders. This will reduce stress on your neck and low back.  INCORRECT SITTING POSTURES  If you are feeling tired and unable to assume a healthy sitting posture, do not slouch or slump. This puts excessive strain on your back tissues, causing more damage and pain. Healthier options include:  Using more support, like a lumbar pillow.  Switching tasks to something that requires you to be upright or walking.  Talking a brief walk.  Lying down to rest in a neutral-spine position.  PROLONGED STANDING WHILE SLIGHTLY LEANING FORWARD  When completing a task that requires you to lean forward while  standing in one place for a long time, place either foot up on a stationary 2-4  inch high object to help maintain the best posture. When both feet are on the ground, the lower back tends to lose its slight inward curve. If this curve flattens (or becomes too large), then the back and your other joints will experience too much stress, tire more quickly, and can cause pain.  CORRECT STANDING POSTURES Proper standing posture should be assumed with all daily activities, even if they only take a few moments, like when brushing your teeth. As in sitting, your ears should fall over your shoulders and your shoulders should fall over your hips. You should keep a slight tension in your abdominal muscles to brace your spine. Your tailbone should point down to the ground, not behind your body, resulting in an over-extended swayback posture.   INCORRECT STANDING POSTURES  Common incorrect standing postures include a forward head, locked knees and/or an excessive swayback. WALKING Walk with an upright posture. Your ears, shoulders and hips should all line-up.  PROLONGED ACTIVITY IN A FLEXED POSITION When completing a task that requires you to bend forward at your waist or lean over a low surface, try to find a way to stabilize 3 out of 4 of your limbs. You can place a hand or elbow on your thigh or rest a knee on the surface you are reaching across. This will provide you more stability, so that your muscles do not tire as quickly. By keeping your knees relaxed, or slightly bent, you will also reduce stress across your lower back. CORRECT LIFTING TECHNIQUES  DO :  Assume a wide stance. This will provide you more stability and the opportunity to get as close as possible to the object which you are lifting.  Tense your abdominals to brace your spine. Bend at the knees and hips. Keeping your back locked in a neutral-spine position, lift using your leg muscles. Lift with your legs, keeping your back straight.  Test  the weight of unknown objects before attempting to lift them.  Try to keep your elbows locked down at your sides in order get the best strength from your shoulders when carrying an object.     Always ask for help when lifting heavy or awkward objects. INCORRECT LIFTING TECHNIQUES DO NOT:   Lock your knees when lifting, even if it is a small object.  Bend and twist. Pivot at your feet or move your feet when needing to change directions.  Assume that you can safely pick up even a paperclip without proper posture.

## 2017-11-26 NOTE — Telephone Encounter (Signed)
Copied from CRM [id number]. Topic: Quick Communication - Rx Refill/Question >> Nov 26, 2017  4:02 PM Alexander BergeronBarksdale, Harvey B wrote: Medication: clobetasol cream (TEMOVATE) 0.05 % [[id number]][[id number]]  DISCONTINUED   Has the patient contacted their pharmacy? Yes.   (Agent: If no, request that the patient contact the pharmacy for the refill.) (Agent: If yes, when and what did the pharmacy advise?)  Preferred Pharmacy (with phone number or street name): [facility]  Agent: Please be advised that RX refills may take up to 3 business days. We ask that you follow-up with your pharmacy.

## 2017-11-26 NOTE — Progress Notes (Signed)
Chief Complaint  Patient presents with  . Annual Exam     Well Woman Christine Griffith is here for a complete physical.   Her last physical was >1 year ago.  Current diet: in general, a "healthy" diet. Current exercise: physically active. Weight is stable and she denies daytime fatigue. No LMP recorded. Patient has had a hysterectomy..  Seatbelt? Yes  Health Maintenance Pap/HPV- Yes Mammogram- Yes  Colonoscopy 2017 Tetanus- Yes HIV screening- Yes  Past Medical History:  Diagnosis Date  . Chicken pox   . Environmental allergies   . GERD (gastroesophageal reflux disease)   . Glaucoma (increased eye pressure)    Borderline  . Seasonal allergies   . Vitamin D deficiency      Past Surgical History:  Procedure Laterality Date  . ABDOMINAL HYSTERECTOMY  2014  . BREAST BIOPSY Right    stereo  . CESAREAN SECTION    . WISDOM TOOTH EXTRACTION      Medications  Current Outpatient Medications on File Prior to Visit  Medication Sig Dispense Refill  . baclofen (LIORESAL) 10 MG tablet Take 1 tablet (10 mg total) by mouth 3 (three) times daily as needed. 90 tablet 2  . celecoxib (CELEBREX) 100 MG capsule Take 1 capsule (100 mg total) by mouth 2 (two) times daily as needed. 60 capsule 5  . co-enzyme Q-10 50 MG capsule Take 100 mg by mouth.    . dicyclomine (BENTYL) 10 MG capsule Take 1 capsule (10 mg total) by mouth 3 (three) times daily before meals. 90 capsule 1  . DULoxetine (CYMBALTA) 30 MG capsule Take 1 capsule (30 mg total) by mouth daily. 30 capsule 1  . fish oil-omega-3 fatty acids 1000 MG capsule Take 1 g by mouth every other day.     . fluticasone (FLONASE) 50 MCG/ACT nasal spray Place 2 sprays into both nostrils daily. 16 g 6  . loratadine (CLARITIN) 10 MG tablet Take 10 mg by mouth daily as needed.     . lovastatin (MEVACOR) 20 MG tablet Take 1 tablet (20 mg total) by mouth at bedtime. 30 tablet 3  . Multiple Vitamin (MULTIVITAMIN) tablet Take 1 tablet by mouth daily.       . ranitidine (ZANTAC) 150 MG tablet Take 1 tablet (150 mg total) by mouth 2 (two) times daily. 60 tablet 3   Allergies Allergies  Allergen Reactions  . Codeine Nausea And Vomiting  . Prednisone Nausea And Vomiting  . Hydrocodone Nausea And Vomiting   Review of Systems: Constitutional:  no unexpected weight changes Eye:  no recent significant change in vision Ear/Nose/Mouth/Throat:  Ears: +ear fullness on R; no tinnitus or vertigo and no recent change in hearing Nose/Mouth/Throat:  no complaints of nasal congestion, no sore throat Cardiovascular: no chest pain Respiratory:  no cough and no shortness of breath Gastrointestinal:  no abdominal pain, no change in bowel habits GU:  Female: negative for dysuria or pelvic pain Musculoskeletal/Extremities: +chronic back pain; otherwise no pain of the joints Integumentary (Skin/Breast):  no abnormal skin lesions reported Neurologic:  no headaches Endocrine:  denies fatigue Hematologic/Lymphatic:  No areas of easy bleeding  Exam BP 110/68 (BP Location: Left Arm, Patient Position: Sitting, Cuff Size: Normal)   Pulse 86   Temp 98.6 F (37 C) (Oral)   Ht 5\' 2"  (1.575 m)   Wt 147 lb 6 oz (66.8 kg)   SpO2 97%   BMI 26.96 kg/m  General:  well developed, well nourished, in no apparent distress Skin:  no significant moles, warts, or growths Head:  no masses, lesions, or tenderness Eyes:  pupils equal and round, sclera anicteric without injection Ears:  canals without lesions, TMs shiny without retraction, no obvious effusion, no erythema Nose:  nares patent, septum midline, mucosa normal, and no drainage or sinus tenderness Throat/Pharynx:  lips and gingiva without lesion; tongue and uvula midline; non-inflamed pharynx; no exudates or postnasal drainage Neck: neck supple without adenopathy, thyromegaly, or masses Lungs:  clear to auscultation, breath sounds equal bilaterally, no respiratory distress Cardio:  regular rate and rhythm, no  bruits, no LE edema Abdomen:  abdomen soft, nontender; bowel sounds normal; no masses or organomegaly Genital: Defer to GYN Musculoskeletal:  symmetrical muscle groups noted without atrophy or deformity; poor ROM of her hamstrings.  Extremities:  no clubbing, cyanosis, or edema, no deformities, no skin discoloration Neuro:  gait normal; deep tendon reflexes normal and symmetric Psych: well oriented with normal range of affect and appropriate judgment/insight  Assessment and Plan  Well adult exam  Acute bacterial sinusitis  Seasonal allergies - Plan: fluticasone (FLONASE) 50 MCG/ACT nasal spray   Well 52 y.o. female. Counseled on diet and exercise. Needs to do her stretches/exercises for her back.  Labs reviewed. Needs to take statin daily. Has not been compliant. Will recheck at f/u Follow up 6 mo or prn. The patient voiced understanding and agreement to the plan.  Jilda Roche [place], DO 11/26/17 3:11 PM

## 2017-11-27 NOTE — Telephone Encounter (Signed)
Left VM to call back for clarification; not on med profile.

## 2017-12-03 ENCOUNTER — Encounter (INDEPENDENT_AMBULATORY_CARE_PROVIDER_SITE_OTHER): Payer: Self-pay

## 2017-12-03 ENCOUNTER — Ambulatory Visit
Admission: RE | Admit: 2017-12-03 | Discharge: 2017-12-03 | Disposition: A | Discharge status: Home / Self Care | Payer: BLUE CROSS/BLUE SHIELD | Source: Ambulatory Visit | Attending: Obstetrics and Gynecology | Admitting: Obstetrics and Gynecology

## 2017-12-03 DIAGNOSIS — Z1231 Encounter for screening mammogram for malignant neoplasm of breast: Secondary | ICD-10-CM | POA: Diagnosis not present

## 2017-12-05 DIAGNOSIS — Z23 Encounter for immunization: Secondary | ICD-10-CM | POA: Diagnosis not present

## 2017-12-05 DIAGNOSIS — S99921A Unspecified injury of right foot, initial encounter: Secondary | ICD-10-CM | POA: Diagnosis not present

## 2017-12-07 ENCOUNTER — Ambulatory Visit: Payer: BLUE CROSS/BLUE SHIELD | Admitting: Family Medicine

## 2017-12-28 ENCOUNTER — Other Ambulatory Visit: Payer: Self-pay | Admitting: Family Medicine

## 2017-12-28 NOTE — Telephone Encounter (Signed)
I don't remember or see it in her history. Usually I use a prednisone taper. That is pended if she is willing to take it. TY.

## 2017-12-28 NOTE — Telephone Encounter (Signed)
Copied from CRM [id number]. Topic: Quick Communication - See Telephone Encounter >> Dec 28, 2017  4:27 PM Floria RavelingStovall, Shana A wrote: CRM for notification. See Telephone encounter for: 12/28/17. Pt called in and  stated that she had meant to tell Dr Carmelia RollerWendling about her poison ivy and get him to refill her meds.  She does not remember the name of the med and I did not see it on med list.  She is going to ask pharmacy and call back with the name.    Pharmacy - [facility] [address]  Best number [phone]

## 2017-12-31 ENCOUNTER — Telehealth: Payer: Self-pay

## 2017-12-31 NOTE — Telephone Encounter (Signed)
Patient says she needs a cream and not a pill. Please advise.

## 2017-12-31 NOTE — Telephone Encounter (Signed)
Called left message to call back 

## 2017-12-31 NOTE — Telephone Encounter (Signed)
Copied from CRM [id number]. Topic: Quick Communication - See Telephone Encounter >> Dec 28, 2017  4:27 PM Floria RavelingStovall, Shana A wrote: CRM for notification. See Telephone encounter for: 12/28/17. Pt called in and  stated that she had meant to tell Dr Carmelia RollerWendling about her poison ivy and get him to refill her meds.  She does not remember the name of the med and I did not see it on med list.  She is going to ask pharmacy and call back with the name.    Pharmacy - [facility] [address]  Best number [phone]  >> Dec 28, 2017  4:41 PM Floria RavelingStovall, Shana A wrote: Pt called back with name of med   [facility]

## 2017-12-31 NOTE — Telephone Encounter (Signed)
Called cell number left message to call back. Called the home number/no answer/mail box full unable to leave a message. See rx request for PCP instructions regarding this message.

## 2018-01-01 ENCOUNTER — Telehealth: Payer: Self-pay

## 2018-01-01 NOTE — Telephone Encounter (Signed)
No answer, author left detailed VM to call [phone] to make an appointment.

## 2018-01-01 NOTE — Telephone Encounter (Signed)
Author phoned pt. [id number]:apparent poison ivy rash, and suggested pt. Make an appointment to be seen

## 2018-01-02 MED ORDER — TRIAMCINOLONE ACETONIDE 0.1 % EX CREA: 1.0000 "application " | TOPICAL_CREAM | Freq: Two times a day (BID) | CUTANEOUS | 0 refills | Status: DC

## 2018-01-02 NOTE — Telephone Encounter (Signed)
Patient called back regarding this message in rx message/wants cream not pills. rx note sent to PCP to review.

## 2018-01-02 NOTE — Telephone Encounter (Signed)
Sent in

## 2018-02-14 DIAGNOSIS — Z124 Encounter for screening for malignant neoplasm of cervix: Secondary | ICD-10-CM | POA: Diagnosis not present

## 2018-02-14 DIAGNOSIS — Z01419 Encounter for gynecological examination (general) (routine) without abnormal findings: Secondary | ICD-10-CM | POA: Diagnosis not present

## 2018-02-18 ENCOUNTER — Other Ambulatory Visit: Payer: Self-pay | Admitting: Family Medicine

## 2018-02-18 MED ORDER — RANITIDINE HCL 150 MG PO CAPS: 150.0000 mg | ORAL_CAPSULE | Freq: Two times a day (BID) | ORAL | 3 refills | Status: DC

## 2018-03-11 ENCOUNTER — Encounter: Payer: Self-pay | Admitting: Family Medicine

## 2018-03-11 ENCOUNTER — Ambulatory Visit: Payer: BLUE CROSS/BLUE SHIELD | Admitting: Family Medicine

## 2018-03-11 VITALS — BP 114/80 | HR 86 | Temp 97.8°F | Ht 62.0 in | Wt 140.2 lb

## 2018-03-11 DIAGNOSIS — K2900 Acute gastritis without bleeding: Secondary | ICD-10-CM

## 2018-03-11 MED ORDER — DICYCLOMINE HCL 20 MG PO TABS: 20.0000 mg | ORAL_TABLET | Freq: Three times a day (TID) | ORAL | 0 refills | Status: DC

## 2018-03-11 MED ORDER — TRIAMCINOLONE ACETONIDE 0.1 % EX CREA: 1.0000 "application " | TOPICAL_CREAM | Freq: Two times a day (BID) | CUTANEOUS | 0 refills | Status: DC

## 2018-03-11 NOTE — Patient Instructions (Addendum)
Continue to push fluids, practice good hand hygiene, and play around with different foods to see what helps.   If you start having fevers, shaking or vomiting of anything that resembles stool, seek immediate care.  Call in 2 days if no improvement.  Let us know if you need anything.

## 2018-03-11 NOTE — Progress Notes (Signed)
Pre visit review using our clinic review tool, if applicable. No additional management support is needed unless otherwise documented below in the visit note. 

## 2018-03-11 NOTE — Progress Notes (Signed)
Chief Complaint  Patient presents with  . Abdominal Pain  . Gastroesophageal Reflux  . Constipation     Subjective Christine Griffith is a 52 y.o. female who presents with cramping abdominal pain Symptoms began 1 d ago Patient has abdominal pain, cramping and nausea, constipation Patient denies vomiting, diarrhea, fever and arthralgias Evaluation to date: tried Mag citrate, PPI, Pepto bismol. Sick contacts: none known  Past Medical History:  Diagnosis Date  . Chicken pox   . Environmental allergies   . GERD (gastroesophageal reflux disease)   . Glaucoma (increased eye pressure)    Borderline  . Seasonal allergies   . Vitamin D deficiency    Past Surgical History:  Procedure Laterality Date  . ABDOMINAL HYSTERECTOMY  2014  . BREAST BIOPSY Right    stereo  . CESAREAN SECTION    . WISDOM TOOTH EXTRACTION        Review of Systems Constitutional:  No fevers or chills Gastrointestinal:  As noted in the HPI  Exam BP 114/80 (BP Location: Left Arm, Patient Position: Sitting, Cuff Size: Normal)   Pulse 86   Temp 97.8 F (36.6 C) (Oral)   Ht 5\' 2"  (1.575 m)   Wt 140 lb 4 oz (63.6 kg)   SpO2 96%   BMI 25.65 kg/m  General:  well developed, well hydrated, in no apparent distress Skin:  warm, no pallor or diaphoresis, no rashes Throat/Pharynx:  MMM, no oral lesions noted Lungs:  clear to auscultation, breath sounds equal bilaterally, no respiratory distress, no wheezes Cardio:  regular rate and rhythm without murmurs Abdomen:  abdomen soft, diffusely tender to palpation, nondistended; bowel sounds normal; no masses or organomegaly Extremities:  no clubbing, cyanosis, or edema Psych: Appropriate judgement/insight  Assessment and Plan  Acute gastritis without hemorrhage, unspecified gastritis type - Plan: dicyclomine (BENTYL) 20 MG tablet  Orders as above.  Try to push fluids. Avoid aggravating foods, discussed BRAT diet. F/u if symptoms fail to improve, sooner if  worsening. The patient voiced understanding and agreement to the plan.  Jilda Roche [place], DO 03/11/18  12:22 PM

## 2018-06-07 ENCOUNTER — Ambulatory Visit: Payer: Commercial Managed Care - PPO | Admitting: Family Medicine

## 2018-06-07 ENCOUNTER — Encounter: Payer: Self-pay | Admitting: Family Medicine

## 2018-06-07 VITALS — BP 110/78 | HR 70 | Temp 98.0°F | Ht 62.0 in | Wt 141.5 lb

## 2018-06-07 DIAGNOSIS — G8929 Other chronic pain: Secondary | ICD-10-CM | POA: Diagnosis not present

## 2018-06-07 DIAGNOSIS — E785 Hyperlipidemia, unspecified: Secondary | ICD-10-CM

## 2018-06-07 DIAGNOSIS — M545 Low back pain: Secondary | ICD-10-CM | POA: Diagnosis not present

## 2018-06-07 LAB — LIPID PANEL
CHOLESTEROL: 267 mg/dL — AB (ref 0–200)
HDL: 59 mg/dL (ref >39.00)
LDL Cholesterol: 194 mg/dL — ABNORMAL HIGH (ref 0–99)
NonHDL: 207.98
TRIGLYCERIDES: 71 mg/dL (ref 0.0–149.0)
Total CHOL/HDL Ratio: 5
VLDL: 14.2 mg/dL (ref 0.0–40.0)

## 2018-06-07 MED ORDER — CLOBETASOL PROPIONATE 0.05 % EX CREA: 1.0000 "application " | TOPICAL_CREAM | Freq: Two times a day (BID) | CUTANEOUS | 0 refills | Status: DC

## 2018-06-07 MED ORDER — FAMOTIDINE 20 MG PO TABS: 20.0000 mg | ORAL_TABLET | Freq: Two times a day (BID) | ORAL | 3 refills | Status: DC

## 2018-06-07 NOTE — Progress Notes (Addendum)
Chief Complaint  Patient presents with  . Follow-up    Subjective: Hyperlipidemia Patient presents for Hyperlipidemia follow up. Rx'd lovastatin for arcus senilis.  Has not been taking.  Exercise: some stretching Diet is fair.  The patient is not known to have coexisting coronary artery disease.  +chronic low back pain. Does HEP from PT when she have flares.   ROS: MSK: +tightness over low back  Past Medical History:  Diagnosis Date  . Chicken pox   . Environmental allergies   . GERD (gastroesophageal reflux disease)   . Glaucoma (increased eye pressure)    Borderline  . Seasonal allergies   . Vitamin D deficiency     Objective: BP 110/78 (BP Location: Left Arm, Patient Position: Sitting, Cuff Size: Normal)   Pulse 70   Temp 98 F (36.7 C) (Oral)   Ht 5\' 2"  (1.575 m)   Wt 141 lb 8 oz (64.2 kg)   SpO2 93%   BMI 25.88 kg/m  General: Awake, appears stated age HEENT: MMM, +grey rim around iris b/l Heart: RRR, no LE edema, no bruits Lungs: CTAB, no rales, wheezes or rhonchi. No accessory muscle use MSK: Neg straight leg, hamstrings tight but improved from last exam Neuro: DTR's equal and symmetric, no clonus, no cerebellar signs.  Psych: Age appropriate judgment and insight, normal affect and mood  Assessment and Plan: Hyperlipidemia, unspecified hyperlipidemia type - Plan: Lipid panel  Chronic right-sided low back pain without sciatica  Orders as above. Ck lipids. If we need to go on statin, will call in pravastatin. Do HEP prophylactically 1-2x/week.  F/u in 6 mo or prn. The patient voiced understanding and agreement to the plan.  Jilda Roche [place], DO 06/07/18  11:42 AM

## 2018-06-07 NOTE — Patient Instructions (Addendum)
Give us 2-3 business days to get the results of your labs back.   Keep the diet clean and stay active.  Let us know if you need anything. 

## 2018-06-08 ENCOUNTER — Other Ambulatory Visit: Payer: Self-pay | Admitting: Family Medicine

## 2018-06-08 MED ORDER — ATORVASTATIN CALCIUM 40 MG PO TABS: 40.0000 mg | ORAL_TABLET | Freq: Every day | ORAL | 3 refills | Status: DC

## 2018-06-10 ENCOUNTER — Other Ambulatory Visit: Payer: Self-pay | Admitting: Family Medicine

## 2018-06-10 ENCOUNTER — Other Ambulatory Visit: Payer: Self-pay

## 2018-06-10 DIAGNOSIS — E782 Mixed hyperlipidemia: Secondary | ICD-10-CM

## 2018-06-10 MED ORDER — LOVASTATIN 20 MG PO TABS: 20.0000 mg | ORAL_TABLET | Freq: Every day | ORAL | 3 refills | Status: DC

## 2018-07-19 ENCOUNTER — Other Ambulatory Visit (INDEPENDENT_AMBULATORY_CARE_PROVIDER_SITE_OTHER): Payer: Commercial Managed Care - PPO

## 2018-07-19 ENCOUNTER — Telehealth: Payer: Self-pay | Admitting: Family Medicine

## 2018-07-19 DIAGNOSIS — E782 Mixed hyperlipidemia: Secondary | ICD-10-CM

## 2018-07-19 DIAGNOSIS — M62830 Muscle spasm of back: Secondary | ICD-10-CM

## 2018-07-19 LAB — HEPATIC FUNCTION PANEL
ALBUMIN: 4.3 g/dL (ref 3.5–5.2)
ALK PHOS: 86 U/L (ref 39–117)
ALT: 13 U/L (ref 0–35)
AST: 13 U/L (ref 0–37)
BILIRUBIN DIRECT: 0.1 mg/dL (ref 0.0–0.3)
TOTAL PROTEIN: 7.1 g/dL (ref 6.0–8.3)
Total Bilirubin: 0.6 mg/dL (ref 0.2–1.2)

## 2018-07-19 LAB — LIPID PANEL
Cholesterol: 219 mg/dL — ABNORMAL HIGH (ref 0–200)
HDL: 55.3 mg/dL (ref >39.00)
LDL Cholesterol: 151 mg/dL — ABNORMAL HIGH (ref 0–99)
NONHDL: 163.78
TRIGLYCERIDES: 62 mg/dL (ref 0.0–149.0)
Total CHOL/HDL Ratio: 4
VLDL: 12.4 mg/dL (ref 0.0–40.0)

## 2018-07-19 MED ORDER — BACLOFEN 10 MG PO TABS: 10.0000 mg | ORAL_TABLET | Freq: Three times a day (TID) | ORAL | 2 refills | Status: DC | PRN

## 2018-07-19 NOTE — Telephone Encounter (Signed)
Refill done.  

## 2018-07-19 NOTE — Telephone Encounter (Signed)
Pt came in office stating is needing refill on baclofen (LIORESAL) 10 MG tablet sent to [facility] [address] [place], [place] - [address]. Please advise. Pt tel [phone].

## 2018-07-20 ENCOUNTER — Other Ambulatory Visit: Payer: Self-pay | Admitting: Family Medicine

## 2018-07-22 ENCOUNTER — Telehealth: Payer: Self-pay | Admitting: Family Medicine

## 2018-07-22 ENCOUNTER — Ambulatory Visit: Payer: Commercial Managed Care - PPO | Admitting: Family Medicine

## 2018-07-22 NOTE — Telephone Encounter (Signed)
Left message to call back regarding lab results.

## 2018-07-22 NOTE — Telephone Encounter (Signed)
Copied from CRM [id number]. Topic: General - Other >> Jul 22, 2018 10:14 AM Lynne Logan D wrote: Reason for CRM: Pt called for lab results. NT unavailable. Please advise.

## 2018-07-25 DIAGNOSIS — M7741 Metatarsalgia, right foot: Secondary | ICD-10-CM | POA: Diagnosis not present

## 2018-07-25 DIAGNOSIS — M7742 Metatarsalgia, left foot: Secondary | ICD-10-CM | POA: Diagnosis not present

## 2018-07-25 DIAGNOSIS — L84 Corns and callosities: Secondary | ICD-10-CM | POA: Diagnosis not present

## 2018-12-20 ENCOUNTER — Ambulatory Visit (INDEPENDENT_AMBULATORY_CARE_PROVIDER_SITE_OTHER): Payer: Commercial Managed Care - PPO | Admitting: Family Medicine

## 2018-12-20 ENCOUNTER — Other Ambulatory Visit: Payer: Self-pay | Admitting: Family Medicine

## 2018-12-20 ENCOUNTER — Telehealth: Payer: Self-pay | Admitting: Family Medicine

## 2018-12-20 ENCOUNTER — Other Ambulatory Visit: Payer: Self-pay

## 2018-12-20 ENCOUNTER — Encounter: Payer: Self-pay | Admitting: Family Medicine

## 2018-12-20 VITALS — BP 120/82 | HR 78 | Temp 98.2°F | Ht 62.0 in | Wt 148.5 lb

## 2018-12-20 DIAGNOSIS — M546 Pain in thoracic spine: Secondary | ICD-10-CM | POA: Diagnosis not present

## 2018-12-20 DIAGNOSIS — Z0000 Encounter for general adult medical examination without abnormal findings: Secondary | ICD-10-CM

## 2018-12-20 DIAGNOSIS — M62830 Muscle spasm of back: Secondary | ICD-10-CM | POA: Diagnosis not present

## 2018-12-20 DIAGNOSIS — G8929 Other chronic pain: Secondary | ICD-10-CM | POA: Diagnosis not present

## 2018-12-20 DIAGNOSIS — J302 Other seasonal allergic rhinitis: Secondary | ICD-10-CM

## 2018-12-20 LAB — COMPREHENSIVE METABOLIC PANEL
ALT: 14 U/L (ref 0–35)
AST: 14 U/L (ref 0–37)
Albumin: 4.1 g/dL (ref 3.5–5.2)
Alkaline Phosphatase: 71 U/L (ref 39–117)
BUN: 12 mg/dL (ref 6–23)
CO2: 28 mEq/L (ref 19–32)
Calcium: 8.4 mg/dL (ref 8.4–10.5)
Chloride: 106 mEq/L (ref 96–112)
Creatinine, Ser: 0.78 mg/dL (ref 0.40–1.20)
GFR: 93.83 mL/min (ref >60.00)
Glucose, Bld: 84 mg/dL (ref 70–99)
Potassium: 3.6 mEq/L (ref 3.5–5.1)
Sodium: 139 mEq/L (ref 135–145)
Total Bilirubin: 0.7 mg/dL (ref 0.2–1.2)
Total Protein: 6.8 g/dL (ref 6.0–8.3)

## 2018-12-20 LAB — CBC
HCT: 37 % (ref 36.0–46.0)
Hemoglobin: 12 g/dL (ref 12.0–15.0)
MCHC: 32.4 g/dL (ref 30.0–36.0)
MCV: 89.7 fl (ref 78.0–100.0)
Platelets: 358 10*3/uL (ref 150.0–400.0)
RBC: 4.12 Mil/uL (ref 3.87–5.11)
RDW: 13.7 % (ref 11.5–15.5)
WBC: 6.4 10*3/uL (ref 4.0–10.5)

## 2018-12-20 LAB — LIPID PANEL
Cholesterol: 212 mg/dL — ABNORMAL HIGH (ref 0–200)
HDL: 45.7 mg/dL (ref >39.00)
LDL Cholesterol: 151 mg/dL — ABNORMAL HIGH (ref 0–99)
NonHDL: 166.04
Total CHOL/HDL Ratio: 5
Triglycerides: 77 mg/dL (ref 0.0–149.0)
VLDL: 15.4 mg/dL (ref 0.0–40.0)

## 2018-12-20 LAB — HEMOGLOBIN A1C: Hgb A1c MFr Bld: 5.7 % (ref 4.6–6.5)

## 2018-12-20 MED ORDER — CELECOXIB 100 MG PO CAPS: 100.0000 mg | ORAL_CAPSULE | Freq: Two times a day (BID) | ORAL | 5 refills | Status: DC | PRN

## 2018-12-20 MED ORDER — CLOBETASOL PROPIONATE 0.05 % EX CREA: 1.0000 "application " | TOPICAL_CREAM | Freq: Two times a day (BID) | CUTANEOUS | 0 refills | Status: AC

## 2018-12-20 MED ORDER — LOVASTATIN 20 MG PO TABS: 20.0000 mg | ORAL_TABLET | Freq: Every day | ORAL | 3 refills | Status: DC

## 2018-12-20 MED ORDER — FLUTICASONE PROPIONATE 50 MCG/ACT NA SUSP: 2.0000 | Freq: Every day | NASAL | 6 refills | Status: DC

## 2018-12-20 MED ORDER — LORATADINE 10 MG PO TABS: 10.0000 mg | ORAL_TABLET | Freq: Every day | ORAL | 3 refills | Status: DC | PRN

## 2018-12-20 MED ORDER — BACLOFEN 10 MG PO TABS: 10.0000 mg | ORAL_TABLET | Freq: Three times a day (TID) | ORAL | 5 refills | Status: DC | PRN

## 2018-12-20 NOTE — Progress Notes (Signed)
Chief Complaint  Patient presents with  . Annual Exam     Well Woman Christine Griffith is here for a complete physical.   Her last physical was >1 year ago.  Current diet: in general, diet could be better. Current exercise: some walking. Weight is up a little and she denies daytime fatigue. No LMP recorded. Patient has had a hysterectomy. Seatbelt? Yes  Health Maintenance Mammogram- Yes Tetanus- Yes HIV screening- Yes  Past Medical History:  Diagnosis Date  . Chicken pox   . Environmental allergies   . GERD (gastroesophageal reflux disease)   . Glaucoma (increased eye pressure)    Borderline  . Seasonal allergies   . Vitamin D deficiency      Past Surgical History:  Procedure Laterality Date  . ABDOMINAL HYSTERECTOMY  2014  . BREAST BIOPSY Right    stereo  . CESAREAN SECTION    . WISDOM TOOTH EXTRACTION      Medications  Current Outpatient Medications on File Prior to Visit  Medication Sig Dispense Refill  . baclofen (LIORESAL) 10 MG tablet Take 1 tablet (10 mg total) by mouth 3 (three) times daily as needed. 90 tablet 2  . celecoxib (CELEBREX) 100 MG capsule Take 1 capsule (100 mg total) by mouth 2 (two) times daily as needed. 60 capsule 5  . clobetasol cream (TEMOVATE) 0.05 % Apply 1 application topically 2 (two) times daily. 30 g 0  . co-enzyme Q-10 50 MG capsule Take 100 mg by mouth.    . famotidine (PEPCID) 20 MG tablet Take 1 tablet (20 mg total) by mouth 2 (two) times daily. 180 tablet 3  . fish oil-omega-3 fatty acids 1000 MG capsule Take 1 g by mouth every other day.     . fluticasone (FLONASE) 50 MCG/ACT nasal spray Place 2 sprays into both nostrils daily. 16 g 6  . loratadine (CLARITIN) 10 MG tablet Take 10 mg by mouth daily as needed.     . lovastatin (MEVACOR) 20 MG tablet Take 1 tablet (20 mg total) by mouth at bedtime. 30 tablet 3  . Multiple Vitamin (MULTIVITAMIN) tablet Take 1 tablet by mouth daily.      Marland Kitchen. triamcinolone cream (KENALOG) 0.1 % APPLY  CREAM EXTERNALLY TO AFFECTED ARE(S) TWICE DAILY 30 g 0   Allergies Allergies  Allergen Reactions  . Codeine Nausea And Vomiting  . Prednisone Nausea And Vomiting  . Hydrocodone Nausea And Vomiting    Review of Systems: Constitutional:  no unexpected weight changes Eye:  no recent significant change in vision Ear/Nose/Mouth/Throat:  Ears:  no tinnitus or vertigo and no recent change in hearing Nose/Mouth/Throat:  no complaints of nasal congestion, no sore throat Cardiovascular: no chest pain Respiratory:  no cough and no shortness of breath Gastrointestinal:  no abdominal pain, no change in bowel habits GU:  Female: negative for dysuria or pelvic pain Musculoskeletal/Extremities: +chronic back pain; otherwise no pain of the joints Integumentary (Skin/Breast):  no abnormal skin lesions reported Neurologic:  no headaches Endocrine:  denies fatigue Hematologic/Lymphatic:  No areas of easy bleeding  Exam BP 120/82 (BP Location: Left Arm, Patient Position: Sitting, Cuff Size: Normal)   Pulse 78   Temp 98.2 F (36.8 C) (Oral)   Ht 5\' 2"  (1.575 m)   Wt 148 lb 8 oz (67.4 kg)   SpO2 100%   BMI 27.16 kg/m  General:  well developed, well nourished, in no apparent distress Skin:  no significant moles, warts, or growths Head:  no masses,  lesions, or tenderness Eyes:  pupils equal and round, sclera anicteric without injection Ears:  canals without lesions, TMs shiny without retraction, no obvious effusion, no erythema Nose:  nares patent, septum midline, mucosa normal, and no drainage or sinus tenderness Throat/Pharynx:  lips and gingiva without lesion; tongue and uvula midline; non-inflamed pharynx; no exudates or postnasal drainage Neck: neck supple without adenopathy, thyromegaly, or masses Lungs:  clear to auscultation, breath sounds equal bilaterally, no respiratory distress Cardio:  regular rate and rhythm, no bruits, no LE edema Abdomen:  abdomen soft, nontender; bowel sounds  normal; no masses or organomegaly Genital: Defer to GYN Musculoskeletal:  symmetrical muscle groups noted without atrophy or deformity, poor ROM of hamstrings b.l, neg straight leg Extremities:  no clubbing, cyanosis, or edema, no deformities, no skin discoloration Neuro:  gait normal; deep tendon reflexes normal and symmetric Psych: well oriented with normal range of affect and appropriate judgment/insight  Assessment and Plan  Well adult exam - Plan: CBC, Comprehensive metabolic panel, Lipid panel, Hemoglobin A1c  Muscle spasm of back - Plan: celecoxib (CELEBREX) 100 MG capsule, baclofen (LIORESAL) 10 MG tablet, stretches/exercises given again. Really emphasized importance of stretching as she may not need chiropractor or medicines if that improves.   Chronic right-sided thoracic back pain - Plan: celecoxib (CELEBREX) 100 MG capsule   Well 53 y.o. female. Counseled on diet and exercise. Other orders as above. Follow up in 6 mo. The patient voiced understanding and agreement to the plan.  [place], DO 12/20/18 9:04 AM

## 2018-12-20 NOTE — Telephone Encounter (Signed)
It appears it was already sent in.

## 2018-12-20 NOTE — Patient Instructions (Addendum)
Give [place] 2-3 business days to get the results of your labs back.   Keep the diet clean and stay active.  Flonase (fluticasone); nasal spray that is over the counter. 2 sprays each nostril, once daily. Aim towards the same side eye when you spray. This is available over the counter.   EXERCISES  RANGE OF MOTION (ROM) AND STRETCHING EXERCISES - Low Back Pain Most people with lower back pain will find that their symptoms get worse with excessive bending forward (flexion) or arching at the lower back (extension). The exercises that will help resolve your symptoms will focus on the opposite motion.  If you have pain, numbness or tingling which travels down into your buttocks, leg or foot, the goal of the therapy is for these symptoms to move closer to your back and eventually resolve. Sometimes, these leg symptoms will get better, but your lower back pain may worsen. This is often an indication of progress in your rehabilitation. Be very alert to any changes in your symptoms and the activities in which you participated in the 24 hours prior to the change. Sharing this information with your caregiver will allow him or her to most efficiently treat your condition. These exercises may help you when beginning to rehabilitate your injury. Your symptoms may resolve with or without further involvement from your physician, physical therapist or athletic trainer. While completing these exercises, remember:   Restoring tissue flexibility helps normal motion to return to the joints. This allows healthier, less painful movement and activity.  An effective stretch should be held for at least 30 seconds.  A stretch should never be painful. You should only feel a gentle lengthening or release in the stretched tissue. FLEXION RANGE OF MOTION AND STRETCHING EXERCISES:  STRETCH - Flexion, Single Knee to Chest   Lie on a firm bed or floor with both legs extended in front of you.  Keeping one leg in contact with the  floor, bring your opposite knee to your chest. Hold your leg in place by either grabbing behind your thigh or at your knee.  Pull until you feel a gentle stretch in your low back. Hold 30 seconds.  Slowly release your grasp and repeat the exercise with the opposite side. Repeat 2 times. Complete this exercise 3 times per week.   STRETCH - Flexion, Double Knee to Chest  Lie on a firm bed or floor with both legs extended in front of you.  Keeping one leg in contact with the floor, bring your opposite knee to your chest.  Tense your stomach muscles to support your back and then lift your other knee to your chest. Hold your legs in place by either grabbing behind your thighs or at your knees.  Pull both knees toward your chest until you feel a gentle stretch in your low back. Hold 30 seconds.  Tense your stomach muscles and slowly return one leg at a time to the floor. Repeat 2 times. Complete this exercise 3 times per week.   STRETCH - Low Trunk Rotation  Lie on a firm bed or floor. Keeping your legs in front of you, bend your knees so they are both pointed toward the ceiling and your feet are flat on the floor.  Extend your arms out to the side. This will stabilize your upper body by keeping your shoulders in contact with the floor.  Gently and slowly drop both knees together to one side until you feel a gentle stretch in your low back.  Hold for 30 seconds.  Tense your stomach muscles to support your lower back as you bring your knees back to the starting position. Repeat the exercise to the other side. Repeat 2 times. Complete this exercise at least 3 times per week.   EXTENSION RANGE OF MOTION AND FLEXIBILITY EXERCISES:  STRETCH - Extension, Prone on Elbows   Lie on your stomach on the floor, a bed will be too soft. Place your palms about shoulder width apart and at the height of your head.  Place your elbows under your shoulders. If this is too painful, stack pillows under your  chest.  Allow your body to relax so that your hips drop lower and make contact more completely with the floor.  Hold this position for 30 seconds.  Slowly return to lying flat on the floor. Repeat 2 times. Complete this exercise 3 times per week.   RANGE OF MOTION - Extension, Prone Press Ups  Lie on your stomach on the floor, a bed will be too soft. Place your palms about shoulder width apart and at the height of your head.  Keeping your back as relaxed as possible, slowly straighten your elbows while keeping your hips on the floor. You may adjust the placement of your hands to maximize your comfort. As you gain motion, your hands will come more underneath your shoulders.  Hold this position 30 seconds.  Slowly return to lying flat on the floor. Repeat 2 times. Complete this exercise 3 times per week.   RANGE OF MOTION- Quadruped, Neutral Spine   Assume a hands and knees position on a firm surface. Keep your hands under your shoulders and your knees under your hips. You may place padding under your knees for comfort.  Drop your head and point your tailbone toward the ground below you. This will round out your lower back like an angry cat. Hold this position for 30 seconds.  Slowly lift your head and release your tail bone so that your back sags into a large arch, like an old horse.  Hold this position for 30 seconds.  Repeat this until you feel limber in your low back.  Now, find your "sweet spot." This will be the most comfortable position somewhere between the two previous positions. This is your neutral spine. Once you have found this position, tense your stomach muscles to support your low back.  Hold this position for 30 seconds. Repeat 2 times. Complete this exercise 3 times per week.   STRENGTHENING EXERCISES - Low Back Sprain These exercises may help you when beginning to rehabilitate your injury. These exercises should be done near your "sweet spot." This is the  neutral, low-back arch, somewhere between fully rounded and fully arched, that is your least painful position. When performed in this safe range of motion, these exercises can be used for people who have either a flexion or extension based injury. These exercises may resolve your symptoms with or without further involvement from your physician, physical therapist or athletic trainer. While completing these exercises, remember:   Muscles can gain both the endurance and the strength needed for everyday activities through controlled exercises.  Complete these exercises as instructed by your physician, physical therapist or athletic trainer. Increase the resistance and repetitions only as guided.  You may experience muscle soreness or fatigue, but the pain or discomfort you are trying to eliminate should never worsen during these exercises. If this pain does worsen, stop and make certain you are following the directions  exactly. If the pain is still present after adjustments, discontinue the exercise until you can discuss the trouble with your caregiver.  STRENGTHENING - Deep Abdominals, Pelvic Tilt   Lie on a firm bed or floor. Keeping your legs in front of you, bend your knees so they are both pointed toward the ceiling and your feet are flat on the floor.  Tense your lower abdominal muscles to press your low back into the floor. This motion will rotate your pelvis so that your tail bone is scooping upwards rather than pointing at your feet or into the floor. With a gentle tension and even breathing, hold this position for 3 seconds. Repeat 2 times. Complete this exercise 3 times per week.   STRENGTHENING - Abdominals, Crunches   Lie on a firm bed or floor. Keeping your legs in front of you, bend your knees so they are both pointed toward the ceiling and your feet are flat on the floor. Cross your arms over your chest.  Slightly tip your chin down without bending your neck.  Tense your abdominals  and slowly lift your trunk high enough to just clear your shoulder blades. Lifting higher can put excessive stress on the lower back and does not further strengthen your abdominal muscles.  Control your return to the starting position. Repeat 2 times. Complete this exercise 3 times per week.   STRENGTHENING - Quadruped, Opposite UE/LE Lift   Assume a hands and knees position on a firm surface. Keep your hands under your shoulders and your knees under your hips. You may place padding under your knees for comfort.  Find your neutral spine and gently tense your abdominal muscles so that you can maintain this position. Your shoulders and hips should form a rectangle that is parallel with the floor and is not twisted.  Keeping your trunk steady, lift your right hand no higher than your shoulder and then your left leg no higher than your hip. Make sure you are not holding your breath. Hold this position for 30 seconds.  Continuing to keep your abdominal muscles tense and your back steady, slowly return to your starting position. Repeat with the opposite arm and leg. Repeat 2 times. Complete this exercise 3 times per week.   STRENGTHENING - Abdominals and Quadriceps, Straight Leg Raise   Lie on a firm bed or floor with both legs extended in front of you.  Keeping one leg in contact with the floor, bend the other knee so that your foot can rest flat on the floor.  Find your neutral spine, and tense your abdominal muscles to maintain your spinal position throughout the exercise.  Slowly lift your straight leg off the floor about 6 inches for a count of 3, making sure to not hold your breath.  Still keeping your neutral spine, slowly lower your leg all the way to the floor. Repeat this exercise with each leg 2 times. Complete this exercise 3 times per week.  POSTURE AND BODY MECHANICS CONSIDERATIONS - Low Back Sprain Keeping correct posture when sitting, standing or completing your activities  will reduce the stress put on different body tissues, allowing injured tissues a chance to heal and limiting painful experiences. The following are general guidelines for improved posture.  While reading these guidelines, remember:  The exercises prescribed by your provider will help you have the flexibility and strength to maintain correct postures.  The correct posture provides the best environment for your joints to work. All of your joints have  less wear and tear when properly supported by a spine with good posture. This means you will experience a healthier, less painful body.  Correct posture must be practiced with all of your activities, especially prolonged sitting and standing. Correct posture is as important when doing repetitive low-stress activities (typing) as it is when doing a single heavy-load activity (lifting).  RESTING POSITIONS Consider which positions are most painful for you when choosing a resting position. If you have pain with flexion-based activities (sitting, bending, stooping, squatting), choose a position that allows you to rest in a less flexed posture. You would want to avoid curling into a fetal position on your side. If your pain worsens with extension-based activities (prolonged standing, working overhead), avoid resting in an extended position such as sleeping on your stomach. Most people will find more comfort when they rest with their spine in a more neutral position, neither too rounded nor too arched. Lying on a non-sagging bed on your side with a pillow between your knees, or on your back with a pillow under your knees will often provide some relief. Keep in mind, being in any one position for a prolonged period of time, no matter how correct your posture, can still lead to stiffness.  PROPER SITTING POSTURE In order to minimize stress and discomfort on your spine, you must sit with correct posture. Sitting with good posture should be effortless for a healthy body.  Returning to good posture is a gradual process. Many people can work toward this most comfortably by using various supports until they have the flexibility and strength to maintain this posture on their own. When sitting with proper posture, your ears will fall over your shoulders and your shoulders will fall over your hips. You should use the back of the chair to support your upper back. Your lower back will be in a neutral position, just slightly arched. You may place a small pillow or folded towel at the base of your lower back for  support.  When working at a desk, create an environment that supports good, upright posture. Without extra support, muscles tire, which leads to excessive strain on joints and other tissues. Keep these recommendations in mind:  CHAIR:  A chair should be able to slide under your desk when your back makes contact with the back of the chair. This allows you to work closely.  The chair's height should allow your eyes to be level with the upper part of your monitor and your hands to be slightly lower than your elbows.  BODY POSITION  Your feet should make contact with the floor. If this is not possible, use a foot rest.  Keep your ears over your shoulders. This will reduce stress on your neck and low back.  INCORRECT SITTING POSTURES  If you are feeling tired and unable to assume a healthy sitting posture, do not slouch or slump. This puts excessive strain on your back tissues, causing more damage and pain. Healthier options include:  Using more support, like a lumbar pillow.  Switching tasks to something that requires you to be upright or walking.  Talking a brief walk.  Lying down to rest in a neutral-spine position.  PROLONGED STANDING WHILE SLIGHTLY LEANING FORWARD  When completing a task that requires you to lean forward while standing in one place for a long time, place either foot up on a stationary 2-4 inch high object to help maintain the best posture.  When both feet are on the ground, the  lower back tends to lose its slight inward curve. If this curve flattens (or becomes too large), then the back and your other joints will experience too much stress, tire more quickly, and can cause pain.  CORRECT STANDING POSTURES Proper standing posture should be assumed with all daily activities, even if they only take a few moments, like when brushing your teeth. As in sitting, your ears should fall over your shoulders and your shoulders should fall over your hips. You should keep a slight tension in your abdominal muscles to brace your spine. Your tailbone should point down to the ground, not behind your body, resulting in an over-extended swayback posture.   INCORRECT STANDING POSTURES  Common incorrect standing postures include a forward head, locked knees and/or an excessive swayback. WALKING Walk with an upright posture. Your ears, shoulders and hips should all line-up.  PROLONGED ACTIVITY IN A FLEXED POSITION When completing a task that requires you to bend forward at your waist or lean over a low surface, try to find a way to stabilize 3 out of 4 of your limbs. You can place a hand or elbow on your thigh or rest a knee on the surface you are reaching across. This will provide you more stability, so that your muscles do not tire as quickly. By keeping your knees relaxed, or slightly bent, you will also reduce stress across your lower back. CORRECT LIFTING TECHNIQUES  DO :  Assume a wide stance. This will provide you more stability and the opportunity to get as close as possible to the object which you are lifting.  Tense your abdominals to brace your spine. Bend at the knees and hips. Keeping your back locked in a neutral-spine position, lift using your leg muscles. Lift with your legs, keeping your back straight.  Test the weight of unknown objects before attempting to lift them.  Try to keep your elbows locked down at your sides in order get the  best strength from your shoulders when carrying an object.     Always ask for help when lifting heavy or awkward objects. INCORRECT LIFTING TECHNIQUES DO NOT:   Lock your knees when lifting, even if it is a small object.  Bend and twist. Pivot at your feet or move your feet when needing to change directions.  Assume that you can safely pick up even a paperclip without proper posture.

## 2018-12-24 ENCOUNTER — Telehealth: Payer: Self-pay | Admitting: Family Medicine

## 2018-12-24 ENCOUNTER — Telehealth: Payer: Self-pay | Admitting: *Deleted

## 2018-12-24 ENCOUNTER — Encounter: Payer: Self-pay | Admitting: Family Medicine

## 2018-12-24 MED ORDER — ONE-DAILY MULTI VITAMINS PO TABS: 1.0000 | ORAL_TABLET | Freq: Every day | ORAL | 1 refills | Status: DC

## 2018-12-24 NOTE — Telephone Encounter (Signed)
Patient returned call about her test results- letter has been mailed to patient- relayed PCP notification as written and patient to expect letter as well with lab results and same.

## 2018-12-24 NOTE — Telephone Encounter (Signed)
Sent in MV to pharmacy requested

## 2018-12-24 NOTE — Telephone Encounter (Signed)
Copied from [place] [id number]. Topic: Quick Communication - Rx Refill/Question >> Dec 24, 2018  2:05 PM Leward Quan A wrote: Medication: Multiple Vitamin (MULTIVITAMIN) tablet   Asking for Rx to be sent to pharmacy   Has the patient contacted their pharmacy? Yes.   (Agent: If no, request that the patient contact the pharmacy for the refill.) (Agent: If yes, when and what did the pharmacy advise?)  Preferred Pharmacy (with phone number or street name): [facility] [place] [phone] (Phone) [phone] (Fax)    Agent: Please be advised that RX refills may take up to 3 business days. We ask that you follow-up with your pharmacy.

## 2019-01-17 ENCOUNTER — Other Ambulatory Visit: Payer: Self-pay | Admitting: Family Medicine

## 2019-05-29 ENCOUNTER — Ambulatory Visit: Payer: Commercial Managed Care - PPO | Admitting: Internal Medicine

## 2019-05-29 ENCOUNTER — Other Ambulatory Visit: Payer: Self-pay

## 2019-05-29 VITALS — BP 121/79 | HR 93 | Temp 97.7°F | Resp 18 | Ht 62.0 in | Wt 162.0 lb

## 2019-05-29 DIAGNOSIS — R1031 Right lower quadrant pain: Secondary | ICD-10-CM

## 2019-05-29 LAB — CBC WITH DIFFERENTIAL/PLATELET
Absolute Monocytes: 702 cells/uL (ref 200–950)
Basophils Absolute: 70 cells/uL (ref 0–200)
Basophils Relative: 0.9 %
Eosinophils Absolute: 499 cells/uL (ref 15–500)
Eosinophils Relative: 6.4 %
HCT: 38.1 % (ref 35.0–45.0)
Hemoglobin: 12.4 g/dL (ref 11.7–15.5)
Lymphs Abs: 2855 cells/uL (ref 850–3900)
MCH: 29 pg (ref 27.0–33.0)
MCHC: 32.5 g/dL (ref 32.0–36.0)
MCV: 89.2 fL (ref 80.0–100.0)
MPV: 10.2 fL (ref 7.5–12.5)
Monocytes Relative: 9 %
Neutro Abs: 3674 cells/uL (ref 1500–7800)
Neutrophils Relative %: 47.1 %
Platelets: 407 10*3/uL — ABNORMAL HIGH (ref 140–400)
RBC: 4.27 10*6/uL (ref 3.80–5.10)
RDW: 12.3 % (ref 11.0–15.0)
Total Lymphocyte: 36.6 %
WBC: 7.8 10*3/uL (ref 3.8–10.8)

## 2019-05-29 LAB — POC URINALSYSI DIPSTICK (AUTOMATED)
Bilirubin, UA: NEGATIVE
Blood, UA: NEGATIVE
Glucose, UA: NEGATIVE
Ketones, UA: NEGATIVE
Leukocytes, UA: NEGATIVE
Nitrite, UA: NEGATIVE
Protein, UA: NEGATIVE
Spec Grav, UA: 1.025 (ref 1.010–1.025)
Urobilinogen, UA: 0.2 E.U./dL
pH, UA: 6 (ref 5.0–8.0)

## 2019-05-29 LAB — COMPREHENSIVE METABOLIC PANEL
AG Ratio: 1.4 (calc) (ref 1.0–2.5)
ALT: 20 U/L (ref 6–29)
AST: 16 U/L (ref 10–35)
Albumin: 4.2 g/dL (ref 3.6–5.1)
Alkaline phosphatase (APISO): 81 U/L (ref 37–153)
BUN: 11 mg/dL (ref 7–25)
CO2: 30 mmol/L (ref 20–32)
Calcium: 9.1 mg/dL (ref 8.6–10.4)
Chloride: 104 mmol/L (ref 98–110)
Creat: 0.8 mg/dL (ref 0.50–1.05)
Globulin: 2.9 g/dL (calc) (ref 1.9–3.7)
Glucose, Bld: 89 mg/dL (ref 65–99)
Potassium: 4 mmol/L (ref 3.5–5.3)
Sodium: 139 mmol/L (ref 135–146)
Total Bilirubin: 0.7 mg/dL (ref 0.2–1.2)
Total Protein: 7.1 g/dL (ref 6.1–8.1)

## 2019-05-29 NOTE — Progress Notes (Signed)
Subjective:    Patient ID: Christine Griffith, female    DOB: June 05, 1966, 53 y.o.   MRN: [id number]  DOS:  05/29/2019 Type of visit - description: Acute The patient woke up today at 7 AM and noted right lower quadrant abdominal pain. She had a BM, the pain did not change. The pain does not radiate, it is somewhat worse with certain movements. She is also slightly TTP in that area. History of recurrent UTIs.    Review of Systems No fever chills Appetite is okay but she has not eaten today, she is afraid of exacerbating the pain  Denies nausea, vomiting, diarrhea.  Again she had a normal bowel movement today. No vaginal discharge or bleeding. No dysuria, gross hematuria or difficulty urinating. She has been dealing with back pain lately.  Not a new symptom.  Past Medical History:  Diagnosis Date  . Chicken pox   . Environmental allergies   . GERD (gastroesophageal reflux disease)   . Glaucoma (increased eye pressure)    Borderline  . Seasonal allergies   . Vitamin D deficiency     Past Surgical History:  Procedure Laterality Date  . ABDOMINAL HYSTERECTOMY  2014  . BREAST BIOPSY Right    stereo  . CESAREAN SECTION    . WISDOM TOOTH EXTRACTION      Social History   Socioeconomic History  . Marital status: Legally Separated    Spouse name: Not on file  . Number of children: Not on file  . Years of education: Not on file  . Highest education level: Not on file  Occupational History  . Not on file  Tobacco Use  . Smoking status: Never Smoker  . Smokeless tobacco: Never Used  Substance and Sexual Activity  . Alcohol use: Yes    Comment: rare  . Drug use: No  . Sexual activity: Not on file  Other Topics Concern  . Not on file  Social History Narrative  . Not on file   Social Determinants of Health   Financial Resource Strain:   . Difficulty of Paying Living Expenses: Not on file  Food Insecurity:   . Worried About Charity fundraiser in the Last Year: Not on  file  . Ran Out of Food in the Last Year: Not on file  Transportation Needs:   . Lack of Transportation (Medical): Not on file  . Lack of Transportation (Non-Medical): Not on file  Physical Activity:   . Days of Exercise per Week: Not on file  . Minutes of Exercise per Session: Not on file  Stress:   . Feeling of Stress : Not on file  Social Connections:   . Frequency of Communication with Friends and Family: Not on file  . Frequency of Social Gatherings with Friends and Family: Not on file  . Attends Religious Services: Not on file  . Active Member of Clubs or Organizations: Not on file  . Attends Archivist Meetings: Not on file  . Marital Status: Not on file  Intimate Partner Violence:   . Fear of Current or Ex-Partner: Not on file  . Emotionally Abused: Not on file  . Physically Abused: Not on file  . Sexually Abused: Not on file      Allergies as of 05/29/2019      Reactions   Codeine Nausea And Vomiting   Prednisone Nausea And Vomiting   Hydrocodone Nausea And Vomiting      Medication List  Accurate as of May 29, 2019 11:59 AM. If you have any questions, ask your nurse or doctor.        baclofen 10 MG tablet Commonly known as: LIORESAL Take 1 tablet (10 mg total) by mouth 3 (three) times daily as needed for muscle spasms.   celecoxib 100 MG capsule Commonly known as: CELEBREX Take 1 capsule (100 mg total) by mouth 2 (two) times daily as needed.   clobetasol cream 0.05 % Commonly known as: TEMOVATE Apply 1 application topically 2 (two) times daily.   co-enzyme Q-10 50 MG capsule Take 100 mg by mouth.   famotidine 20 MG tablet Commonly known as: Pepcid Take 1 tablet (20 mg total) by mouth 2 (two) times daily.   fish oil-omega-3 fatty acids 1000 MG capsule Take 1 g by mouth every other day.   fluticasone 50 MCG/ACT nasal spray Commonly known as: FLONASE Place 2 sprays into both nostrils daily.   loratadine 10 MG tablet Commonly  known as: Claritin Take 1 tablet (10 mg total) by mouth daily as needed.   lovastatin 20 MG tablet Commonly known as: MEVACOR Take 1 tablet (20 mg total) by mouth at bedtime.   multivitamin tablet Take 1 tablet by mouth daily.   triamcinolone cream 0.1 % Commonly known as: KENALOG APPLY  CREAM EXTERNALLY TO AFFECTED AREA(S) TWICE DAILY           Objective:   Physical Exam BP 121/79 (BP Location: Left Arm, Patient Position: Sitting, Cuff Size: Small)   Pulse 93   Temp 97.7 F (36.5 C) (Temporal)   Resp 18   Ht 5\' 2"  (1.575 m)   Wt 162 lb (73.5 kg)   SpO2 99%   BMI 29.63 kg/m  General:   Well developed, NAD, BMI noted.  HEENT:  Normocephalic . Face symmetric, atraumatic Lungs:  CTA B Normal respiratory effort, no intercostal retractions, no accessory muscle use. Heart: RRR,  no murmur.  no pretibial edema bilaterally  Abdomen:  Not distended, soft, slightly tender without mass or rebound on the right lower quadrant abdomen. Skin: Not pale. Not jaundice Neurologic:  alert & oriented X3.  Speech normal, gait appropriate for age and unassisted Psych--  Cognition and judgment appear intact.  Cooperative with normal attention span and concentration.  Behavior appropriate. No anxious or depressed appearing.     Assessment    53 year old female, H/O   high cholesterol, GERD, H/O  vitamin D deficiency, abdominal hysterectomy w/ B oophorectomy, presents with  RLQ abdominal pain: Udip is negative. She has a history of abdominal hysterectomy and she reports also a B oophorectomy. This pain is concerning for early appendicitis. Plan: UA, urine culture, stat BMP and CBC. CT abdominal pelvis with contrast. She was somewhat hesitant to proceed with a CT, due to cost she requested test to be done at a different facility: [facility],  We arranged it for  3:15 PM.  The scheduler advised me to precerte the procedure with the patient's insurance.  I will try, the  patient is very aware that if I am not able to pre-CERT she needs to go to the ER within the next few hours.. Addendum 5.50 pm:  -CMP, CBC wnl. -CT 1. Appendix is 6 mm diameter, upper normal. Assessment limited by lack of intravenous contrast material. No appreciable periappendiceal edema or inflammation to suggest acute appendicitis. 2. Apparent mild fullness of the right intrarenal collecting system. No evidence for nephrolithiasis or hydroureter, although assessment for ureteral stone is  limited by the presence of oral contrast material. -CT ws done w/o contrast but appendix looks normal -Results d/w pt, monitor sxs, if increase pain  needs to seek medical attention - UCX pending  Today, I spent more than 45    min with the patient: >50% of the time counseling regards the need for a CAT scan to rule out early appendicitis, coordinating her care outside our facility, calling her with results of results and addressing her questions    This visit occurred during the SARS-CoV-2 public health emergency.  Safety protocols were in place, including screening questions prior to the visit, additional usage of staff PPE, and extensive cleaning of exam room while observing appropriate contact time as indicated for disinfecting solutions.

## 2019-05-29 NOTE — Progress Notes (Signed)
Pre visit review using our clinic review tool, if applicable. No additional management support is needed unless otherwise documented below in the visit note. 

## 2019-05-29 NOTE — Patient Instructions (Signed)
Get your blood work  Go to the [facility] in [place] at [employer].  They will proceed with a CAT scan only if I am able to pre-CERT that with your insurance.  If I am not able to pre-CERT the CAT scan you need to go on to the emergency room to rule out appendicitis.  They will call me today with the results.  Do not eat until you get the CAT scan, okay water.  At any time if you have fever, chills, increasing stomach pain: Go to the ER.

## 2019-05-30 LAB — URINALYSIS, ROUTINE W REFLEX MICROSCOPIC
Bilirubin Urine: NEGATIVE
Glucose, UA: NEGATIVE
Hgb urine dipstick: NEGATIVE
Ketones, ur: NEGATIVE
Leukocytes,Ua: NEGATIVE
Nitrite: NEGATIVE
Protein, ur: NEGATIVE
Specific Gravity, Urine: 1.02 (ref 1.001–1.03)
pH: 5.5 (ref 5.0–8.0)

## 2019-05-30 LAB — URINE CULTURE
MICRO NUMBER:: 1230303
Result:: NO GROWTH
SPECIMEN QUALITY:: ADEQUATE

## 2019-06-02 ENCOUNTER — Ambulatory Visit (INDEPENDENT_AMBULATORY_CARE_PROVIDER_SITE_OTHER): Payer: Commercial Managed Care - PPO | Admitting: Family Medicine

## 2019-06-02 ENCOUNTER — Encounter: Payer: Self-pay | Admitting: Family Medicine

## 2019-06-02 ENCOUNTER — Ambulatory Visit: Payer: Self-pay | Admitting: *Deleted

## 2019-06-02 DIAGNOSIS — R1031 Right lower quadrant pain: Secondary | ICD-10-CM | POA: Diagnosis not present

## 2019-06-02 MED ORDER — TAMSULOSIN HCL 0.4 MG PO CAPS: 0.4000 mg | ORAL_CAPSULE | Freq: Every day | ORAL | 3 refills | Status: DC

## 2019-06-02 MED ORDER — OXYCODONE HCL 5 MG PO TABS: 2.5000 mg | ORAL_TABLET | Freq: Four times a day (QID) | ORAL | 0 refills | Status: DC | PRN

## 2019-06-02 NOTE — Progress Notes (Signed)
Chief Complaint  Patient presents with  . Abdominal Pain    Christine Griffith is here for abdominal pain. Due to COVID-19 pandemic, we are interacting via web portal for an electronic face-to-face visit. I verified patient's ID using 2 identifiers. Patient agreed to proceed with visit via this method. Patient is in parked car, I am at office. Patient and I are present for visit.   RLQ abd pain starting around 1 week ago. Still constant. CT abd pelv ruled out appendicitis, did show R kidney swelling, could be a stone. She has been taking Tylenol w little relief. No fevers or urinary complaints. BM's are nml.   ROS: Constitutional: No fevers GI: No N/V/D/C, no bleeding + pain  Past Medical History:  Diagnosis Date  . Chicken pox   . Environmental allergies   . GERD (gastroesophageal reflux disease)   . Glaucoma (increased eye pressure)    Borderline  . Seasonal allergies   . Vitamin D deficiency    Family History  Problem Relation Age of Onset  . Hypertension Mother        Living  . Stroke Mother        multiple  . COPD Mother   . Diabetes Mother   . COPD Father 19       Deceased  . Bone cancer Paternal Grandmother   . Glaucoma Maternal Aunt   . Hypertension Brother        x3  . Heart disease Brother        x1  . Asthma Son   . Allergies Son   . Eczema Son        x1  . Eczema Daughter        x3   Past Surgical History:  Procedure Laterality Date  . ABDOMINAL HYSTERECTOMY  2014  . BREAST BIOPSY Right    stereo  . CESAREAN SECTION    . WISDOM TOOTH EXTRACTION     Exam No conversational dyspnea Age appropriate judgment and insight Nml affect and mood  Abdominal pain, RLQ - Plan: oxyCODONE (OXY IR/ROXICODONE) 5 MG immediate release tablet, tamsulosin (FLOMAX) 0.4 MG CAPS capsule  Given CT results, will tx for stone. If no improvement over next couple days, will return and consider TV US.  Pt voiced understanding and agreement to the plan.  Friedens, DO 06/02/19 2:16 PM

## 2019-06-02 NOTE — Telephone Encounter (Addendum)
Pt called with complaints of severe right  abdominal pain; she would like to know if her MD has looked at her CT, if there is something he can call in, does she need to see a specialist? The pt says her pain is constant, the pt was seen in the office on 05/29/2019 and had a CT done; pt advised toproceed to ED for pain management, and ongoing sympotoms; she declined and would like her MD's recommendations; the pt sees Dr Nani Ravens; [facility]; pt transferred to [facility] for final disposition.  Reason for Disposition . [1] SEVERE pain (e.g., excruciating) AND [2] present > 1 hour  Answer Assessment - Initial Assessment Questions 1. LOCATION: "Where does it hurt?"      Abdominal pain 2. RADIATION: "Does the pain shoot anywhere else?" (e.g., chest, back)      3. ONSET: "When did the pain begin?" (e.g., minutes, hours or days ago)      *No Answer* 4. SUDDEN: "Gradual or sudden onset?"     *No Answer* 5. PATTERN "Does the pain come and go, or is it constant?"    - If constant: "Is it getting better, staying the same, or worsening?"      (Note: Constant means the pain never goes away completely; most serious pain is constant and it progresses)     - If intermittent: "How long does it last?" "Do you have pain now?"     (Note: Intermittent means the pain goes away completely between bouts)     *No Answer* 6. SEVERITY: "How bad is the pain?"  (e.g., Scale 1-10; mild, moderate, or severe)   - MILD (1-3): doesn't interfere with normal activities, abdomen soft and not tender to touch    - MODERATE (4-7): interferes with normal activities or awakens from sleep, tender to touch    - SEVERE (8-10): excruciating pain, doubled over, unable to do any normal activities      *No Answer* 7. RECURRENT SYMPTOM: "Have you ever had this type of abdominal pain before?" If so, ask: "When was the last time?" and "What happened that time?"      *No Answer* 8. CAUSE: "What do you think is causing the abdominal pain?"   *No Answer* 9. RELIEVING/AGGRAVATING FACTORS: "What makes it better or worse?" (e.g., movement, antacids, bowel movement)     *No Answer* 10. OTHER SYMPTOMS: "Has there been any vomiting, diarrhea, constipation, or urine problems?"       *No Answer* 11. PREGNANCY: "Is there any chance you are pregnant?" "When was your last menstrual period?"       *No Answer*  Protocols used: ABDOMINAL PAIN - [facility]

## 2019-06-03 ENCOUNTER — Telehealth: Payer: Self-pay | Admitting: *Deleted

## 2019-06-03 NOTE — Telephone Encounter (Signed)
Left message on machine that labs were all okay and to call back to let us know how she was feeling.

## 2019-06-03 NOTE — Telephone Encounter (Signed)
-----   Message from Colon Branch, MD sent at 06/02/2019  8:09 AM EST ----- Please call patient, labs were all okay, she had abdominal pain, is she doing better?  Please let me know

## 2019-06-10 ENCOUNTER — Telehealth: Payer: Self-pay

## 2019-06-10 DIAGNOSIS — R1030 Lower abdominal pain, unspecified: Secondary | ICD-10-CM

## 2019-06-10 NOTE — Telephone Encounter (Signed)
Called, no answer/ and mail box was full so unable to leave a message

## 2019-06-10 NOTE — Telephone Encounter (Signed)
Copied from CRM #[id number]. Topic: General - Inquiry >> Jun 09, 2019  6:00 PM Youngblood, Natasha wrote: Reason for CRM: Patient called to give Dr a follow up on side still hurting. Please advise 

## 2019-06-10 NOTE — Telephone Encounter (Signed)
Our options are to have her come in in person or order the ultrasound of the pelvis. Ty.

## 2019-06-10 NOTE — Telephone Encounter (Signed)
Copied from CRM [id number]. Topic: General - Inquiry >> Jun 09, 2019  6:00 PM Leary Roca wrote: Reason for CRM: Patient called to give Dr a follow up on side still hurting. Please advise

## 2019-06-11 NOTE — Telephone Encounter (Signed)
Spoke to the patient and she would rather order the Ultra sound first. Then do appt to discuss results. So let me know what to order ALSO, it must be ordered at [facility] as is a stand alone imaging and too expensive to have done in our building.

## 2019-06-11 NOTE — Telephone Encounter (Signed)
Ordered and patient is aware//ordered to be done at [facility] in [place]

## 2019-06-11 NOTE — Addendum Note (Signed)
Addended by: Scharlene Gloss B on: 06/11/2019 08:20 AM   Modules accepted: Orders

## 2019-06-11 NOTE — Telephone Encounter (Signed)
Please place order for transvaginal ultrasound and pelvic ultrasound, non-OB. Dx lower abd pain. Ty.

## 2019-06-17 NOTE — Telephone Encounter (Signed)
The patient went to get her imaging done but states the ones ordered were not correct. She has had a complete hysterectomy and thought the U/S should be more focused in the kidney area.  They did not do Ultra sounds ordered.

## 2019-06-17 NOTE — Telephone Encounter (Signed)
Called pt to apologize for my error. She was understanding. Will refer to GI for abd pain. CT scan showed no abn, questionable stone, though she was empirically tx'd for that w mild improvement. Doubt a larger stone that wouldn't pass would be missed on imaging regardless of contrast. I discussed that with her and she in agreement with the plan.

## 2019-06-17 NOTE — Addendum Note (Signed)
Addended by: Radene Gunning on: 06/17/2019 05:19 PM   Modules accepted: Orders

## 2019-06-26 ENCOUNTER — Telehealth: Payer: Self-pay | Admitting: Family Medicine

## 2019-06-26 ENCOUNTER — Other Ambulatory Visit: Payer: Self-pay

## 2019-06-27 ENCOUNTER — Other Ambulatory Visit: Payer: Self-pay

## 2019-06-27 ENCOUNTER — Encounter: Payer: Self-pay | Admitting: Family Medicine

## 2019-06-27 ENCOUNTER — Encounter: Payer: Self-pay | Admitting: Gastroenterology

## 2019-06-27 ENCOUNTER — Ambulatory Visit: Payer: Commercial Managed Care - PPO | Admitting: Family Medicine

## 2019-06-27 VITALS — BP 108/72 | HR 83 | Temp 97.0°F | Ht 62.0 in | Wt 162.0 lb

## 2019-06-27 DIAGNOSIS — M5442 Lumbago with sciatica, left side: Secondary | ICD-10-CM

## 2019-06-27 DIAGNOSIS — R109 Unspecified abdominal pain: Secondary | ICD-10-CM | POA: Diagnosis not present

## 2019-06-27 DIAGNOSIS — E785 Hyperlipidemia, unspecified: Secondary | ICD-10-CM

## 2019-06-27 DIAGNOSIS — G8929 Other chronic pain: Secondary | ICD-10-CM | POA: Diagnosis not present

## 2019-06-27 LAB — LIPID PANEL
Cholesterol: 241 mg/dL — ABNORMAL HIGH (ref 0–200)
HDL: 55.3 mg/dL (ref >39.00)
LDL Cholesterol: 169 mg/dL — ABNORMAL HIGH (ref 0–99)
NonHDL: 185.82
Total CHOL/HDL Ratio: 4
Triglycerides: 82 mg/dL (ref 0.0–149.0)
VLDL: 16.4 mg/dL (ref 0.0–40.0)

## 2019-06-27 MED ORDER — METHYLPREDNISOLONE ACETATE 80 MG/ML IJ SUSP
80.0000 mg | Freq: Once | INTRAMUSCULAR | Status: AC
  Administered 2019-06-27: 10:00:00 80 mg via INTRAMUSCULAR

## 2019-06-27 MED ORDER — OMEPRAZOLE 20 MG PO CPDR: 20.0000 mg | DELAYED_RELEASE_CAPSULE | Freq: Every day | ORAL | 1 refills | Status: DC

## 2019-06-27 NOTE — Patient Instructions (Addendum)
Stay hydrated.  Call the GI team when convenient.  Stop the lovastatin for the next 3 weeks.   You can go on the Celebrex more routinely until your appointment, none for the rest of the day though.   Heat (pad or rice pillow in microwave) over affected area, 10-15 minutes twice daily.   We need to stretch again for the L side of your body.  Let us know if you need anything.  EXERCISES  RANGE OF MOTION (ROM) AND STRETCHING EXERCISES - Low Back Pain Most people with lower back pain will find that their symptoms get worse with excessive bending forward (flexion) or arching at the lower back (extension). The exercises that will help resolve your symptoms will focus on the opposite motion.  If you have pain, numbness or tingling which travels down into your buttocks, leg or foot, the goal of the therapy is for these symptoms to move closer to your back and eventually resolve. Sometimes, these leg symptoms will get better, but your lower back pain may worsen. This is often an indication of progress in your rehabilitation. Be very alert to any changes in your symptoms and the activities in which you participated in the 24 hours prior to the change. Sharing this information with your caregiver will allow him or her to most efficiently treat your condition. These exercises may help you when beginning to rehabilitate your injury. Your symptoms may resolve with or without further involvement from your physician, physical therapist or athletic trainer. While completing these exercises, remember:   Restoring tissue flexibility helps normal motion to return to the joints. This allows healthier, less painful movement and activity.  An effective stretch should be held for at least 30 seconds.  A stretch should never be painful. You should only feel a gentle lengthening or release in the stretched tissue. FLEXION RANGE OF MOTION AND STRETCHING EXERCISES:  STRETCH - Flexion, Single Knee to Chest   Lie on  a firm bed or floor with both legs extended in front of you.  Keeping one leg in contact with the floor, bring your opposite knee to your chest. Hold your leg in place by either grabbing behind your thigh or at your knee.  Pull until you feel a gentle stretch in your low back. Hold 30 seconds.  Slowly release your grasp and repeat the exercise with the opposite side. Repeat 2 times. Complete this exercise 3 times per week.   STRETCH - Flexion, Double Knee to Chest  Lie on a firm bed or floor with both legs extended in front of you.  Keeping one leg in contact with the floor, bring your opposite knee to your chest.  Tense your stomach muscles to support your back and then lift your other knee to your chest. Hold your legs in place by either grabbing behind your thighs or at your knees.  Pull both knees toward your chest until you feel a gentle stretch in your low back. Hold 30 seconds.  Tense your stomach muscles and slowly return one leg at a time to the floor. Repeat 2 times. Complete this exercise 3 times per week.   STRETCH - Low Trunk Rotation  Lie on a firm bed or floor. Keeping your legs in front of you, bend your knees so they are both pointed toward the ceiling and your feet are flat on the floor.  Extend your arms out to the side. This will stabilize your upper body by keeping your shoulders in contact with  the floor.  Gently and slowly drop both knees together to one side until you feel a gentle stretch in your low back. Hold for 30 seconds.  Tense your stomach muscles to support your lower back as you bring your knees back to the starting position. Repeat the exercise to the other side. Repeat 2 times. Complete this exercise at least 3 times per week.   EXTENSION RANGE OF MOTION AND FLEXIBILITY EXERCISES:  STRETCH - Extension, Prone on Elbows   Lie on your stomach on the floor, a bed will be too soft. Place your palms about shoulder width apart and at the height of  your head.  Place your elbows under your shoulders. If this is too painful, stack pillows under your chest.  Allow your body to relax so that your hips drop lower and make contact more completely with the floor.  Hold this position for 30 seconds.  Slowly return to lying flat on the floor. Repeat 2 times. Complete this exercise 3 times per week.   RANGE OF MOTION - Extension, Prone Press Ups  Lie on your stomach on the floor, a bed will be too soft. Place your palms about shoulder width apart and at the height of your head.  Keeping your back as relaxed as possible, slowly straighten your elbows while keeping your hips on the floor. You may adjust the placement of your hands to maximize your comfort. As you gain motion, your hands will come more underneath your shoulders.  Hold this position 30 seconds.  Slowly return to lying flat on the floor. Repeat 2 times. Complete this exercise 3 times per week.   RANGE OF MOTION- Quadruped, Neutral Spine   Assume a hands and knees position on a firm surface. Keep your hands under your shoulders and your knees under your hips. You may place padding under your knees for comfort.  Drop your head and point your tailbone toward the ground below you. This will round out your lower back like an angry cat. Hold this position for 30 seconds.  Slowly lift your head and release your tail bone so that your back sags into a large arch, like an old horse.  Hold this position for 30 seconds.  Repeat this until you feel limber in your low back.  Now, find your "sweet spot." This will be the most comfortable position somewhere between the two previous positions. This is your neutral spine. Once you have found this position, tense your stomach muscles to support your low back.  Hold this position for 30 seconds. Repeat 2 times. Complete this exercise 3 times per week.   STRENGTHENING EXERCISES - Low Back Sprain These exercises may help you when beginning  to rehabilitate your injury. These exercises should be done near your "sweet spot." This is the neutral, low-back arch, somewhere between fully rounded and fully arched, that is your least painful position. When performed in this safe range of motion, these exercises can be used for people who have either a flexion or extension based injury. These exercises may resolve your symptoms with or without further involvement from your physician, physical therapist or athletic trainer. While completing these exercises, remember:   Muscles can gain both the endurance and the strength needed for everyday activities through controlled exercises.  Complete these exercises as instructed by your physician, physical therapist or athletic trainer. Increase the resistance and repetitions only as guided.  You may experience muscle soreness or fatigue, but the pain or discomfort you are  trying to eliminate should never worsen during these exercises. If this pain does worsen, stop and make certain you are following the directions exactly. If the pain is still present after adjustments, discontinue the exercise until you can discuss the trouble with your caregiver.  STRENGTHENING - Deep Abdominals, Pelvic Tilt   Lie on a firm bed or floor. Keeping your legs in front of you, bend your knees so they are both pointed toward the ceiling and your feet are flat on the floor.  Tense your lower abdominal muscles to press your low back into the floor. This motion will rotate your pelvis so that your tail bone is scooping upwards rather than pointing at your feet or into the floor. With a gentle tension and even breathing, hold this position for 3 seconds. Repeat 2 times. Complete this exercise 3 times per week.   STRENGTHENING - Abdominals, Crunches   Lie on a firm bed or floor. Keeping your legs in front of you, bend your knees so they are both pointed toward the ceiling and your feet are flat on the floor. Cross your arms  over your chest.  Slightly tip your chin down without bending your neck.  Tense your abdominals and slowly lift your trunk high enough to just clear your shoulder blades. Lifting higher can put excessive stress on the lower back and does not further strengthen your abdominal muscles.  Control your return to the starting position. Repeat 2 times. Complete this exercise 3 times per week.   STRENGTHENING - Quadruped, Opposite UE/LE Lift   Assume a hands and knees position on a firm surface. Keep your hands under your shoulders and your knees under your hips. You may place padding under your knees for comfort.  Find your neutral spine and gently tense your abdominal muscles so that you can maintain this position. Your shoulders and hips should form a rectangle that is parallel with the floor and is not twisted.  Keeping your trunk steady, lift your right hand no higher than your shoulder and then your left leg no higher than your hip. Make sure you are not holding your breath. Hold this position for 30 seconds.  Continuing to keep your abdominal muscles tense and your back steady, slowly return to your starting position. Repeat with the opposite arm and leg. Repeat 2 times. Complete this exercise 3 times per week.   STRENGTHENING - Abdominals and Quadriceps, Straight Leg Raise   Lie on a firm bed or floor with both legs extended in front of you.  Keeping one leg in contact with the floor, bend the other knee so that your foot can rest flat on the floor.  Find your neutral spine, and tense your abdominal muscles to maintain your spinal position throughout the exercise.  Slowly lift your straight leg off the floor about 6 inches for a count of 3, making sure to not hold your breath.  Still keeping your neutral spine, slowly lower your leg all the way to the floor. Repeat this exercise with each leg 2 times. Complete this exercise 3 times per week.  POSTURE AND BODY MECHANICS CONSIDERATIONS  - Low Back Sprain Keeping correct posture when sitting, standing or completing your activities will reduce the stress put on different body tissues, allowing injured tissues a chance to heal and limiting painful experiences. The following are general guidelines for improved posture.  While reading these guidelines, remember:  The exercises prescribed by your provider will help you have the flexibility and  strength to maintain correct postures.  The correct posture provides the best environment for your joints to work. All of your joints have less wear and tear when properly supported by a spine with good posture. This means you will experience a healthier, less painful body.  Correct posture must be practiced with all of your activities, especially prolonged sitting and standing. Correct posture is as important when doing repetitive low-stress activities (typing) as it is when doing a single heavy-load activity (lifting).  RESTING POSITIONS Consider which positions are most painful for you when choosing a resting position. If you have pain with flexion-based activities (sitting, bending, stooping, squatting), choose a position that allows you to rest in a less flexed posture. You would want to avoid curling into a fetal position on your side. If your pain worsens with extension-based activities (prolonged standing, working overhead), avoid resting in an extended position such as sleeping on your stomach. Most people will find more comfort when they rest with their spine in a more neutral position, neither too rounded nor too arched. Lying on a non-sagging bed on your side with a pillow between your knees, or on your back with a pillow under your knees will often provide some relief. Keep in mind, being in any one position for a prolonged period of time, no matter how correct your posture, can still lead to stiffness.  PROPER SITTING POSTURE In order to minimize stress and discomfort on your spine, you  must sit with correct posture. Sitting with good posture should be effortless for a healthy body. Returning to good posture is a gradual process. Many people can work toward this most comfortably by using various supports until they have the flexibility and strength to maintain this posture on their own. When sitting with proper posture, your ears will fall over your shoulders and your shoulders will fall over your hips. You should use the back of the chair to support your upper back. Your lower back will be in a neutral position, just slightly arched. You may place a small pillow or folded towel at the base of your lower back for  support.  When working at a desk, create an environment that supports good, upright posture. Without extra support, muscles tire, which leads to excessive strain on joints and other tissues. Keep these recommendations in mind:  CHAIR:  A chair should be able to slide under your desk when your back makes contact with the back of the chair. This allows you to work closely.  The chair's height should allow your eyes to be level with the upper part of your monitor and your hands to be slightly lower than your elbows.  BODY POSITION  Your feet should make contact with the floor. If this is not possible, use a foot rest.  Keep your ears over your shoulders. This will reduce stress on your neck and low back.  INCORRECT SITTING POSTURES  If you are feeling tired and unable to assume a healthy sitting posture, do not slouch or slump. This puts excessive strain on your back tissues, causing more damage and pain. Healthier options include:  Using more support, like a lumbar pillow.  Switching tasks to something that requires you to be upright or walking.  Talking a brief walk.  Lying down to rest in a neutral-spine position.  PROLONGED STANDING WHILE SLIGHTLY LEANING FORWARD  When completing a task that requires you to lean forward while standing in one place for a long  time, place either  foot up on a stationary 2-4 inch high object to help maintain the best posture. When both feet are on the ground, the lower back tends to lose its slight inward curve. If this curve flattens (or becomes too large), then the back and your other joints will experience too much stress, tire more quickly, and can cause pain.  CORRECT STANDING POSTURES Proper standing posture should be assumed with all daily activities, even if they only take a few moments, like when brushing your teeth. As in sitting, your ears should fall over your shoulders and your shoulders should fall over your hips. You should keep a slight tension in your abdominal muscles to brace your spine. Your tailbone should point down to the ground, not behind your body, resulting in an over-extended swayback posture.   INCORRECT STANDING POSTURES  Common incorrect standing postures include a forward head, locked knees and/or an excessive swayback. WALKING Walk with an upright posture. Your ears, shoulders and hips should all line-up.  PROLONGED ACTIVITY IN A FLEXED POSITION When completing a task that requires you to bend forward at your waist or lean over a low surface, try to find a way to stabilize 3 out of 4 of your limbs. You can place a hand or elbow on your thigh or rest a knee on the surface you are reaching across. This will provide you more stability, so that your muscles do not tire as quickly. By keeping your knees relaxed, or slightly bent, you will also reduce stress across your lower back. CORRECT LIFTING TECHNIQUES  DO :  Assume a wide stance. This will provide you more stability and the opportunity to get as close as possible to the object which you are lifting.  Tense your abdominals to brace your spine. Bend at the knees and hips. Keeping your back locked in a neutral-spine position, lift using your leg muscles. Lift with your legs, keeping your back straight.  Test the weight of unknown objects  before attempting to lift them.  Try to keep your elbows locked down at your sides in order get the best strength from your shoulders when carrying an object.     Always ask for help when lifting heavy or awkward objects. INCORRECT LIFTING TECHNIQUES DO NOT:   Lock your knees when lifting, even if it is a small object.  Bend and twist. Pivot at your feet or move your feet when needing to change directions.  Assume that you can safely pick up even a paperclip without proper posture.

## 2019-06-27 NOTE — Progress Notes (Signed)
Chief Complaint  Patient presents with  . Follow-up  . Flank Pain    Subjective: Hyperlipidemia Patient presents for Hyperlipidemia follow up. Currently taking lovastatin 20 mg/d and compliance with treatment thus far has been good. She complains of myalgias. She is adhering to a healthy diet. Exercise: none The patient is not known to have coexisting coronary artery disease.  Patient has been doing with right-sided abdominal/side pain over the past month.  She had a negative CT scan.  She has an appointment with the pain management team and gastroenterology coming up.  Cold water seems to help.  It seems more higher on the side at this point.  She was treated for a kidney stone as well that did not help.  She has been using Celebrex and baclofen intermittently.  She gets some relief.  Tylenol is less helpful.  Patient has a history of low back pain.  She has not been compliant with her home stretches/exercises.  She is starting to get shooting pain that also feels like an electric shock down her left leg with certain movements.  No loss of control of her bowel or bladder function.  ROS: Heart: Denies chest pain or palpitations Lungs: Denies SOB or cough  Past Medical History:  Diagnosis Date  . Chicken pox   . Environmental allergies   . GERD (gastroesophageal reflux disease)   . Glaucoma (increased eye pressure)    Borderline  . Seasonal allergies   . Vitamin D deficiency     Objective: BP 108/72 (BP Location: Left Arm, Patient Position: Sitting, Cuff Size: Normal)   Pulse 83   Temp (!) 97 F (36.1 C) (Temporal)   Ht 5\' 2"  (1.575 m)   Wt 162 lb (73.5 kg)   SpO2 95%   BMI 29.63 kg/m  General: Awake, appears stated age HEENT: MMM Heart: RRR, no LE edema, no bruits Lungs: CTAB, no rales, wheezes or rhonchi. No accessory muscle use Abd: S, NT, ND MSK:+ttp over R side, no lumbar ttp, neg straight leg, poor hamstring rom b/l Psych: Age appropriate judgment and insight,  normal affect and mood  Assessment and Plan: Side pain - Plan: methylPREDNISolone acetate (DEPO-MEDROL) injection 80 mg  Chronic left-sided low back pain with left-sided sciatica - Plan: methylPREDNISolone acetate (DEPO-MEDROL) injection 80 mg  Hyperlipidemia, unspecified hyperlipidemia type - Plan: Lipid panel  1- statin holiday. Ice, heat, consider PT. Has GI appt in future as well as pain management. 2- Needs to get back on HEP. Heat. Depo shot today. 3- Stop lovastatin for 3 weeks, ck labs today. Counseled on diet and exercise.  F/u pending above. The patient voiced understanding and agreement to the plan.  [place], DO 06/27/19  12:21 PM

## 2019-07-10 ENCOUNTER — Encounter: Payer: Self-pay | Admitting: Gastroenterology

## 2019-07-10 ENCOUNTER — Other Ambulatory Visit: Payer: Self-pay

## 2019-07-10 ENCOUNTER — Ambulatory Visit: Payer: Commercial Managed Care - PPO | Admitting: Gastroenterology

## 2019-07-10 VITALS — BP 116/72 | HR 80 | Temp 96.8°F | Ht 62.0 in | Wt 164.0 lb

## 2019-07-10 DIAGNOSIS — E663 Overweight: Secondary | ICD-10-CM | POA: Diagnosis not present

## 2019-07-10 DIAGNOSIS — K219 Gastro-esophageal reflux disease without esophagitis: Secondary | ICD-10-CM

## 2019-07-10 DIAGNOSIS — R140 Abdominal distension (gaseous): Secondary | ICD-10-CM

## 2019-07-10 DIAGNOSIS — R635 Abnormal weight gain: Secondary | ICD-10-CM | POA: Diagnosis not present

## 2019-07-10 NOTE — Patient Instructions (Addendum)
You have been given information of a Low FODMAP diet.  Stop Zantac  Return in 6-12 months  It was a pleasure to see you today!  Vito Cirigliano, D.O.

## 2019-07-10 NOTE — Progress Notes (Signed)
Chief Complaint: RUQ pain, GERD, bloating  Referring Provider:     Sharlene Dory, DO   HPI:    Christine Griffith is a 54 y.o. female with a hx of HLD, GERD, referred to the [facility]ology [facility] for evaluation of right sided abdominal pain x1-2 months along with GERD and bloating.  +bloating and abdominal distension in upper abdomen. Typically post prandial but not associated with certain types of food. Has also gained approximately 20# in the last 6 months.   No improvement with empiric trial for possible kidney stones.   Started taking Probiotics along with Rolaids with some improvement.  Takes Vit C, MVI.   Reflux well controlled with omeprazole. Index sxs of HB, regurgitation. Worse with tomato based sauces, OJ. No dysphagia. Was started on Prilosec 20 mg in 06/2019 with good control of reflux. Has been taking ranitidine since 02/2019 (stopping today).  No lower GI sxs.    Right-sided abdominal pain associated with the bloating and abdominal distention.  Some improvement with previous trials of Celebrex and Baclofen.   Hx of back pain as well and was given methyprednisolone injection on 06/27/19. Was referred to GI and Pain Management  Has been extensively evaluated for similar GI symptoms in the past, to include GI evaluation at [facility] in 2017, as outlined below.  GI Evaluation to date: - 05/2019: CT abd/pelvis (indication right abd pain): Tiny hypodensity in left liver dome-stable, o/w normal - 05/2019: normal CMP, UA, CBC - 09/2017: CT abd/pelvis (indication: abdominal pain): Multiple hepatic cysts, 4 mm hypervascular lesion in right upper liver (small shunt vs flash hemangioma), o/w normal - 08/2015: Abd [place] (inidcation: RUQ/back pain): Normal - 2017: Evaluated at [facility] for RUQ pain, bloating, constipation, and GERD with EGD/Colo as below - 05/2016: RUQ [place]: Normal - 03/2016: HIDA ordered but not done   Endoscopic Hx: - EGD (02/2016, [facility]): No report  in EMR for review, but normal bxs from duodenal, gastric, GEJ. Fundic gland polyps - Colonoscopy (02/2016, [facility]): Normal per patient. No report in EMR for review. Was told to repeat in 10 years.   Past Medical History:  Diagnosis Date  . Chicken pox   . Environmental allergies   . GERD (gastroesophageal reflux disease)   . Glaucoma (increased eye pressure)    Borderline  . Seasonal allergies   . Vitamin D deficiency      Past Surgical History:  Procedure Laterality Date  . ABDOMINAL HYSTERECTOMY  2014  . BREAST BIOPSY Right    stereo  . CESAREAN SECTION    . WISDOM TOOTH EXTRACTION     Family History  Problem Relation Age of Onset  . Hypertension Mother        Living  . Stroke Mother        multiple  . COPD Mother   . Diabetes Mother   . COPD Father 53       Deceased  . Bone cancer Paternal Grandmother   . Glaucoma Maternal Aunt   . Hypertension Brother        x3  . Heart disease Brother        x1  . Asthma Son   . Allergies Son   . Eczema Son        x1  . Eczema Daughter        x3   Social History   Tobacco Use  . Smoking status: Never Smoker  .  Smokeless tobacco: Never Used  Substance Use Topics  . Alcohol use: Yes    Comment: rare  . Drug use: No   Current Outpatient Medications  Medication Sig Dispense Refill  . Ascorbic Acid (VITAMIN C) 100 MG tablet Take 100 mg by mouth daily.    . baclofen (LIORESAL) 10 MG tablet Take 1 tablet (10 mg total) by mouth 3 (three) times daily as needed for muscle spasms. 30 tablet 5  . calcium carbonate (TUMS - DOSED IN MG ELEMENTAL CALCIUM) 500 MG chewable tablet Chew 1 tablet by mouth daily.    . celecoxib (CELEBREX) 100 MG capsule Take 1 capsule (100 mg total) by mouth 2 (two) times daily as needed. 60 capsule 5  . clobetasol cream (TEMOVATE) 0.05 % Apply 1 application topically 2 (two) times daily. 30 g 0  . co-enzyme Q-10 50 MG capsule Take 100 mg by mouth.    . fish oil-omega-3 fatty acids 1000 MG capsule Take 1  g by mouth every other day.     . fluticasone (FLONASE) 50 MCG/ACT nasal spray Place 2 sprays into both nostrils daily. 16 g 6  . Lactobacillus (PROBIOTIC ACIDOPHILUS PO) Take by mouth.    . loratadine (CLARITIN) 10 MG tablet Take 1 tablet (10 mg total) by mouth daily as needed. 30 tablet 3  . Multiple Vitamin (MULTIVITAMIN) tablet Take 1 tablet by mouth daily. 90 tablet 1  . omeprazole (PRILOSEC) 20 MG capsule Take 1 capsule (20 mg total) by mouth daily. 90 capsule 1  . raNITIdine HCl (RANITIDINE 75 PO) Take by mouth.    . triamcinolone cream (KENALOG) 0.1 % APPLY  CREAM EXTERNALLY TO AFFECTED AREA(S) TWICE DAILY 30 g 0  . lovastatin (MEVACOR) 20 MG tablet Take 1 tablet (20 mg total) by mouth at bedtime. (Patient not taking: Reported on 07/10/2019) 90 tablet 3   No current facility-administered medications for this visit.   Allergies  Allergen Reactions  . Codeine Nausea And Vomiting  . Prednisone Nausea And Vomiting  . Hydrocodone Nausea And Vomiting     Review of Systems: All systems reviewed and negative except where noted in HPI.     Physical Exam:    Wt Readings from Last 3 Encounters:  07/10/19 164 lb (74.4 kg)  06/27/19 162 lb (73.5 kg)  05/29/19 162 lb (73.5 kg)    BP 116/72   Pulse 80   Temp (!) 96.8 F (36 C)   Ht 5\' 2"  (1.575 m)   Wt 164 lb (74.4 kg)   BMI 30.00 kg/m  Constitutional:  Pleasant, in no acute distress. Psychiatric: Normal mood and affect. Behavior is normal. EENT: Pupils normal.  Conjunctivae are normal. No scleral icterus. Neck supple. No cervical LAD. Cardiovascular: Normal rate, regular rhythm. No edema Pulmonary/chest: Effort normal and breath sounds normal. No wheezing, rales or rhonchi. Abdominal: Soft, nondistended, nontender. Bowel sounds active throughout. There are no masses palpable. No hepatomegaly. Neurological: Alert and oriented to person place and time. Skin: Skin is warm and dry. No rashes noted.   ASSESSMENT AND PLAN;    1) Abdominal bloating 2) Abdominal distention Suspect IBS/functional syndrome.  Has had an extensive evaluation in the past, to include multiple CTs, abdominal ultrasound, EGD, colonoscopy, along with various lab work.  All largely unrevealing.  - Low FODMAP diet.  Provided with handout and detailed explanation - Discussed trial of Levsin, Bentyl, etc, along with trial of rifaximin, glutamine, and she declined any new medications at this time - No plan  for repeat EGD at this juncture -Okay to resume probiotic -Does not want to pursue any endoscopic evaluation  3) GERD: -Well-controlled since starting omeprazole 20 mg/day -Reflux symptoms likely also related to weight gain over the last 6+ months in the setting of exercise/activity restrictions due to pandemic -Resume antireflux lifestyle/dietary modifications -Avoid overeating, avoid eating close to bedtime -Stop ranitidine  4) Weight gain/Overweight (BMI 30.0) -Weight gain and body habitus likely playing a role in her above reflux.  Similarly, dietary indiscretions seem to be playing a role with her abdominal bloating/distention. -Goal for at least 10% body weight loss done slowly over weeks to months with diet/exercise modifications   Up-to-date on CRC screening.  Repeat colonoscopy in 2027 for routrine CRC screening  I spent 45 minutes of time, including in depth chart review, independent review of results as outlined above, communicating results with the patient directly, face-to-face time with the patient, coordinating care, ordering studies and medications as appropriate, and documentation.    Lavena Bullion, DO, FACG  07/10/2019, 10:45 AM   Nani Ravens, [place]*

## 2019-08-05 ENCOUNTER — Other Ambulatory Visit: Payer: Self-pay | Admitting: Family Medicine

## 2019-08-06 ENCOUNTER — Telehealth: Payer: Self-pay | Admitting: Family Medicine

## 2019-08-08 ENCOUNTER — Encounter: Payer: Self-pay | Admitting: Family Medicine

## 2019-08-08 ENCOUNTER — Ambulatory Visit: Payer: Commercial Managed Care - PPO | Admitting: Family Medicine

## 2019-08-08 ENCOUNTER — Other Ambulatory Visit: Payer: Self-pay

## 2019-08-08 VITALS — BP 108/68 | HR 84 | Temp 96.6°F | Ht 62.0 in | Wt 163.2 lb

## 2019-08-08 DIAGNOSIS — R8290 Unspecified abnormal findings in urine: Secondary | ICD-10-CM

## 2019-08-08 DIAGNOSIS — G8929 Other chronic pain: Secondary | ICD-10-CM

## 2019-08-08 DIAGNOSIS — R319 Hematuria, unspecified: Secondary | ICD-10-CM

## 2019-08-08 DIAGNOSIS — E782 Mixed hyperlipidemia: Secondary | ICD-10-CM

## 2019-08-08 DIAGNOSIS — M546 Pain in thoracic spine: Secondary | ICD-10-CM

## 2019-08-08 LAB — POC URINALSYSI DIPSTICK (AUTOMATED)
Bilirubin, UA: NEGATIVE
Blood, UA: POSITIVE
Glucose, UA: NEGATIVE
Ketones, UA: NEGATIVE
Leukocytes, UA: NEGATIVE
Nitrite, UA: NEGATIVE
Protein, UA: NEGATIVE
Spec Grav, UA: 1.025 (ref 1.010–1.025)
Urobilinogen, UA: 0.2 E.U./dL
pH, UA: 6 (ref 5.0–8.0)

## 2019-08-08 MED ORDER — CYCLOBENZAPRINE HCL 10 MG PO TABS: 5.0000 mg | ORAL_TABLET | Freq: Three times a day (TID) | ORAL | 0 refills | Status: DC | PRN

## 2019-08-08 NOTE — Patient Instructions (Signed)
OK to take Tylenol 1000 mg (2 extra strength tabs) or 975 mg (3 regular strength tabs) every 6 hours as needed.  Ice/cold pack over area for 10-15 min twice daily.  Heat (pad or rice pillow in microwave) over affected area, 10-15 minutes twice daily.   Massage area after heating.   Mid-Back Strain Rehab It is normal to feel mild stretching, pulling, tightness, or discomfort as you do these exercises, but you should stop right away if you feel sudden pain or your pain gets worse.   Stretching and range of motion exercises This exercise warms up your muscles and joints and improves the movement and flexibility of your back and shoulders. This exercise also help to relieve pain. Exercise A: Chest and spine stretch    1. Lie down on your back on a firm surface. 2. Roll a towel or a small blanket so it is about 4 inches (10 cm) in diameter. 3. Put the towel lengthwise under the middle of your back so it is under your spine, but not under your shoulder blades. 4. To increase the stretch, you may put your hands behind your head and let your elbows fall to your sides. 5. Hold for 30 seconds. Repeat exercise 2 times. Complete this exercise 3 times per week.  Strengthening exercises These exercises build strength and endurance in your back and your shoulder blade muscles. Endurance is the ability to use your muscles for a long time, even after they get tired. Exercise B: Alternating arm and leg raises    1. Get on your hands and knees on a firm surface. If you are on a hard floor, you may want to use padding to cushion your knees, such as an exercise mat. 2. Line up your arms and legs. Your hands should be below your shoulders, and your knees should be below your hips. 3. Lift your left leg behind you. At the same time, raise your right arm and straighten it in front of you. ? Do not lift your leg higher than your hip. ? Do not lift your arm higher than your shoulder. ? Keep your abdominal  and back muscles tight. ? Keep your hips facing the ground. ? Do not arch your back. ? Keep your balance carefully, and do not hold your breath. 4. Hold for 3 seconds. 5. Slowly return to the starting position and repeat with your right leg and your left arm. Repeat 2 times. Complete this exercise 3 times per week. Exercise C: Straight arm rows (shoulder extension)     1. Stand with your feet shoulder width apart. 2. Secure an exercise band to a stable object in front of you so the band is at or above shoulder height. 3. Hold one end of the exercise band in each hand. 4. Straighten your elbows and lift your hands up to shoulder height. 5. Step back, away from the secured end of the exercise band, until the band stretches. 6. Squeeze your shoulder blades together and pull your hands down to the sides of your thighs. Stop when your hands are straight down by your sides. Do not let your hands go behind your body. 7. Hold for 3 seconds. 8. Slowly return to the starting position. Repeat 2 times. Complete this exercise 3 times per week. Exercise D: Shoulder external rotation, prone 1. Lie on your abdomen on a firm bed so your left / right forearm hangs over the edge of the bed and your upper arm is on the  bed, straight out from your body. ? Your elbow should be bent. ? Your palm should be facing your feet. 2. If instructed, hold a 5 lb weight in your hand. 3. Squeeze your shoulder blade toward the middle of your back. Do not let your shoulder lift toward your ear. 4. Keep your elbow bent in an "L" shape (90 degrees) while you slowly move your forearm up toward the ceiling. Move your forearm up to the height of the bed, toward your head. ? Your upper arm should not move. ? At the top of the movement, your palm should face the floor. 5. Hold for 3 seconds. 6. Slowly return to the starting position and relax your muscles. Repeat 3 times. Complete this exercise 3 times per week. Exercise E:  Scapular retraction and external rotation, rowing    1. Sit in a stable chair without armrests, or stand. 2. Secure an exercise band to a stable object in front of you so it is at shoulder height. 3. Hold one end of the exercise band in each hand. 4. Bring your arms out straight in front of you. 5. Step back, away from the secured end of the exercise band, until the band stretches. 6. Pull the band backward. As you do this, bend your elbows and squeeze your shoulder blades together, but avoid letting the rest of your body move. Do not let your shoulders lift up toward your ears. 7. Stop when your elbows are at your sides or slightly behind your body. 8. Hold for 5 seconds. 9. Slowly straighten your arms to return to the starting position. Repeat 2 times. Complete this exercise 3 times per week. Posture and body mechanics    Body mechanics refers to the movements and positions of your body while you do your daily activities. Posture is part of body mechanics. Good posture and healthy body mechanics can help to relieve stress in your body's tissues and joints. Good posture means that your spine is in its natural S-curve position (your spine is neutral), your shoulders are pulled back slightly, and your head is not tipped forward. The following are general guidelines for applying improved posture and body mechanics to your everyday activities. Standing     When standing, keep your spine neutral and your feet about hip-width apart. Keep a slight bend in your knees. Your ears, shoulders, and hips should line up.  When you do a task in which you lean forward while standing in one place for a long time, place one foot up on a stable object that is 2-4 inches (5-10 cm) high, such as a footstool. This helps keep your spine neutral. Sitting     When sitting, keep your spine neutral and keep your feet flat on the floor. Use a footrest, if necessary, and keep your thighs parallel to the floor. Avoid  rounding your shoulders, and avoid tilting your head forward.  When working at a desk or a computer, keep your desk at a height where your hands are slightly lower than your elbows. Slide your chair under your desk so you are close enough to maintain good posture.  When working at a computer, place your monitor at a height where you are looking straight ahead and you do not have to tilt your head forward or downward to look at the screen. Resting    When lying down and resting, avoid positions that are most painful for you.  If you have pain with activities such as sitting, bending,  stooping, or squatting (flexion-based activities), lie in a position in which your body does not bend very much. For example, avoid curling up on your side with your arms and knees near your chest (fetal position).  If you have pain with activities such as standing for a long time or reaching with your arms (extension-based activities), lie with your spine in a neutral position and bend your knees slightly. Try the following positions:  Lying on your side with a pillow between your knees.  Lying on your back with a pillow under your knees.   Lifting     When lifting objects, keep your feet at least shoulder-width apart and tighten your abdominal muscles.  Bend your knees and hips and keep your spine neutral. It is important to lift using the strength of your legs, not your back. Do not lock your knees straight out.  Always ask for help to lift heavy or awkward objects. Make sure you discuss any questions you have with your health care provider.

## 2019-08-08 NOTE — Progress Notes (Signed)
Chief Complaint  Patient presents with  . Flank Pain     right and urine odor    Subjective: Patient is a 54 y.o. female here for f/u.  Describes R flank pain that seems to have been plaguing her for around 3 mo now. No inj or change in activity. Does have some odor to her urine, but no bleeding, d/c, freq, urgency, or current abd pain. She was evaluated by the GI team where low FODMAP rec'd. Failed empiric tx for renal stone given some hydronephrosis seen on CT. Ct was neg for appendicitis and other intraabd pathosis. She is not having any bruising, rashes, or redness. Certain positions seem to make this achy pain worse. Radiates around to the front of her torso. Nothing has really made the pain better. She has a hx of LBP where she was given stretches and exercises by me that she has tried to do. No neuro s/s's.    ROS: MSK: +back pain  Past Medical History:  Diagnosis Date  . Chicken pox   . Environmental allergies   . GERD (gastroesophageal reflux disease)   . Glaucoma (increased eye pressure)    Borderline  . Seasonal allergies   . Vitamin D deficiency     Objective: BP 108/68 (BP Location: Left Arm, Patient Position: Sitting, Cuff Size: Normal)   Pulse 84   Temp (!) 96.6 F (35.9 C) (Temporal)   Ht 5\' 2"  (1.575 m)   Wt 163 lb 4 oz (74 kg)   SpO2 96%   BMI 29.86 kg/m  General: Awake, appears stated age HEENT: MMM, EOMi MSK: +TTP over the lower thor parasp msc on R; +tonicity Lungs: No accessory muscle use Psych: Age appropriate judgment and insight, normal affect and mood  Assessment and Plan: Chronic right-sided thoracic back pain - Plan: cyclobenzaprine (FLEXERIL) 10 MG tablet  Abnormal urine odor - Plan: POCT Urinalysis Dipstick (Automated), Urine Culture  Hematuria, unspecified type - Plan: POCT Urinalysis Dipstick (Automated), Urine Culture  Mixed hyperlipidemia - Plan: Lipid panel  Trial Flexeril. Warned about drowsiness. Mid back stretches/exercises  provided. Heat. Ice. If no improvement, will send back to PT for a few sessions. I think she would respond to OMT if cost/access is an issue. Ck urine culture. If neg, will ck microscopy in future.  She will pick up form for work that I am filling out ) should she require in future.  F/u prn. The patient voiced understanding and agreement to the plan.  [employer], DO 08/08/19  7:31 PM

## 2019-08-09 LAB — URINE CULTURE
MICRO NUMBER:: 10219908
Result:: NO GROWTH
SPECIMEN QUALITY:: ADEQUATE

## 2019-08-11 ENCOUNTER — Telehealth: Payer: Self-pay | Admitting: Family Medicine

## 2019-08-11 ENCOUNTER — Other Ambulatory Visit: Payer: Self-pay | Admitting: Family Medicine

## 2019-08-11 DIAGNOSIS — R319 Hematuria, unspecified: Secondary | ICD-10-CM

## 2019-08-11 NOTE — Telephone Encounter (Signed)
PCP completed FMLA and faxed to [phone] Original sent to scan///copied for the patient to pickup at the office.

## 2019-08-12 ENCOUNTER — Other Ambulatory Visit (INDEPENDENT_AMBULATORY_CARE_PROVIDER_SITE_OTHER): Payer: Commercial Managed Care - PPO

## 2019-08-12 ENCOUNTER — Other Ambulatory Visit: Payer: Self-pay | Admitting: Family Medicine

## 2019-08-12 ENCOUNTER — Other Ambulatory Visit: Payer: Self-pay

## 2019-08-12 DIAGNOSIS — R319 Hematuria, unspecified: Secondary | ICD-10-CM | POA: Diagnosis not present

## 2019-08-12 DIAGNOSIS — E782 Mixed hyperlipidemia: Secondary | ICD-10-CM | POA: Diagnosis not present

## 2019-08-12 LAB — URINALYSIS, MICROSCOPIC ONLY
RBC / HPF: NONE SEEN (ref 0–?)
WBC, UA: NONE SEEN (ref 0–?)

## 2019-08-12 LAB — LIPID PANEL
Cholesterol: 224 mg/dL — ABNORMAL HIGH (ref 0–200)
HDL: 50.6 mg/dL (ref >39.00)
LDL Cholesterol: 156 mg/dL — ABNORMAL HIGH (ref 0–99)
NonHDL: 173.15
Total CHOL/HDL Ratio: 4
Triglycerides: 84 mg/dL (ref 0.0–149.0)
VLDL: 16.8 mg/dL (ref 0.0–40.0)

## 2019-08-12 MED ORDER — ROSUVASTATIN CALCIUM 20 MG PO TABS: 20.0000 mg | ORAL_TABLET | Freq: Every day | ORAL | 3 refills | Status: DC

## 2019-08-13 ENCOUNTER — Other Ambulatory Visit: Payer: Self-pay | Admitting: Family Medicine

## 2019-08-13 DIAGNOSIS — E782 Mixed hyperlipidemia: Secondary | ICD-10-CM

## 2019-08-14 ENCOUNTER — Telehealth: Payer: Self-pay

## 2019-08-14 NOTE — Telephone Encounter (Signed)
Patient called in to see if Dr. Carmelia Roller or the nurse can give her a call to discuss her Lab results. Please call the patient at [phone]   Thanks,

## 2019-08-15 ENCOUNTER — Ambulatory Visit: Payer: Commercial Managed Care - PPO | Admitting: Family Medicine

## 2019-08-15 ENCOUNTER — Other Ambulatory Visit: Payer: Self-pay | Admitting: Family Medicine

## 2019-08-15 DIAGNOSIS — E782 Mixed hyperlipidemia: Secondary | ICD-10-CM

## 2019-08-15 NOTE — Telephone Encounter (Signed)
Please see result notes/have called her several times but get only her voice mail. Was out of the office Thursday 08/14/19 and will try to contact her again today

## 2019-09-26 ENCOUNTER — Other Ambulatory Visit (INDEPENDENT_AMBULATORY_CARE_PROVIDER_SITE_OTHER): Payer: Commercial Managed Care - PPO

## 2019-09-26 ENCOUNTER — Other Ambulatory Visit: Payer: Self-pay

## 2019-09-26 DIAGNOSIS — E782 Mixed hyperlipidemia: Secondary | ICD-10-CM | POA: Diagnosis not present

## 2019-09-26 LAB — LIPID PANEL
Cholesterol: 188 mg/dL (ref 0–200)
HDL: 44.3 mg/dL (ref >39.00)
LDL Cholesterol: 128 mg/dL — ABNORMAL HIGH (ref 0–99)
NonHDL: 144.04
Total CHOL/HDL Ratio: 4
Triglycerides: 78 mg/dL (ref 0.0–149.0)
VLDL: 15.6 mg/dL (ref 0.0–40.0)

## 2019-09-26 LAB — HEPATIC FUNCTION PANEL
ALT: 19 U/L (ref 0–35)
AST: 17 U/L (ref 0–37)
Albumin: 3.9 g/dL (ref 3.5–5.2)
Alkaline Phosphatase: 95 U/L (ref 39–117)
Bilirubin, Direct: 0.1 mg/dL (ref 0.0–0.3)
Total Bilirubin: 0.5 mg/dL (ref 0.2–1.2)
Total Protein: 6.6 g/dL (ref 6.0–8.3)

## 2019-10-20 ENCOUNTER — Other Ambulatory Visit: Payer: Self-pay | Admitting: Family Medicine

## 2019-10-20 MED ORDER — ATORVASTATIN CALCIUM 10 MG PO TABS: 10.0000 mg | ORAL_TABLET | Freq: Every day | ORAL | 3 refills | Status: DC

## 2019-10-27 ENCOUNTER — Telehealth: Payer: Self-pay | Admitting: Family Medicine

## 2019-10-27 NOTE — Telephone Encounter (Signed)
error 

## 2019-10-27 NOTE — Telephone Encounter (Signed)
Caller ; [place] Call Back # [phone]  Patient states that PCP provider prescribed a stain to her for Cholesterol, patient is requesting alternative medication , patient states back pain when taking medication.

## 2019-10-28 ENCOUNTER — Other Ambulatory Visit: Payer: Self-pay | Admitting: Family Medicine

## 2019-10-28 DIAGNOSIS — E782 Mixed hyperlipidemia: Secondary | ICD-10-CM

## 2019-10-28 MED ORDER — EZETIMIBE 10 MG PO TABS: 10.0000 mg | ORAL_TABLET | Freq: Every day | ORAL | 3 refills | Status: DC

## 2019-11-25 ENCOUNTER — Other Ambulatory Visit: Payer: Self-pay

## 2019-11-25 ENCOUNTER — Ambulatory Visit (INDEPENDENT_AMBULATORY_CARE_PROVIDER_SITE_OTHER): Payer: Commercial Managed Care - PPO | Admitting: Family Medicine

## 2019-11-25 ENCOUNTER — Encounter: Payer: Self-pay | Admitting: Family Medicine

## 2019-11-25 VITALS — BP 126/86 | HR 91 | Temp 98.0°F | Resp 12 | Ht 62.0 in | Wt 162.6 lb

## 2019-11-25 DIAGNOSIS — M62830 Muscle spasm of back: Secondary | ICD-10-CM

## 2019-11-25 MED ORDER — GABAPENTIN 100 MG PO CAPS
100.0000 mg | ORAL_CAPSULE | Freq: Three times a day (TID) | ORAL | 2 refills | Status: AC
Start: 2019-11-25 — End: ?

## 2019-11-25 MED ORDER — KETOROLAC TROMETHAMINE 60 MG/2ML IM SOLN
60.0000 mg | Freq: Once | INTRAMUSCULAR | Status: AC
  Administered 2019-11-25: 60 mg via INTRAMUSCULAR

## 2019-11-25 MED ORDER — BACLOFEN 10 MG PO TABS: 10.0000 mg | ORAL_TABLET | Freq: Three times a day (TID) | ORAL | 5 refills | Status: DC | PRN

## 2019-11-25 NOTE — Progress Notes (Signed)
Musculoskeletal Exam  Patient: Christine Griffith DOB: 01-11-1966  DOS: 11/25/2019  SUBJECTIVE:  Chief Complaint:   Chief Complaint  Patient presents with  . Back Pain    radiating down leg since Saturday    Christine Griffith is a 54 y.o.  female for evaluation and treatment of her back pain.   Onset:  3 days ago. 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  Location: lower Character:  burning  Progression of issue:  has worsened Associated symptoms: radiates down LLE Denies bowel/bladder incontinence or weakness Treatment: to date has been ice, OTC NSAIDS, muscle relaxers and heat.   Neurovascular symptoms: no  Past Medical History:  Diagnosis Date  . Chicken pox   . Environmental allergies   . GERD (gastroesophageal reflux disease)   . Glaucoma (increased eye pressure)    Borderline  . Seasonal allergies   . Vitamin D deficiency     Objective:  VITAL SIGNS: BP 126/86 (BP Location: Left Arm, Cuff Size: Normal)   Pulse 91   Temp 98 F (36.7 C) (Temporal)   Resp 12   Ht 5\' 2"  (1.575 m)   Wt 162 lb 9.6 oz (73.8 kg)   SpO2 98%   BMI 29.74 kg/m  Constitutional: Well formed, well developed. No acute distress. HENT: Normocephalic, atraumatic.  Thorax & Lungs:  No accessory muscle use Skin: Warm. Dry. No erythema. No rash.  Musculoskeletal: low back.   Tenderness to palpation: Yes, tender to palpation over the left thoracolumbar paraspinal musculature and right erector spinae muscle group in the lumbar region Deformity: no Ecchymosis: no Straight leg test: negative for Poor hamstring flexibility b/l. Neurologic: Normal sensory function. No focal deficits noted. DTR's equal and symmetric in LE's. No clonus. Psychiatric: Normal mood. Age appropriate judgment and insight. Alert & oriented x 3.    Assessment:  Muscle spasm of back - Plan: baclofen (LIORESAL) 10 MG tablet  Plan: Refill baclofen, ice, heat, Tylenol, stretches/exercises, Toradol injection  today.  Celebrex resumed tomorrow, letter for work excusing through Friday or sooner if she is feeling better. FMLA- 2x/mo, 3 d per episode F/u in 3 weeks for a lab visit to recheck her cholesterol. The patient voiced understanding and agreement to the plan.   Tuesday Twisp, DO 11/25/19  4:05 PM

## 2019-11-25 NOTE — Patient Instructions (Addendum)
Continue ice and heat.  OK to take Tylenol 1000 mg (2 extra strength tabs) or 975 mg (3 regular strength tabs) every 6 hours as needed.  I think trying Celebrex on an off day. It should not make you drowsy.  Be diligent with your stretches.  Take the gabapentin 1-2 hrs prior to bedtime. If it makes you sleepy, don't take it during the day.   Let us know if you need anything.

## 2019-11-25 NOTE — Addendum Note (Signed)
Addended by: Thelma Barge D on: 11/25/2019 04:50 PM   Modules accepted: Orders

## 2019-12-25 ENCOUNTER — Other Ambulatory Visit (INDEPENDENT_AMBULATORY_CARE_PROVIDER_SITE_OTHER): Payer: Commercial Managed Care - PPO

## 2019-12-25 ENCOUNTER — Other Ambulatory Visit: Payer: Self-pay

## 2019-12-25 DIAGNOSIS — E782 Mixed hyperlipidemia: Secondary | ICD-10-CM | POA: Diagnosis not present

## 2019-12-25 LAB — HEPATIC FUNCTION PANEL
ALT: 16 U/L (ref 0–35)
AST: 13 U/L (ref 0–37)
Albumin: 4 g/dL (ref 3.5–5.2)
Alkaline Phosphatase: 90 U/L (ref 39–117)
Bilirubin, Direct: 0.1 mg/dL (ref 0.0–0.3)
Total Bilirubin: 0.5 mg/dL (ref 0.2–1.2)
Total Protein: 6.8 g/dL (ref 6.0–8.3)

## 2019-12-25 LAB — LIPID PANEL
Cholesterol: 211 mg/dL — ABNORMAL HIGH (ref 0–200)
HDL: 43.9 mg/dL
LDL Cholesterol: 149 mg/dL — ABNORMAL HIGH (ref 0–99)
NonHDL: 166.85
Total CHOL/HDL Ratio: 5
Triglycerides: 89 mg/dL (ref 0.0–149.0)
VLDL: 17.8 mg/dL (ref 0.0–40.0)

## 2019-12-26 ENCOUNTER — Other Ambulatory Visit: Payer: Self-pay

## 2019-12-26 DIAGNOSIS — E782 Mixed hyperlipidemia: Secondary | ICD-10-CM

## 2020-01-16 ENCOUNTER — Ambulatory Visit: Payer: Commercial Managed Care - PPO | Admitting: Family Medicine

## 2020-02-14 IMAGING — CT CT ABD-PELV W/ CM
2 of 5 series · 16 of 46 positions shown, 18 images · IV contrast (APPLIED)
Comparison: 03/10/2010

CLINICAL DATA: Acute abdominal pain, generalized. Patient has had
pain for 6 months, worsening last night. Paying is mid abdominal to
epigastric.

EXAM:
CT ABDOMEN AND PELVIS WITH CONTRAST
TECHNIQUE: Multidetector CT imaging of the abdomen and pelvis was performed
using the standard protocol following bolus administration of
intravenous contrast.
CONTRAST:  100mL C7EO3B-HVV IOPAMIDOL (C7EO3B-HVV) INJECTION 61%

[Series 2: axial st · axial · 0.71mm/px · z∈[-406,-12]mm · 13 of 89 slices shown, 15 images]
[im 5/89  soft-tissue]
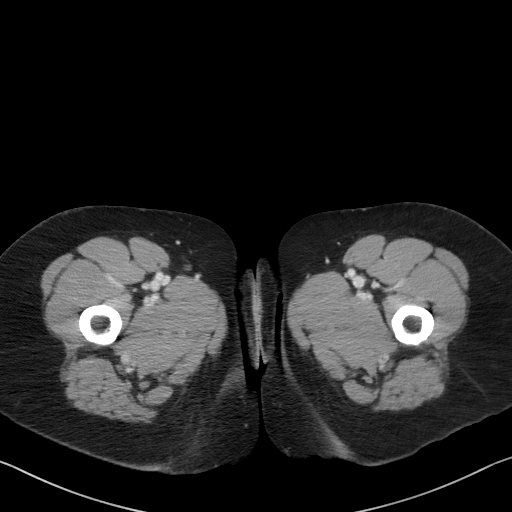
[im 5/89  bone]
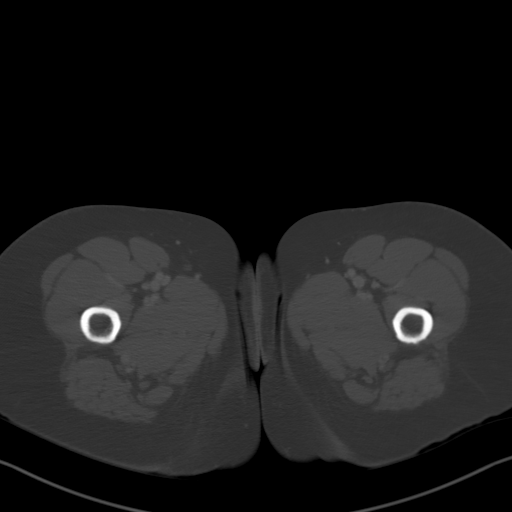
[im 14/89  soft-tissue]
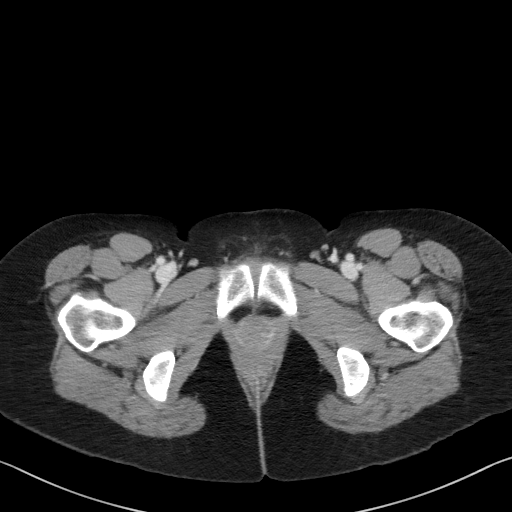
[im 18/89  soft-tissue]
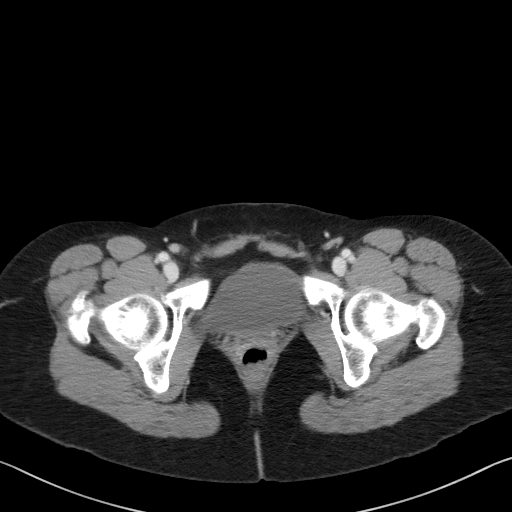
[im 27/89  soft-tissue]
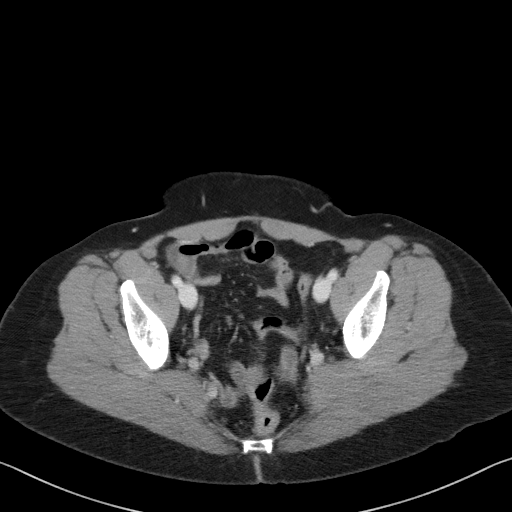
[im 31/89  soft-tissue]
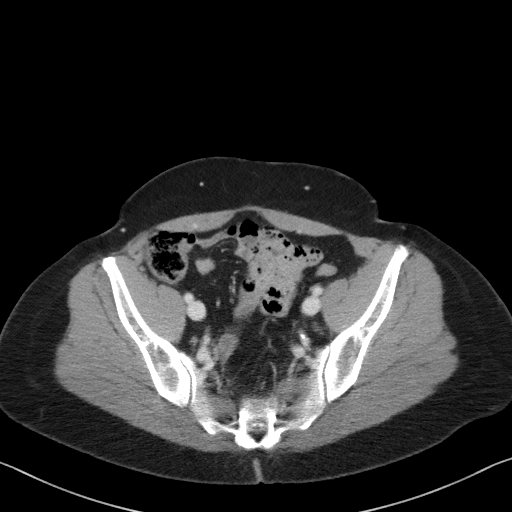
[im 40/89  soft-tissue]
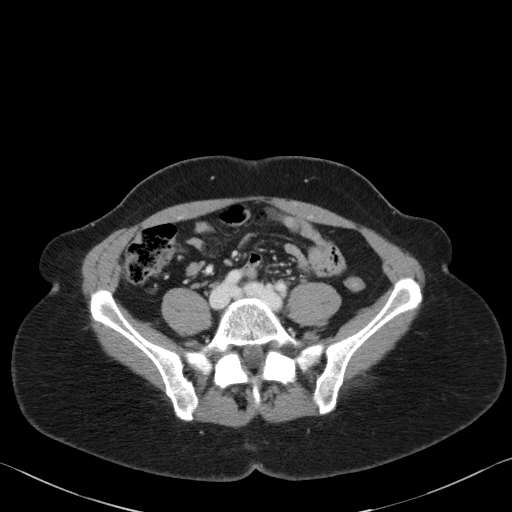
[im 45/89  soft-tissue]
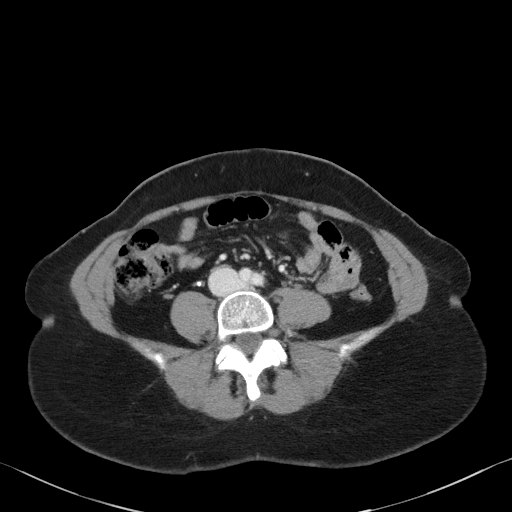
[im 49/89  soft-tissue]
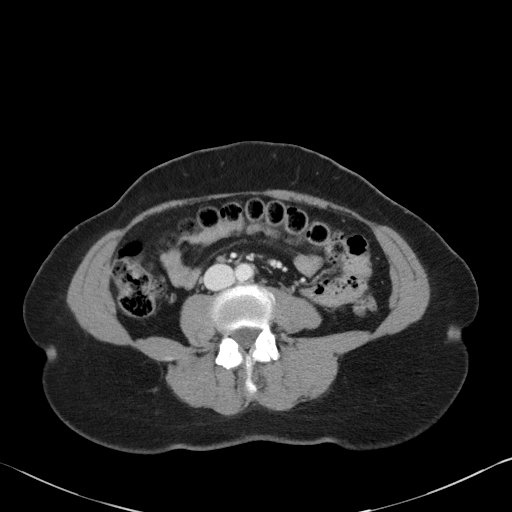
[im 58/89  soft-tissue]
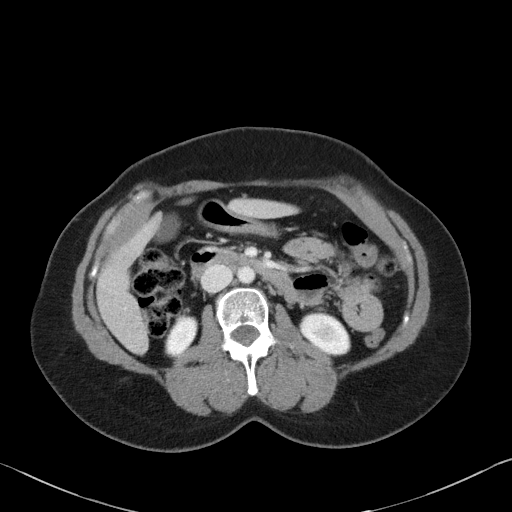
[im 58/89  bone]
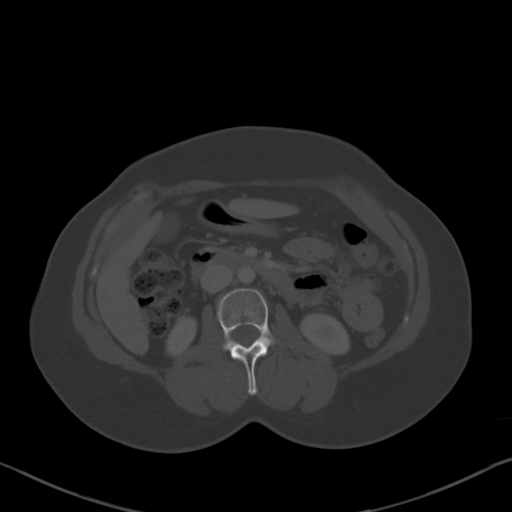
[im 62/89  soft-tissue]
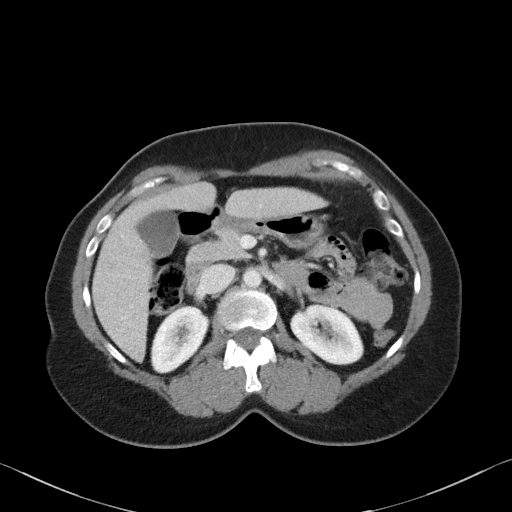
[im 71/89  soft-tissue]
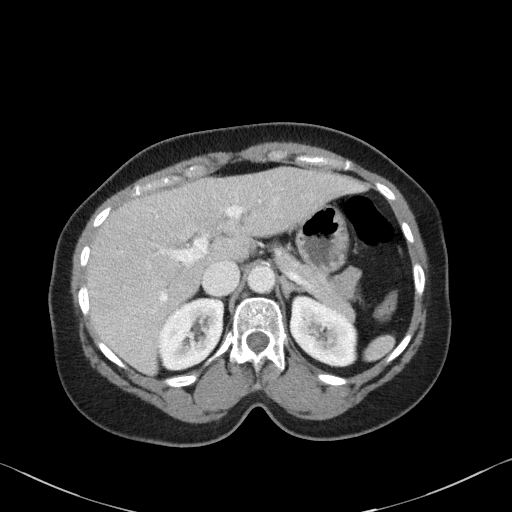
[im 75/89  soft-tissue]
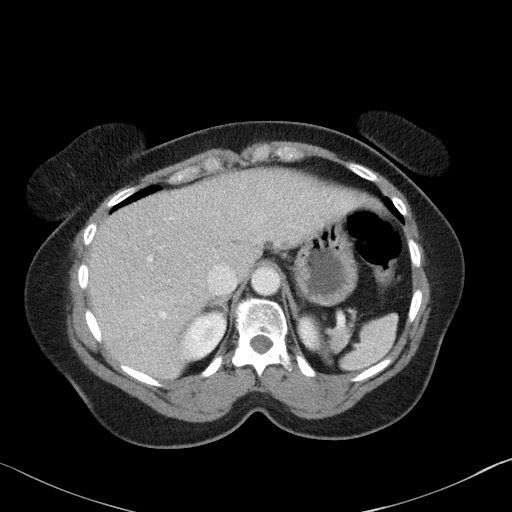
[im 84/89  soft-tissue]
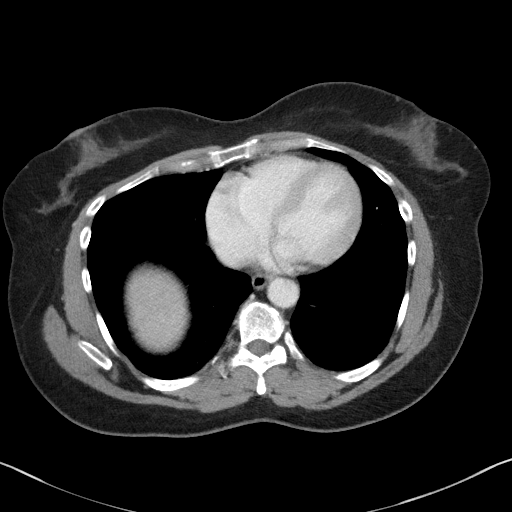

[Series 5: coronal st · coronal · 0.70mm/px · 3 of 81 slices shown]
[im 27/81  soft-tissue]
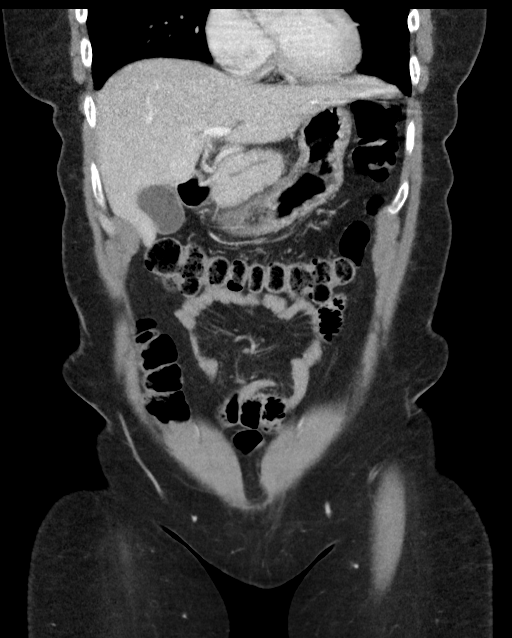
[im 36/81  soft-tissue]
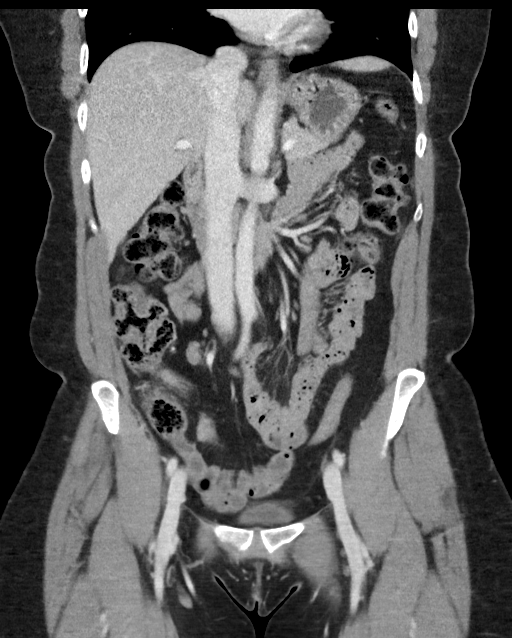
[im 45/81  soft-tissue]
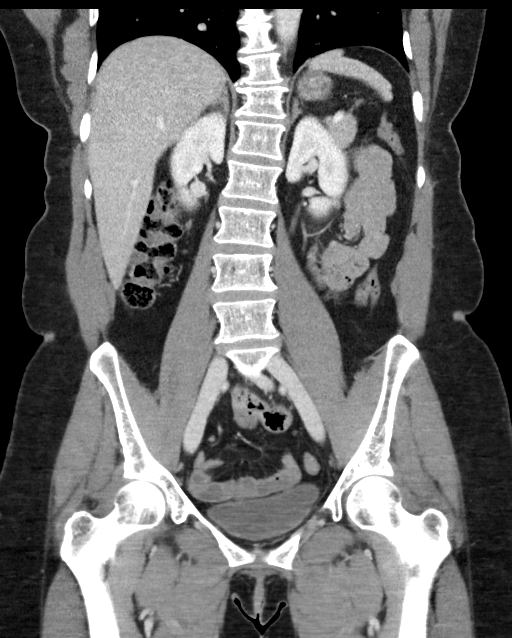

[16 of 46 positions shown; findings below may reference images not displayed]

FINDINGS: Lower chest:  Negative

Hepatobiliary: Multiple small hepatic cysts. Single 4 mm
hypervascular focus in the upper right liver which could be a small
shunt or flash fill hemangioma. No history of chronic liver disease
or malignancy. No evidence of biliary obstruction or stone.

Pancreas: Unremarkable.

Spleen: Unremarkable.

Adrenals/Urinary Tract: Negative adrenals. No hydronephrosis or
stone. Unremarkable bladder.

Stomach/Bowel:  No obstruction. No appendicitis.

Vascular/Lymphatic: No acute vascular abnormality. Mild atheromatous
wall thickening of the aorta. No mass or adenopathy.

Reproductive:Hysterectomy.  Negative adnexae.

Other: No ascites or pneumoperitoneum.

Musculoskeletal: No acute abnormalities. Mild dextroscoliosis, also
seen previously.
IMPRESSION: 1. No acute finding or explanation for abdominal pain.
2. Mild aortic atherosclerosis.

## 2020-03-12 ENCOUNTER — Ambulatory Visit: Payer: Commercial Managed Care - PPO | Admitting: Family Medicine

## 2020-03-12 ENCOUNTER — Encounter: Payer: Self-pay | Admitting: Family Medicine

## 2020-03-12 VITALS — BP 130/82 | HR 76 | Temp 97.6°F | Ht 62.0 in | Wt 159.1 lb

## 2020-03-12 DIAGNOSIS — S86812A Strain of other muscle(s) and tendon(s) at lower leg level, left leg, initial encounter: Secondary | ICD-10-CM

## 2020-03-12 DIAGNOSIS — M546 Pain in thoracic spine: Secondary | ICD-10-CM

## 2020-03-12 DIAGNOSIS — L84 Corns and callosities: Secondary | ICD-10-CM | POA: Diagnosis not present

## 2020-03-12 DIAGNOSIS — E782 Mixed hyperlipidemia: Secondary | ICD-10-CM

## 2020-03-12 DIAGNOSIS — G8929 Other chronic pain: Secondary | ICD-10-CM

## 2020-03-12 LAB — HEPATIC FUNCTION PANEL
AG Ratio: 1.4 (calc) (ref 1.0–2.5)
ALT: 15 U/L (ref 6–29)
AST: 13 U/L (ref 10–35)
Albumin: 4.2 g/dL (ref 3.6–5.1)
Alkaline phosphatase (APISO): 93 U/L (ref 37–153)
Bilirubin, Direct: 0.1 mg/dL (ref 0.0–0.2)
Globulin: 2.9 g/dL (calc) (ref 1.9–3.7)
Indirect Bilirubin: 0.3 mg/dL (calc) (ref 0.2–1.2)
Total Bilirubin: 0.4 mg/dL (ref 0.2–1.2)
Total Protein: 7.1 g/dL (ref 6.1–8.1)

## 2020-03-12 LAB — LIPID PANEL
Cholesterol: 199 mg/dL (ref <200)
HDL: 47 mg/dL — ABNORMAL LOW (ref >=50)
LDL Cholesterol (Calc): 132 mg/dL (calc) — ABNORMAL HIGH
Non-HDL Cholesterol (Calc): 152 mg/dL (calc) — ABNORMAL HIGH (ref <130)
Total CHOL/HDL Ratio: 4.2 (calc) (ref <5.0)
Triglycerides: 94 mg/dL (ref <150)

## 2020-03-12 NOTE — Addendum Note (Signed)
Addended by: Mervin Kung A on: 03/12/2020 03:20 PM   Modules accepted: Orders

## 2020-03-12 NOTE — Patient Instructions (Addendum)
If you do not hear anything about your referral in the next 1-2 weeks, call our office and ask for an update.  Heat (pad or rice pillow in microwave) over affected area, 10-15 minutes twice daily.   Try to do yoga. Consider focusing on core yoga.  Try metatarsal pads for your feet. Continue to shave them down, but not too deep.   The new Shingrix vaccine (for shingles) is a 2 shot series. It can make people feel low energy, achy and almost like they have the flu for 48 hours after injection. Please plan accordingly when deciding on when to get this shot. Call our office for a nurse visit appointment to get this. The second shot of the series is less severe regarding the side effects, but it still lasts 48 hours.   Let us know if you need anything.  Ankle Exercises It is normal to feel mild stretching, pulling, tightness, or discomfort as you do these exercises, but you should stop right away if you feel sudden pain or your pain gets worse.  Stretching and range of motion exercises These exercises warm up your muscles and joints and improve the movement and flexibility of your ankle. These exercises also help to relieve pain, numbness, and tingling. Exercise A: Dorsiflexion/Plantar Flexion   1. Sit with your affected knee straight or bent. Do not rest your foot on anything. 2. Flex your affected ankle to tilt the top of your foot toward your shin. 3. Hold this position for 5 seconds. 4. Point your toes downward to tilt the top of your foot away from your shin. 5. Hold this position for 5 seconds. Repeat 2 times. Complete this exercise 3 times per week. Exercise B: Ankle Alphabet   1. Sit with your affected foot supported at your lower leg. ? Do not rest your foot on anything. ? Make sure your foot has room to move freely. 2. Think of your affected foot as a paintbrush, and move your foot to trace each letter of the alphabet in the air. Keep your hip and knee still while you trace.  Make the letters as large as you can without increasing any discomfort. 3. Trace every letter from A to Z. Repeat 2 times. Complete this exercise 3 times per week. Strengthening exercises These exercises build strength and endurance in your ankle. Endurance is the ability to use your muscles for a long time, even after they get tired. Exercise D: Dorsiflexors   1. Secure a rubber exercise band or tube to an object, such as a table leg, that will stay still when the band is pulled. Secure the other end around your affected foot. 2. Sit on the floor, facing the object with your affected leg extended. The band or tube should be slightly tense when your foot is relaxed. 3. Slowly flex your affected ankle and toes to bring your foot toward you. 4. Hold this position for 3 seconds.  5. Slowly return your foot to the starting position, controlling the band as you do that. Do a total of 10 repetitions. Repeat 2 times. Complete this exercise 3 times per week. Exercise E: Plantar Flexors   1. Sit on the floor with your affected leg extended. 2. Loop a rubber exercise band or tube around the ball of your affected foot. The ball of your foot is on the walking surface, right under your toes. The band or tube should be slightly tense when your foot is relaxed. 3. Slowly point your toes  downward, pushing them away from you. 4. Hold this position for 3 seconds. 5. Slowly release the tension in the band or tube, controlling smoothly until your foot is back in the starting position. Repeat for a total of 10 repetitions. Repeat 2 times. Complete this exercise 3 times per week. Exercise F: Towel Curls   1. Sit in a chair on a non-carpeted surface, and put your feet on the floor. 2. Place a towel in front of your feet.  3. Keeping your heel on the floor, put your affected foot on the towel. 4. Pull the towel toward you by grabbing the towel with your toes and curling them under. Keep your heel on the  floor. 5. Let your toes relax. 6. Grab the towel again. Keep going until the towel is completely underneath your foot. Repeat for a total of 10 repetitions. Repeat 2 times. Complete this exercise 3 times per week. Exercise G: Heel Raise ( Plantar Flexors, Standing)    1. Stand with your feet shoulder-width apart. 2. Keep your weight spread evenly over the width of your feet while you rise up on your toes. Use a wall or table to steady yourself, but try not to use it for support. 3. If this exercise is too easy, try these options: ? Shift your weight toward your affected leg until you feel challenged. ? If told by your health care provider, lift your uninjured leg off the floor. 4. Hold this position for 3 seconds. Repeat for a total of 10 repetitions. Repeat 2 times. Complete this exercise 3 times per week. Exercise H: Tandem Walking 1. Stand with one foot directly in front of the other. 2. Slowly raise your back foot up, lifting your heel before your toes, and place it directly in front of your other foot. 3. Continue to walk in this heel-to-toe way for 10 steps or for as long as told by your health care provider. Have a countertop or wall nearby to use if needed to keep your balance, but try not to hold onto anything for support. Repeat 2 times. Complete this exercises 3 times per week. Make sure you discuss any questions you have with your health care provider. Document Released: 04/05/2005 Document Revised: 01/20/2016 Document Reviewed: 02/07/2015 Elsevier Interactive Patient Education  2018 [employer].

## 2020-03-12 NOTE — Progress Notes (Signed)
Chief Complaint  Patient presents with  . Edema    just the left foot  . Foot Pain    both feet    Subjective: Patient is a 54 y.o. female here for f/u.  Patient continues to have right-sided pain.  The gastroenterology team does not believe it is related to their field.  No bruising, swelling, or redness.  It will still spasm intermittently.  She sees the pain clinic and injection helped for short period of time.  She is wondering what the next steps are.  Patient has had callus formation on the balls of both of her feet.  No new shoes or injury.  She tries to pare them down but usually has pain by the time they are smooth.  She has not tried any thing so far.  No drainage or redness.  The patient also has a several week history of anterior left ankle pain.  No injury or change in activity.  Anti-inflammatories and ice have been somewhat helpful.  No redness, swelling, bruising, or neurologic symptoms.  Past Medical History:  Diagnosis Date  . Chicken pox   . Environmental allergies   . GERD (gastroesophageal reflux disease)   . Glaucoma (increased eye pressure)    Borderline  . Seasonal allergies   . Vitamin D deficiency     Objective: BP 130/82 (BP Location: Left Arm, Patient Position: Sitting, Cuff Size: Normal)   Pulse 76   Temp 97.6 F (36.4 C) (Oral)   Ht 5\' 2"  (1.575 m)   Wt 159 lb 2 oz (72.2 kg)   SpO2 99%   BMI 29.10 kg/m  General: Awake, appears stated age HEENT: MMM, EOMi Heart: DP pulses 2+ bilaterally Neuro: Gait is largely normal. MSK: Tender to palpation over the distal TA on the L over anterior ankle Skin: Callus formation over the lateral distal forefoot and medial distal forefoot bilaterally; petechia noted medially bilaterally; some hyperkeratinization noted as well Lungs: CTAB, no rales, wheezes or rhonchi. No accessory muscle use Psych: Age appropriate judgment and insight, normal affect and mood  Assessment and Plan: Strain of tibialis anterior  muscle, left, initial encounter  Callus of foot  Chronic right-sided thoracic back pain - Plan: Ambulatory referral to Sports Medicine  Mixed hyperlipidemia - Plan: Lipid panel, Hepatic function panel  1.  Stretches and exercises, heat, ice, Tylenol 2..  On the foot.  Metatarsal pads recommended.  Follow-up with podiatry if no improvement 3.  Refer to sports medicine.  Stretch area.  Heat. The patient voiced understanding and agreement to the plan.  [place], DO 03/12/20  4:59 PM

## 2020-03-15 ENCOUNTER — Ambulatory Visit: Payer: Commercial Managed Care - PPO | Admitting: Family Medicine

## 2020-05-04 IMAGING — MG DIGITAL SCREENING BILATERAL MAMMOGRAM WITH TOMO AND CAD
8 series · 9 of 24 positions shown · non-contrast
Comparison: Previous exam(s).

CLINICAL DATA: Screening.

EXAM:
DIGITAL SCREENING BILATERAL MAMMOGRAM WITH TOMO AND CAD

[L CC synth-2D]
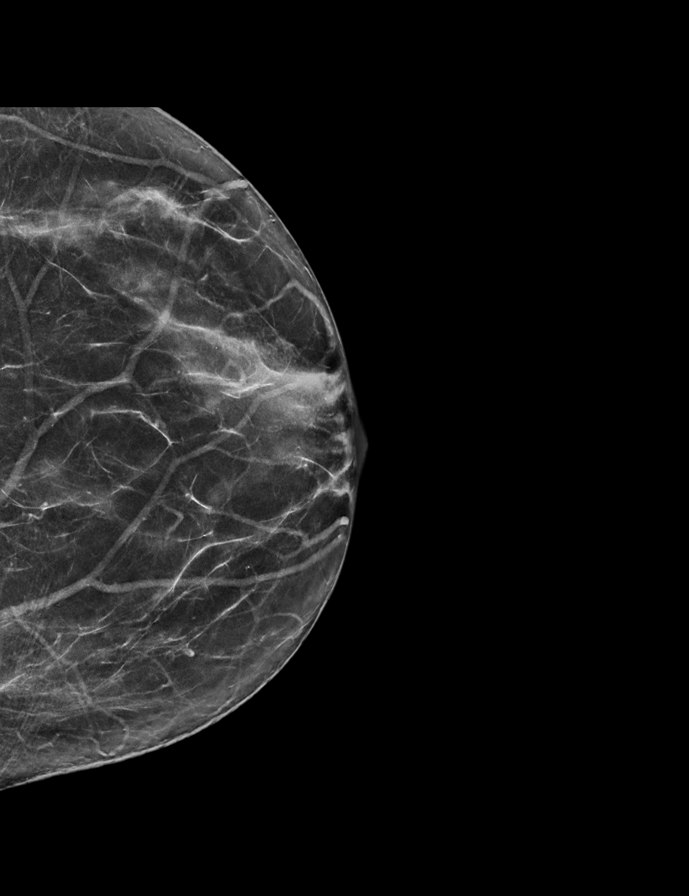

[L MLO synth-2D]
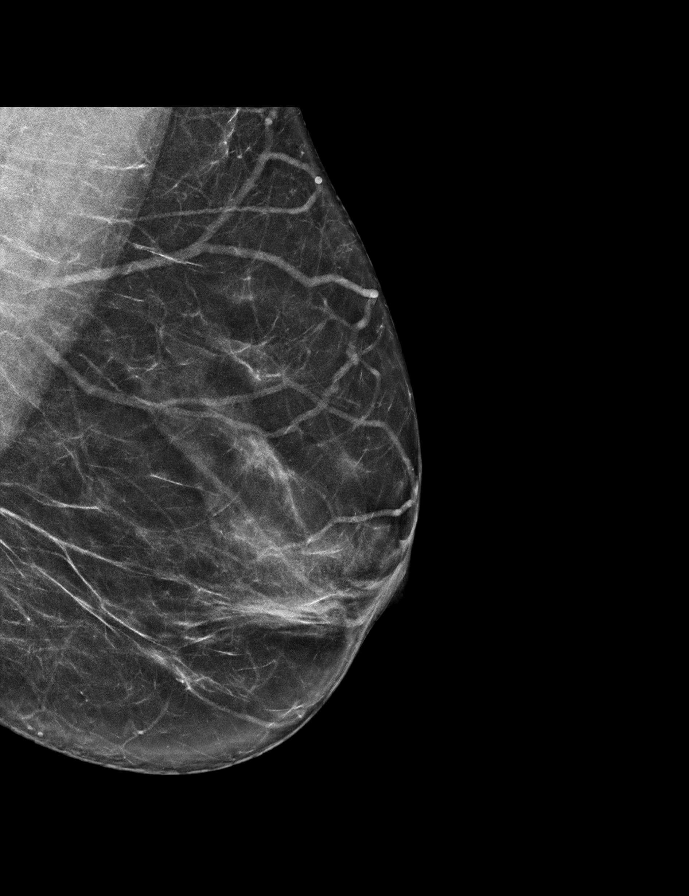

[R CC synth-2D]
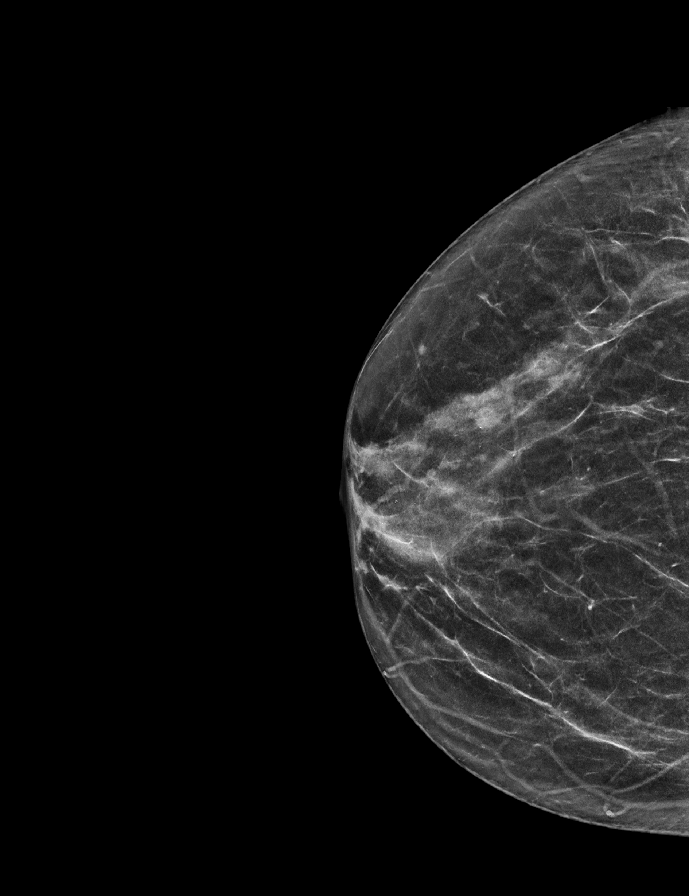

[R MLO synth-2D]
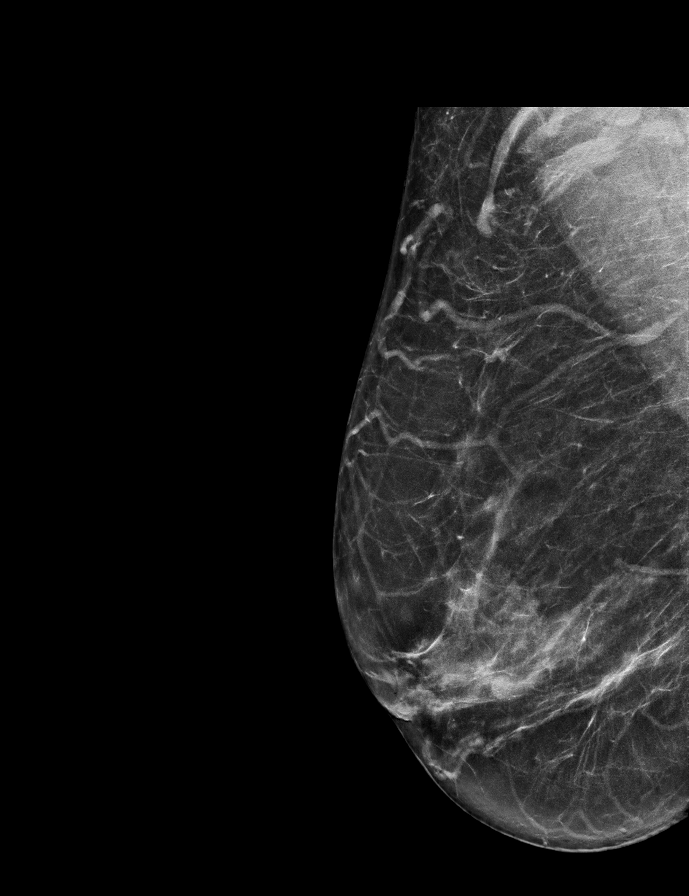

[L CC tomo · 2 of 61 frames shown]
[frame 20/61]
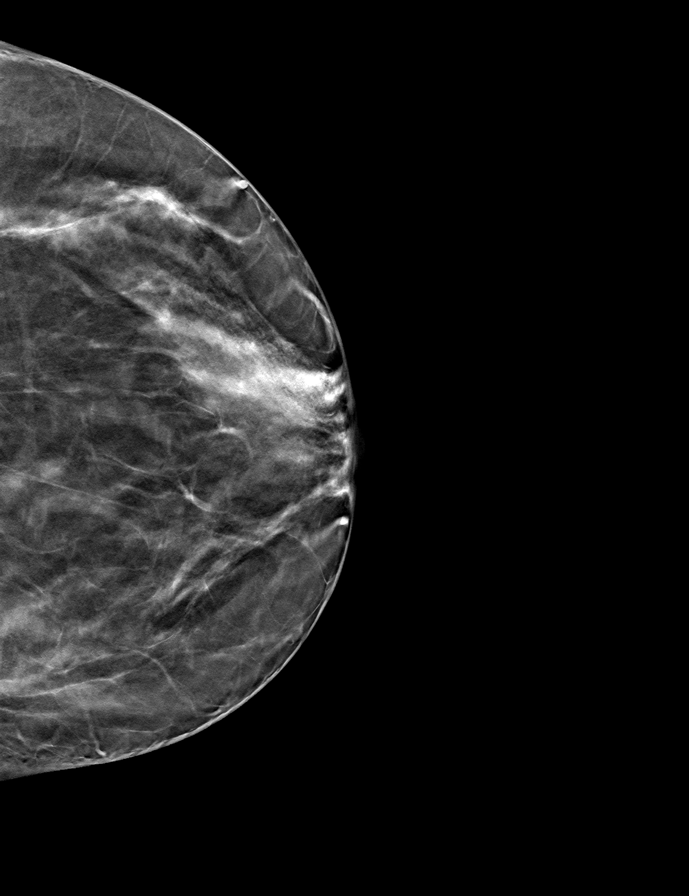
[frame 31/61]
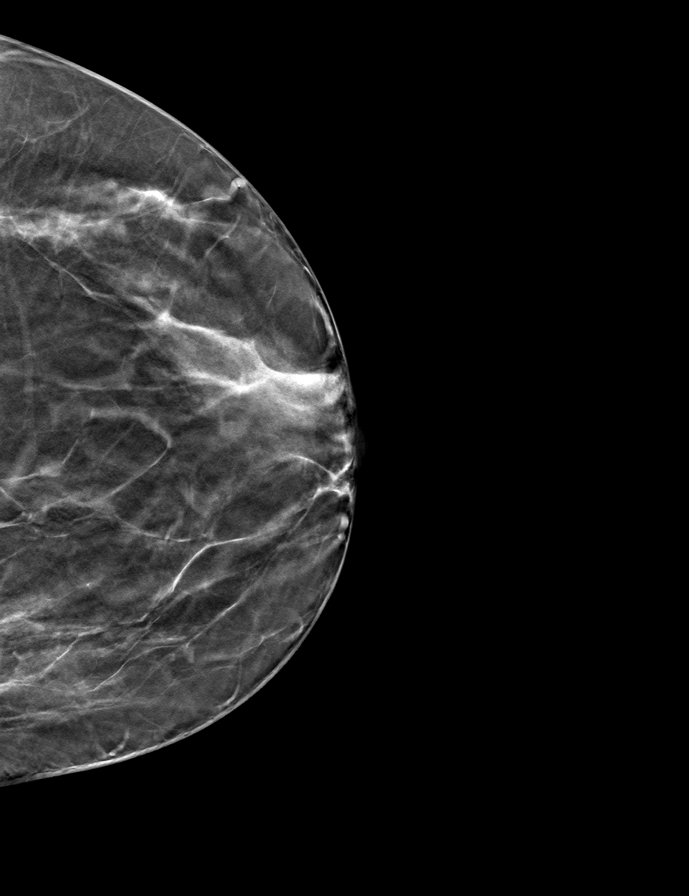

[R MLO tomo · tomo slice 35/69.0]
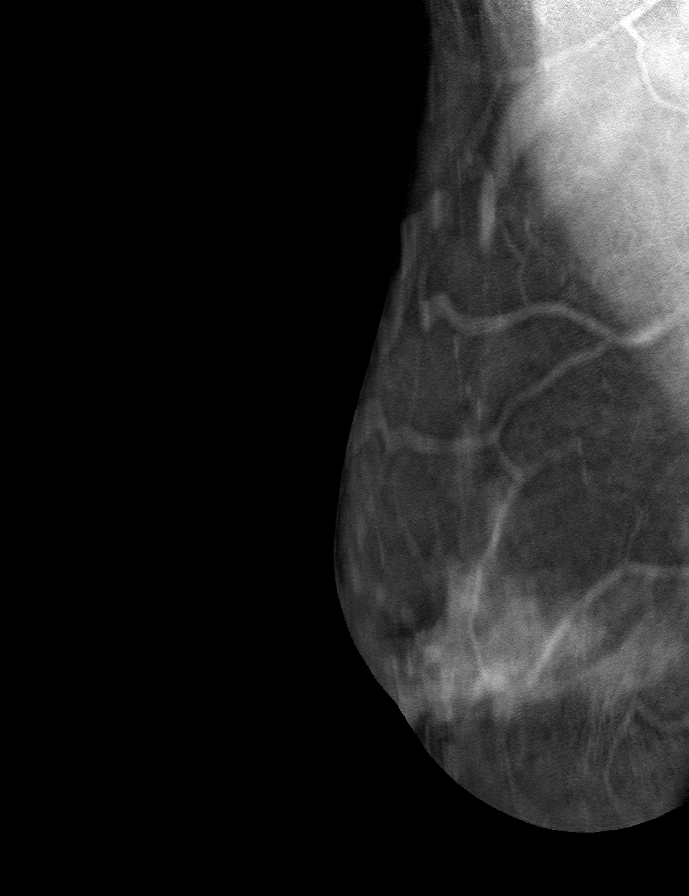

[L MLO tomo · tomo slice 36/71.0]
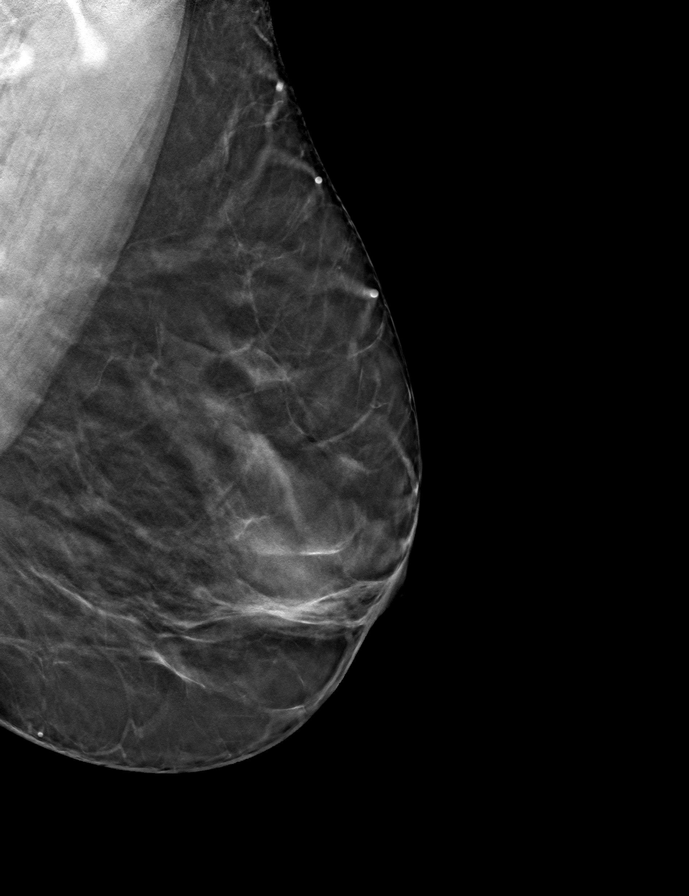

[R CC tomo · tomo slice 29/57.0]
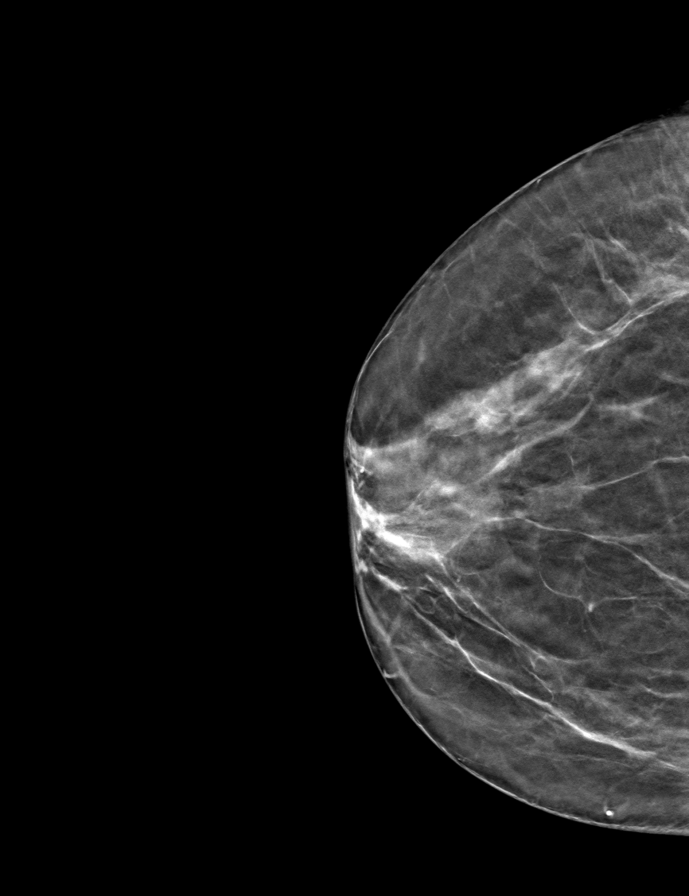

[9 of 24 positions shown; findings below may reference images not displayed]

ACR Breast Density Category b: There are scattered areas of
fibroglandular density.
FINDINGS: There are no findings suspicious for malignancy. Images were
processed with CAD.
IMPRESSION: No mammographic evidence of malignancy. A result letter of this
screening mammogram will be mailed directly to the patient.

RECOMMENDATION:
Screening mammogram in one year. (Code:[id number]-775)

BI-RADS CATEGORY  1: Negative.

## 2020-08-16 ENCOUNTER — Other Ambulatory Visit: Payer: Self-pay | Admitting: Family Medicine

## 2020-08-16 DIAGNOSIS — G8929 Other chronic pain: Secondary | ICD-10-CM

## 2020-08-16 DIAGNOSIS — M62830 Muscle spasm of back: Secondary | ICD-10-CM

## 2020-09-23 ENCOUNTER — Ambulatory Visit: Payer: Commercial Managed Care - PPO | Attending: Internal Medicine

## 2020-09-23 DIAGNOSIS — Z23 Encounter for immunization: Secondary | ICD-10-CM

## 2020-09-23 NOTE — Progress Notes (Signed)
   Covid-19 Vaccination Clinic  Name:  Christine Griffith    MRN: [id number] DOB: August 17, 1965  09/23/2020  [name] was observed post Covid-19 immunization for 15 minutes without incident. She was provided with Vaccine Information Sheet and instruction to access the V-Safe system.   [name] was instructed to call 911 with any severe reactions post vaccine: Marland Kitchen Difficulty breathing  . Swelling of face and throat  . A fast heartbeat  . A bad rash all over body  . Dizziness and weakness   Immunizations Administered    Name Date Dose VIS Date Route   PFIZER Comrnaty(Gray TOP) Covid-19 Vaccine 09/23/2020 12:51 PM 0.3 mL 05/13/2020 Intramuscular   Manufacturer: [employer], [employer]   Lot: [id number]   NDC: [phone]

## 2020-09-27 ENCOUNTER — Other Ambulatory Visit (HOSPITAL_BASED_OUTPATIENT_CLINIC_OR_DEPARTMENT_OTHER): Payer: Self-pay

## 2020-09-27 MED ORDER — PFIZER-BIONT COVID-19 VAC-TRIS 30 MCG/0.3ML IM SUSP
INTRAMUSCULAR | 0 refills | Status: DC
Start: 2020-09-23 — End: 2023-02-01
  Filled 2020-09-27: qty 0.3, 1d supply, fill #0

## 2020-11-08 ENCOUNTER — Encounter: Payer: Self-pay | Admitting: Family Medicine

## 2020-11-08 ENCOUNTER — Ambulatory Visit: Payer: Commercial Managed Care - PPO | Admitting: Family Medicine

## 2020-11-08 ENCOUNTER — Other Ambulatory Visit: Payer: Self-pay

## 2020-11-08 VITALS — BP 120/82 | HR 78 | Temp 97.9°F | Ht 62.0 in | Wt 156.0 lb

## 2020-11-08 DIAGNOSIS — E785 Hyperlipidemia, unspecified: Secondary | ICD-10-CM | POA: Diagnosis not present

## 2020-11-08 DIAGNOSIS — R5383 Other fatigue: Secondary | ICD-10-CM

## 2020-11-08 LAB — CBC
HCT: 36.6 % (ref 36.0–46.0)
Hemoglobin: 11.9 g/dL — ABNORMAL LOW (ref 12.0–15.0)
MCHC: 32.6 g/dL (ref 30.0–36.0)
MCV: 87.9 fl (ref 78.0–100.0)
Platelets: 392 10*3/uL (ref 150.0–400.0)
RBC: 4.16 Mil/uL (ref 3.87–5.11)
RDW: 14.2 % (ref 11.5–15.5)
WBC: 6.8 10*3/uL (ref 4.0–10.5)

## 2020-11-08 LAB — VITAMIN B12: Vitamin B-12: 520 pg/mL (ref 211–911)

## 2020-11-08 LAB — VITAMIN D 25 HYDROXY (VIT D DEFICIENCY, FRACTURES): VITD: 20.08 ng/mL — ABNORMAL LOW (ref 30.00–100.00)

## 2020-11-08 NOTE — Patient Instructions (Addendum)
Give [place] 2-3 business days to get the results of your labs back. If things are normal, we will send in an anti-depressant.   Keep the diet clean and stay active.  Aim to do some physical exertion for 150 minutes per week. This is typically divided into 5 days per week, 30 minutes per day. The activity should be enough to get your heart rate up. Anything is better than nothing if you have time constraints.  Please consider counseling. Contact [phone] to schedule an appointment or inquire about cost/insurance coverage.  Please call Dr. Katrinka Blazing for an appointment. A referral was already placed. [phone]  Let us know if you need anything.

## 2020-11-08 NOTE — Progress Notes (Signed)
Chief Complaint  Patient presents with  . Insect Bite    Christine Griffith is a 55 y.o. female here for a skin complaint.  Duration: 2.5 weeks Location: back Pruritic? Yes Painful? No Drainage? No Sick contacts? No Other associated symptoms: fatigue Therapies tried thus far: none  Fatigue for around 1 mo, tough to put a time frame. She sleeps 4-5 hrs per night, 7-8 hrs on days she is off. She is very sleepy during the day and could fall asleep at a moment's notice. She does not snore at night. Feels relatively well rested. Diet could be better. She is not exercising as much. No anxiety, mild depressive symptoms. She has not gained weight unexpectedly.   Past Medical History:  Diagnosis Date  . Chicken pox   . Environmental allergies   . GERD (gastroesophageal reflux disease)   . Glaucoma (increased eye pressure)    Borderline  . Seasonal allergies   . Vitamin D deficiency     BP 120/82 (BP Location: Left Arm, Patient Position: Sitting, Cuff Size: Normal)   Pulse 78   Temp 97.9 F (36.6 C) (Oral)   Ht 5\' 2"  (1.575 m)   Wt 156 lb (70.8 kg)   SpO2 99%   BMI 28.53 kg/m  Gen: awake, alert, appearing stated age Heart: RRR Neck: Supple, symmetric, no nodules noted Lungs: CTAB. No accessory muscle use Skin: Hyperpigmented papule with central hypopigmentation measuring 0.5 cm in diameter. No drainage, erythema, TTP, fluctuance, excoriation Psych: Age appropriate judgment and insight  Fatigue, unspecified type - Plan: CBC, Comprehensive metabolic panel, VITAMIN D 25 Hydroxy (Vit-D Deficiency, Fractures), B12  Hyperlipidemia, unspecified hyperlipidemia type - Plan: Lipid panel  1. Ck labs, could be combo of less than ideal diet/exercise, dysthymia, lack of sleep. If neg, will send in SSRI and reck in 6 weeks.  2. Ck labs.  F/u prn. The patient voiced understanding and agreement to the plan.  [place], DO 11/08/20 11:42 AM

## 2020-11-09 ENCOUNTER — Other Ambulatory Visit: Payer: Self-pay | Admitting: Family Medicine

## 2020-11-09 ENCOUNTER — Other Ambulatory Visit (INDEPENDENT_AMBULATORY_CARE_PROVIDER_SITE_OTHER): Payer: Commercial Managed Care - PPO

## 2020-11-09 DIAGNOSIS — D649 Anemia, unspecified: Secondary | ICD-10-CM | POA: Diagnosis not present

## 2020-11-09 DIAGNOSIS — E559 Vitamin D deficiency, unspecified: Secondary | ICD-10-CM

## 2020-11-09 LAB — COMPREHENSIVE METABOLIC PANEL
ALT: 15 U/L (ref 0–35)
AST: 12 U/L (ref 0–37)
Albumin: 4.2 g/dL (ref 3.5–5.2)
Alkaline Phosphatase: 90 U/L (ref 39–117)
BUN: 13 mg/dL (ref 6–23)
CO2: 25 mEq/L (ref 19–32)
Calcium: 8.8 mg/dL (ref 8.4–10.5)
Chloride: 104 mEq/L (ref 96–112)
Creatinine, Ser: 0.89 mg/dL (ref 0.40–1.20)
GFR: 73.75 mL/min (ref >60.00)
Glucose, Bld: 90 mg/dL (ref 70–99)
Potassium: 4.1 mEq/L (ref 3.5–5.1)
Sodium: 139 mEq/L (ref 135–145)
Total Bilirubin: 0.5 mg/dL (ref 0.2–1.2)
Total Protein: 6.9 g/dL (ref 6.0–8.3)

## 2020-11-09 LAB — IBC + FERRITIN
Ferritin: 51.8 ng/mL (ref 10.0–291.0)
Iron: 31 ug/dL — ABNORMAL LOW (ref 42–145)
Saturation Ratios: 10 % — ABNORMAL LOW (ref 20.0–50.0)
Transferrin: 222 mg/dL (ref 212.0–360.0)

## 2020-11-09 LAB — LIPID PANEL
Cholesterol: 199 mg/dL (ref 0–200)
HDL: 47.2 mg/dL (ref >39.00)
LDL Cholesterol: 138 mg/dL — ABNORMAL HIGH (ref 0–99)
NonHDL: 151.94
Total CHOL/HDL Ratio: 4
Triglycerides: 72 mg/dL (ref 0.0–149.0)
VLDL: 14.4 mg/dL (ref 0.0–40.0)

## 2020-11-10 ENCOUNTER — Other Ambulatory Visit: Payer: Self-pay | Admitting: Family Medicine

## 2020-11-10 DIAGNOSIS — K625 Hemorrhage of anus and rectum: Secondary | ICD-10-CM

## 2020-11-10 DIAGNOSIS — E611 Iron deficiency: Secondary | ICD-10-CM

## 2020-11-10 MED ORDER — VITAMIN D (ERGOCALCIFEROL) 1.25 MG (50000 UNIT) PO CAPS: 50000.0000 [IU] | ORAL_CAPSULE | ORAL | 0 refills | Status: DC

## 2020-11-10 NOTE — Progress Notes (Signed)
Urine microscopy

## 2020-11-18 ENCOUNTER — Other Ambulatory Visit: Payer: Self-pay

## 2020-11-18 ENCOUNTER — Other Ambulatory Visit (INDEPENDENT_AMBULATORY_CARE_PROVIDER_SITE_OTHER): Payer: Commercial Managed Care - PPO

## 2020-11-18 DIAGNOSIS — E611 Iron deficiency: Secondary | ICD-10-CM | POA: Diagnosis not present

## 2020-11-18 LAB — URINALYSIS, MICROSCOPIC ONLY
RBC / HPF: NONE SEEN (ref 0–?)
WBC, UA: NONE SEEN (ref 0–?)

## 2020-12-08 ENCOUNTER — Other Ambulatory Visit: Payer: Self-pay

## 2020-12-08 ENCOUNTER — Ambulatory Visit: Payer: Commercial Managed Care - PPO | Admitting: Family

## 2020-12-08 VITALS — BP 122/81 | HR 83 | Temp 97.8°F | Resp 16 | Wt 155.0 lb

## 2020-12-08 DIAGNOSIS — R82998 Other abnormal findings in urine: Secondary | ICD-10-CM | POA: Diagnosis not present

## 2020-12-08 DIAGNOSIS — L237 Allergic contact dermatitis due to plants, except food: Secondary | ICD-10-CM | POA: Diagnosis not present

## 2020-12-08 DIAGNOSIS — R809 Proteinuria, unspecified: Secondary | ICD-10-CM | POA: Diagnosis not present

## 2020-12-08 LAB — POC URINALSYSI DIPSTICK (AUTOMATED)
Bilirubin, UA: NEGATIVE
Glucose, UA: NEGATIVE
Ketones, UA: NEGATIVE
Nitrite, UA: NEGATIVE
Protein, UA: POSITIVE — AB
Spec Grav, UA: >=1.03 — AB (ref 1.010–1.025)
Urobilinogen, UA: NEGATIVE E.U./dL — AB
pH, UA: 6 (ref 5.0–8.0)

## 2020-12-08 MED ORDER — PREDNISONE 10 MG PO TABS: 20.0000 mg | ORAL_TABLET | Freq: Every day | ORAL | 0 refills | Status: AC

## 2020-12-08 MED ORDER — METHYLPREDNISOLONE SODIUM SUCC 125 MG IJ SOLR
125.0000 mg | Freq: Once | INTRAMUSCULAR | Status: AC
  Administered 2020-12-08: 125 mg via INTRAMUSCULAR

## 2020-12-08 MED ORDER — SODIUM CHLORIDE 0.9 % IV SOLN: 125.0000 mg | Freq: Once | INTRAVENOUS | Status: DC

## 2020-12-08 NOTE — Patient Instructions (Signed)
For itching, you may use claritin 10mg  once daily. You may take benadryl in the evenings as needed.   Start prednisone tomorrow.  Call if symptoms worsen or if not improved in 3-4 days.

## 2020-12-08 NOTE — Assessment & Plan Note (Addendum)
New.  Solumedrol 125mg  IM today in the office. This will be followed by prednisone 20mg  once daily for 5 days. She states she can handle lower doses of prednisone but higher doses can cause her nausea.  Patient is advised as follows:   For itching, you may use claritin 10mg  once daily. You may take benadryl in the evenings as needed.   Start prednisone tomorrow.  Call if symptoms worsen or if not improved in 3-4 days.

## 2020-12-08 NOTE — Progress Notes (Signed)
s  Subjective:     Patient ID: Christine Griffith, female    DOB: 1966-05-14, 55 y.o.   MRN: [id number]  Chief Complaint  Patient presents with   [facility]    Here for skin itching from poison ivy, "all over body"    Poison Ivy   Pt presents today with chief complaint of rash which began after she had been working in her yard.  Rash started on Friday 12/03/20, worsened on Sunday. Rash is associated with pruritis. Rash is improved by- clorox baths, allergy medicine, wipe down with alcohol.   She also noted some dark urine/flank pain last week and is requesting UA.    Health Maintenance Due  Topic Date Due   Zoster Vaccines- Shingrix (1 of 2) Never done   PAP SMEAR-Modifier  12/03/2020    Past Medical History:  Diagnosis Date   Chicken pox    Environmental allergies    GERD (gastroesophageal reflux disease)    Glaucoma (increased eye pressure)    Borderline   Seasonal allergies    Vitamin D deficiency     Past Surgical History:  Procedure Laterality Date   ABDOMINAL HYSTERECTOMY  2014   BREAST BIOPSY Right    stereo   CESAREAN SECTION     WISDOM TOOTH EXTRACTION      Family History  Problem Relation Age of Onset   Hypertension Mother        Living   Stroke Mother        multiple   COPD Mother    Diabetes Mother    COPD Father 56       Deceased   Bone cancer Paternal Grandmother    Glaucoma Maternal Aunt    Hypertension Brother        x3   Heart disease Brother        x1   Asthma Son    Allergies Son    Eczema Son        x1   Eczema Daughter        x3    Social History   Socioeconomic History   Marital status: Legally Separated    Spouse name: Not on file   Number of children: Not on file   Years of education: Not on file   Highest education level: Not on file  Occupational History   Not on file  Tobacco Use   Smoking status: Never   Smokeless tobacco: Never  Vaping Use   Vaping Use: Never used  Substance and Sexual Activity   Alcohol  use: Yes    Comment: rare   Drug use: No   Sexual activity: Not on file  Other Topics Concern   Not on file  Social History Narrative   Not on file   Social Determinants of Health   Financial Resource Strain: Not on file  Food Insecurity: Not on file  Transportation Needs: Not on file  Physical Activity: Not on file  Stress: Not on file  Social Connections: Not on file  Intimate Partner Violence: Not on file    Outpatient Medications Prior to Visit  Medication Sig Dispense Refill   Ascorbic Acid (VITAMIN C) 100 MG tablet Take 100 mg by mouth daily.     baclofen (LIORESAL) 10 MG tablet Take 1 tablet (10 mg total) by mouth 3 (three) times daily as needed for muscle spasms. 30 tablet 5   calcium carbonate (TUMS - DOSED IN MG ELEMENTAL CALCIUM) 500 MG chewable tablet Chew  1 tablet by mouth daily.     celecoxib (CELEBREX) 100 MG capsule TAKE 1 CAPSULE BY MOUTH TWICE DAILY AS NEEDED 60 capsule 0   clobetasol cream (TEMOVATE) 0.05 % Apply 1 application topically 2 (two) times daily. 30 g 0   co-enzyme Q-10 50 MG capsule Take 100 mg by mouth.     COVID-19 mRNA Vac-TriS, Pfizer, (PFIZER-BIONT COVID-19 VAC-TRIS) SUSP injection Inject into the muscle. 0.3 mL 0   cyclobenzaprine (FLEXERIL) 10 MG tablet Take 5-10 mg by mouth 3 (three) times daily.     ezetimibe (ZETIA) 10 MG tablet Take 1 tablet (10 mg total) by mouth daily. 30 tablet 3   fish oil-omega-3 fatty acids 1000 MG capsule Take 1 g by mouth every other day.     fluticasone (FLONASE) 50 MCG/ACT nasal spray Place 2 sprays into both nostrils daily. 16 g 6   gabapentin (NEURONTIN) 100 MG capsule Take 1 capsule (100 mg total) by mouth 3 (three) times daily. 90 capsule 2   Lactobacillus (PROBIOTIC ACIDOPHILUS PO) Take by mouth.     loratadine (CLARITIN) 10 MG tablet TAKE 1 TABLET BY MOUTH ONCE DAILY AS NEEDED 45 tablet 0   Multiple Vitamin (MULTIVITAMIN) tablet Take 1 tablet by mouth once daily 100 tablet 3   omeprazole (PRILOSEC) 20 MG  capsule Take 20 mg by mouth daily.     triamcinolone cream (KENALOG) 0.1 % APPLY  CREAM EXTERNALLY TO AFFECTED AREA(S) TWICE DAILY 30 g 0   Vitamin D, Ergocalciferol, (DRISDOL) 1.25 MG (50000 UNIT) CAPS capsule Take 1 capsule (50,000 Units total) by mouth every 7 (seven) days. 12 capsule 0   No facility-administered medications prior to visit.    Allergies  Allergen Reactions   Codeine Nausea And Vomiting   Prednisone Nausea And Vomiting   Hydrocodone Nausea And Vomiting    ROS    See HPI Objective:    Physical Exam Constitutional:      Appearance: Normal appearance.  Pulmonary:     Effort: Pulmonary effort is normal.  Skin:    Comments: Fine maculopapular rash noted on forehead/eyelids, left forearm, left neck  Neurological:     Mental Status: She is alert.    BP 122/81 (BP Location: Right Arm, Patient Position: Sitting, Cuff Size: Small)   Pulse 83   Temp 97.8 F (36.6 C) (Oral)   Resp 16   Wt 155 lb (70.3 kg)   SpO2 100%   BMI 28.35 kg/m  Wt Readings from Last 3 Encounters:  12/08/20 155 lb (70.3 kg)  11/08/20 156 lb (70.8 kg)  03/12/20 159 lb 2 oz (72.2 kg)       Assessment & Plan:   Problem List Items Addressed This Visit       Unprioritized   Poison ivy dermatitis - Primary    New.  Solumedrol 125mg  IM today in the office. This will be followed by prednisone 20mg  once daily for 5 days. She states she can handle lower doses of prednisone but higher doses can cause her nausea.  Patient is advised as follows:   For itching, you may use claritin 10mg  once daily. You may take benadryl in the evenings as needed.   Start prednisone tomorrow.  Call if symptoms worsen or if not improved in 3-4 days.        Other Visit Diagnoses     Dark urine       Relevant Orders   POCT Urinalysis Dipstick (Automated) (Completed)   Urine Culture  Proteinuria, unspecified type       Relevant Orders   Urinalysis, Routine w reflex microscopic       I am having  Early B. Cartaya start on predniSONE. I am also having her maintain her fish oil-omega-3 fatty acids, co-enzyme Q-10, clobetasol cream, fluticasone, triamcinolone cream, calcium carbonate, vitamin C, Lactobacillus (PROBIOTIC ACIDOPHILUS PO), loratadine, multivitamin, ezetimibe, omeprazole, baclofen, gabapentin, cyclobenzaprine, celecoxib, Pfizer-Bio[employer] COVID-19 Vac-TriS, and Vitamin D (Ergocalciferol). We administered methylPREDNISolone sodium succinate.  Meds ordered this encounter  Medications   predniSONE (DELTASONE) 10 MG tablet    Sig: Take 2 tablets (20 mg total) by mouth daily with breakfast for 5 days.    Dispense:  10 tablet    Refill:  0    Order Specific Question:   Supervising Provider    Answer:   Danise Edge A [[id number]]   DISCONTD: methylPREDNISolone sodium succinate (SOLU-MEDROL) 130 mg in sodium chloride 0.9 % 50 mL IVPB   methylPREDNISolone sodium succinate (SOLU-MEDROL) 125 mg/2 mL injection 125 mg

## 2020-12-09 LAB — URINALYSIS, ROUTINE W REFLEX MICROSCOPIC
Bilirubin Urine: NEGATIVE
Ketones, ur: NEGATIVE
Leukocytes,Ua: NEGATIVE
Nitrite: NEGATIVE
Specific Gravity, Urine: >=1.03 — AB (ref 1.000–1.030)
Total Protein, Urine: NEGATIVE
Urine Glucose: NEGATIVE
Urobilinogen, UA: 0.2 (ref 0.0–1.0)
pH: 6 (ref 5.0–8.0)

## 2020-12-09 LAB — URINE CULTURE
MICRO NUMBER:: 12086736
SPECIMEN QUALITY:: ADEQUATE

## 2020-12-31 ENCOUNTER — Other Ambulatory Visit: Payer: Self-pay

## 2020-12-31 ENCOUNTER — Encounter: Payer: Self-pay | Admitting: Family Medicine

## 2020-12-31 ENCOUNTER — Ambulatory Visit: Payer: Commercial Managed Care - PPO | Admitting: Family Medicine

## 2020-12-31 VITALS — BP 110/70 | HR 88 | Temp 98.0°F | Ht 62.0 in | Wt 154.4 lb

## 2020-12-31 DIAGNOSIS — M79672 Pain in left foot: Secondary | ICD-10-CM

## 2020-12-31 DIAGNOSIS — S76302A Unspecified injury of muscle, fascia and tendon of the posterior muscle group at thigh level, left thigh, initial encounter: Secondary | ICD-10-CM

## 2020-12-31 NOTE — Patient Instructions (Signed)
Heat (pad or rice pillow in microwave) over affected area, 10-15 minutes twice daily.   Ice/cold pack over area for 10-15 min twice daily.  OK to take Tylenol 1000 mg (2 extra strength tabs) or 975 mg (3 regular strength tabs) every 6 hours as needed.  I like Celebrex with this.  Consider topical menthol (Biofreeze).  Foods that may reduce pain: 1) Ginger 2) Blueberries 3) Salmon 4) Pumpkin seeds 5) dark chocolate 6) turmeric 7) tart cherries 8) virgin olive oil 9) chilli peppers 10) mint 11) red wine  Let us know if you need anything.  Semimembranosus Tendinitis Rehab  It is normal to feel mild stretching, pulling, tightness, or discomfort as you do these exercises, but you should stop right away if you feel sudden pain or your pain gets worse.  Stretching and range of motion exercises These exercises warm up your muscles and joints and improve the movement and flexibility of your thigh. These exercises also help to relieve pain, numbness, and tingling. Exercise A: Hamstring stretch, supine    Lie on your back. Loop a belt or towel across the ball of your left / right foot The ball of your foot is on the walking surface, right under your toes. Straighten your left / right knee and slowly pull on the belt to raise your leg. Stop when you feel a gentle stretch behind your left / right knee or thigh. Do not allow the knee to bend. Keep your other leg flat on the floor. Hold this position for 30 seconds. Repeat 2 times. Complete this exercise 3 times a week. Strengthening exercises These exercises build strength and endurance in your thigh. Endurance is the ability to use your muscles for a long time, even after they get tired. Exercise B: Straight leg raises (hip extensors) Lie on your belly on a bed or a firm surface with a pillow under your hips. Bend your left / right knee so your foot is straight up in the air. Squeeze your buttock muscles and lift your left / right thigh  off the bed. Do not let your back arch. Hold this position for 3 seconds. Slowly return to the starting position. Let your muscles relax completely before you do another repetition. Repeat 2 times. Complete this exercise 3 times a week. Exercise C: Bridge (hip extensors)     Lie on your back on a firm surface with your knees bent and your feet flat on the floor. Tighten your buttocks muscles and lift your bottom off the floor until your trunk is level with your thighs. You should feel the muscles working in your buttocks and the back of your thighs. If you do not feel these muscles, slide your feet 1-2 inches (2.5-5 cm) farther away from your buttocks. Do not arch your back. Hold this position for 3 seconds. Slowly lower your hips to the starting position. Let your buttocks muscles relax completely between repetitions. If this exercise is too easy, try doing it with your arms crossed over your chest. Repeat 2 times. Complete this exercise 3 times a week. Exercise D: Hamstring eccentric, prone Lie on your belly on a bed or on the floor. Start with your legs straight. Cross your legs at the ankles with your left / right leg on top. Using your bottom leg to do the work, bend both knees. Using just your left / right leg alone, slowly lower your leg back down toward the bed. Add a 5 lb weight as told by  your health care provider. Let your muscles relax completely between repetitions. Repeat 2 times. Complete this exercise 3 times a week. Exercise E: Squats Stand in front of a table, with your feet and knees pointing straight ahead. You may rest your hands on the table for balance but not for support. Slowly bend your knees and lower your hips like you are going to sit in a chair. Keep your thighs straight or pointed slightly outward. Keep your weight over your heels, not over your toes. Keep your lower legs upright so they are parallel with the table legs. Do not let your hips go lower than  your knees. Stop when your knees are bent to the shape of an upside-down letter "L" (90 degree angle). Do not bend lower than told by your health care provider. If your knee pain increases, do not bend as low. Hold the squat position 1-2 seconds. Slowly push with your legs to return to standing. Do not use your hands to pull yourself to standing. Repeat 2 times. Complete this exercise 3 times a week. Make sure you discuss any questions you have with your health care provider. Document Released: 05/22/2005 Document Revised: 01/27/2016 Document Reviewed: 02/23/2015 Elsevier Interactive Patient Education  2018 [employer]  Ankle Exercises It is normal to feel mild stretching, pulling, tightness, or discomfort as you do these exercises, but you should stop right away if you feel sudden pain or your pain gets worse.  Stretching and range of motion exercises These exercises warm up your muscles and joints and improve the movement and flexibility of your ankle. These exercises also help to relieve pain, numbness, and tingling. Exercise A: Dorsiflexion/Plantar Flexion    Sit with your affected knee straight or bent. Do not rest your foot on anything. Flex your affected ankle to tilt the top of your foot toward your shin. Hold this position for 5 seconds. Point your toes downward to tilt the top of your foot away from your shin. Hold this position for 5 seconds. Repeat 2 times. Complete this exercise 3 times per week. Exercise B: Ankle Alphabet    Sit with your affected foot supported at your lower leg. Do not rest your foot on anything. Make sure your foot has room to move freely. Think of your affected foot as a paintbrush, and move your foot to trace each letter of the alphabet in the air. Keep your hip and knee still while you trace. Make the letters as large as you can without increasing any discomfort. Trace every letter from A to Z. Repeat 2 times. Complete this exercise 3 times per  week. Strengthening exercises These exercises build strength and endurance in your ankle. Endurance is the ability to use your muscles for a long time, even after they get tired. Exercise D: Dorsiflexors    Secure a rubber exercise band or tube to an object, such as a table leg, that will stay still when the band is pulled. Secure the other end around your affected foot. Sit on the floor, facing the object with your affected leg extended. The band or tube should be slightly tense when your foot is relaxed. Slowly flex your affected ankle and toes to bring your foot toward you. Hold this position for 3 seconds.  Slowly return your foot to the starting position, controlling the band as you do that. Do a total of 10 repetitions. Repeat 2 times. Complete this exercise 3 times per week. Exercise E: Plantar Flexors    Sit  on the floor with your affected leg extended. Loop a rubber exercise band or tube around the ball of your affected foot. The ball of your foot is on the walking surface, right under your toes. The band or tube should be slightly tense when your foot is relaxed. Slowly point your toes downward, pushing them away from you. Hold this position for 3 seconds. Slowly release the tension in the band or tube, controlling smoothly until your foot is back in the starting position. Repeat for a total of 10 repetitions. Repeat 2 times. Complete this exercise 3 times per week. Exercise F: Towel Curls    Sit in a chair on a non-carpeted surface, and put your feet on the floor. Place a towel in front of your feet.  Keeping your heel on the floor, put your affected foot on the towel. Pull the towel toward you by grabbing the towel with your toes and curling them under. Keep your heel on the floor. Let your toes relax. Grab the towel again. Keep going until the towel is completely underneath your foot. Repeat for a total of 10 repetitions. Repeat 2 times. Complete this exercise 3 times per  week. Exercise G: Heel Raise ( Plantar Flexors, Standing)     Stand with your feet shoulder-width apart. Keep your weight spread evenly over the width of your feet while you rise up on your toes. Use a wall or table to steady yourself, but try not to use it for support. If this exercise is too easy, try these options: Shift your weight toward your affected leg until you feel challenged. If told by your health care provider, lift your uninjured leg off the floor. Hold this position for 3 seconds. Repeat for a total of 10 repetitions. Repeat 2 times. Complete this exercise 3 times per week. Exercise H: Tandem Walking Stand with one foot directly in front of the other. Slowly raise your back foot up, lifting your heel before your toes, and place it directly in front of your other foot. Continue to walk in this heel-to-toe way for 10 steps or for as long as told by your health care provider. Have a countertop or wall nearby to use if needed to keep your balance, but try not to hold onto anything for support. Repeat 2 times. Complete this exercises 3 times per week. Make sure you discuss any questions you have with your health care provider. Document Released: 04/05/2005 Document Revised: 01/20/2016 Document Reviewed: 02/07/2015 Elsevier Interactive Patient Education  2018 [employer].

## 2020-12-31 NOTE — Progress Notes (Signed)
Musculoskeletal Exam  Patient: Christine Griffith DOB: 10/10/1965  DOS: 12/31/2020  SUBJECTIVE:  Chief Complaint:   Chief Complaint  Patient presents with   Foot Pain   Leg Pain    Left leg and foot pain Back pain     Christine Griffith is a 55 y.o.  female for evaluation and treatment of L foot pain.   Onset:  3 weeks ago. Wheelchair hit in her left knee/thigh while at work.  She does not have a vivid memory of the accident but does remember limping for the rest of the day afterwards and ever since. Location: top of L foot, lateral ankle, left hamstring region Character:  aching, burning, and throbbing  Progression of issue:  is unchanged Associated symptoms: pain w ambulation; radiates to medial hamstring and inner calf Denies: redness, swelling, bruising Treatment: to date has been prescription NSAIDS, Tylenol, ice, heat, and oral steroids.   Neurovascular symptoms: no  Past Medical History:  Diagnosis Date   Chicken pox    Environmental allergies    GERD (gastroesophageal reflux disease)    Glaucoma (increased eye pressure)    Borderline   Seasonal allergies    Vitamin D deficiency     Objective: VITAL SIGNS: BP 110/70   Pulse 88   Temp 98 F (36.7 C) (Oral)   Ht 5\' 2"  (1.575 m)   Wt 154 lb 6 oz (70 kg)   SpO2 97%   BMI 28.24 kg/m  Constitutional: Well formed, well developed. No acute distress. Thorax & Lungs: No accessory muscle use Musculoskeletal: Left ankle/hamstring.   Normal active range of motion: yes.   Normal passive range of motion: yes Tenderness to palpation: Yes over medial and lateral left hamstring, extensor tendons of the midfoot, lateral soft tissue of the ankle Deformity: no Ecchymosis: no Tests positive: None Tests negative: Squeeze, anterior drawer Neurologic: Normal sensory function. No focal deficits noted.  Antalgic gait, no cerebellar signs Psychiatric: Normal mood. Age appropriate judgment and insight. Alert & oriented x 3.     Assessment:  Left foot pain  Left hamstring injury, initial encounter  Plan: Stretches/exercises, heat, ice, Tylenol.  Continue Celebrex and Flexeril.  Foods that may help with inflammation provided.  Stay hydrated.  Flat soled shoe recommended.  She will get one over-the-counter.  She needs FMLA, she will let me know.  She has a pending x-ray from provider.  I would consider physical therapy versus sports med referral if she does not improve. F/u as originally scheduled. The patient voiced understanding and agreement to the plan.  I spent 35 minutes with the patient evaluating her, discussing the above plan, and reviewing her chart on the same day of the visit.  [employer] [place], DO 12/31/20  1:52 PM

## 2021-02-10 ENCOUNTER — Other Ambulatory Visit: Payer: Self-pay

## 2021-02-10 ENCOUNTER — Other Ambulatory Visit: Payer: Self-pay | Admitting: Family Medicine

## 2021-02-10 ENCOUNTER — Other Ambulatory Visit (INDEPENDENT_AMBULATORY_CARE_PROVIDER_SITE_OTHER): Payer: Commercial Managed Care - PPO

## 2021-02-10 DIAGNOSIS — E559 Vitamin D deficiency, unspecified: Secondary | ICD-10-CM | POA: Diagnosis not present

## 2021-02-10 LAB — VITAMIN D 25 HYDROXY (VIT D DEFICIENCY, FRACTURES): VITD: 27.21 ng/mL — ABNORMAL LOW (ref 30.00–100.00)

## 2021-02-10 MED ORDER — VITAMIN D (ERGOCALCIFEROL) 1.25 MG (50000 UNIT) PO CAPS: 50000.0000 [IU] | ORAL_CAPSULE | ORAL | 0 refills | Status: DC

## 2021-02-11 ENCOUNTER — Other Ambulatory Visit (INDEPENDENT_AMBULATORY_CARE_PROVIDER_SITE_OTHER): Payer: Commercial Managed Care - PPO

## 2021-02-11 DIAGNOSIS — R739 Hyperglycemia, unspecified: Secondary | ICD-10-CM | POA: Diagnosis not present

## 2021-02-11 LAB — LIPID PANEL
Cholesterol: 207 mg/dL — ABNORMAL HIGH (ref 0–200)
HDL: 51.5 mg/dL (ref >39.00)
LDL Cholesterol: 139 mg/dL — ABNORMAL HIGH (ref 0–99)
NonHDL: 155.72
Total CHOL/HDL Ratio: 4
Triglycerides: 84 mg/dL (ref 0.0–149.0)
VLDL: 16.8 mg/dL (ref 0.0–40.0)

## 2021-02-16 ENCOUNTER — Other Ambulatory Visit: Payer: Self-pay | Admitting: Family Medicine

## 2021-02-16 DIAGNOSIS — E785 Hyperlipidemia, unspecified: Secondary | ICD-10-CM

## 2021-02-17 NOTE — Addendum Note (Signed)
Addended by: Mervin Kung A on: 02/17/2021 02:00 PM   Modules accepted: Orders

## 2021-02-18 ENCOUNTER — Other Ambulatory Visit (INDEPENDENT_AMBULATORY_CARE_PROVIDER_SITE_OTHER): Payer: Commercial Managed Care - PPO

## 2021-02-18 DIAGNOSIS — E611 Iron deficiency: Secondary | ICD-10-CM

## 2021-02-18 DIAGNOSIS — K625 Hemorrhage of anus and rectum: Secondary | ICD-10-CM | POA: Diagnosis not present

## 2021-02-18 LAB — FECAL OCCULT BLOOD, IMMUNOCHEMICAL: Fecal Occult Bld: NEGATIVE

## 2021-03-22 ENCOUNTER — Other Ambulatory Visit: Payer: Self-pay | Admitting: Family Medicine

## 2021-04-02 ENCOUNTER — Other Ambulatory Visit: Payer: Self-pay | Admitting: Family Medicine

## 2021-04-08 ENCOUNTER — Ambulatory Visit: Payer: Commercial Managed Care - PPO | Admitting: Family Medicine

## 2021-05-18 ENCOUNTER — Telehealth: Payer: Self-pay | Admitting: Family Medicine

## 2021-05-18 NOTE — Telephone Encounter (Signed)
Called the patient and she has appt. With PCP on Friday 05/20/21 and will keep this time.

## 2021-05-18 NOTE — Telephone Encounter (Signed)
Pt called and asked to speak to Robin. She didn't go into much detail. She gave brief reasons for her call.  -believes she has a UTI. Is having pressure and an odor. - she is supposed to come in tomorrow for labs and is requesting not to get her cholesterol checked due to med issues but she wants to discuss this further with Robin.

## 2021-05-19 ENCOUNTER — Other Ambulatory Visit: Payer: Commercial Managed Care - PPO

## 2021-05-20 ENCOUNTER — Encounter: Payer: Self-pay | Admitting: Family Medicine

## 2021-05-20 ENCOUNTER — Ambulatory Visit: Payer: Commercial Managed Care - PPO | Admitting: Family Medicine

## 2021-05-20 ENCOUNTER — Other Ambulatory Visit (HOSPITAL_COMMUNITY)
Admission: RE | Admit: 2021-05-20 | Discharge: 2021-05-20 | Disposition: A | Discharge status: Home / Self Care | Payer: Commercial Managed Care - PPO | Source: Ambulatory Visit | Attending: Family Medicine | Admitting: Family Medicine

## 2021-05-20 VITALS — BP 120/78 | HR 100 | Temp 97.9°F | Ht 63.0 in | Wt 160.5 lb

## 2021-05-20 DIAGNOSIS — R7303 Prediabetes: Secondary | ICD-10-CM | POA: Diagnosis not present

## 2021-05-20 DIAGNOSIS — R300 Dysuria: Secondary | ICD-10-CM

## 2021-05-20 DIAGNOSIS — E785 Hyperlipidemia, unspecified: Secondary | ICD-10-CM

## 2021-05-20 LAB — LIPID PANEL
Cholesterol: 246 mg/dL — ABNORMAL HIGH (ref 0–200)
HDL: 49.9 mg/dL (ref >39.00)
LDL Cholesterol: 179 mg/dL — ABNORMAL HIGH (ref 0–99)
NonHDL: 195.71
Total CHOL/HDL Ratio: 5
Triglycerides: 82 mg/dL (ref 0.0–149.0)
VLDL: 16.4 mg/dL (ref 0.0–40.0)

## 2021-05-20 LAB — POC URINALSYSI DIPSTICK (AUTOMATED)
Bilirubin, UA: NEGATIVE
Blood, UA: NEGATIVE
Glucose, UA: NEGATIVE
Ketones, UA: NEGATIVE
Leukocytes, UA: NEGATIVE
Nitrite, UA: NEGATIVE
Protein, UA: NEGATIVE
Spec Grav, UA: 1.015 (ref 1.010–1.025)
Urobilinogen, UA: NEGATIVE E.U./dL — AB
pH, UA: 6.5 (ref 5.0–8.0)

## 2021-05-20 LAB — HEPATIC FUNCTION PANEL
ALT: 17 U/L (ref 0–35)
AST: 12 U/L (ref 0–37)
Albumin: 4.1 g/dL (ref 3.5–5.2)
Alkaline Phosphatase: 91 U/L (ref 39–117)
Bilirubin, Direct: 0.1 mg/dL (ref 0.0–0.3)
Total Bilirubin: 0.5 mg/dL (ref 0.2–1.2)
Total Protein: 6.9 g/dL (ref 6.0–8.3)

## 2021-05-20 LAB — HEMOGLOBIN A1C: Hgb A1c MFr Bld: 5.9 % (ref 4.6–6.5)

## 2021-05-20 MED ORDER — FLUCONAZOLE 150 MG PO TABS: ORAL_TABLET | ORAL | 0 refills | Status: DC

## 2021-05-20 MED ORDER — NITROFURANTOIN MONOHYD MACRO 100 MG PO CAPS: 100.0000 mg | ORAL_CAPSULE | Freq: Two times a day (BID) | ORAL | 0 refills | Status: AC

## 2021-05-20 MED ORDER — PRAVASTATIN SODIUM 10 MG PO TABS
10.0000 mg | ORAL_TABLET | Freq: Every day | ORAL | 4 refills | Status: DC
Start: 2021-05-20 — End: 2021-12-05

## 2021-05-20 NOTE — Patient Instructions (Addendum)
Stay hydrated.   Warning signs/symptoms: Uncontrollable nausea/vomiting, fevers, worsening symptoms despite treatment, confusion.  Give [place] around 2 business days to get culture back to you.  Give [place] 2-3 business days to get the results of your labs back.   Keep the diet clean and stay active.  Aim to do some physical exertion for 150 minutes per week. This is typically divided into 5 days per week, 30 minutes per day. The activity should be enough to get your heart rate up. Anything is better than nothing if you have time constraints.  Go back on your stretches/exercises for your beck.   Let us know if you need anything.

## 2021-05-20 NOTE — Progress Notes (Signed)
Chief Complaint  Patient presents with   Urinary Frequency    Urine odor    Christine Griffith is a 55 y.o. female here for possible UTI.  Duration: 4 days. Symptoms: Dysuria, urinary retention and urgency, pressure Denies: hematuria, urinary hesitancy, fever, nausea, vomiting, urinary incontinence, flank pain, vaginal discharge Hx of recurrent UTI? No Denies new sexual partners.  Hyperlipidemia Patient presents for dyslipidemia follow up. Currently being treated with Zetia 10 mg/d and compliance with treatment thus far has been good. She denies myalgias. Having palpitations.  She is usually adhering to a healthy diet. Exercise: walking The patient is not known to have coexisting coronary artery disease.  Past Medical History:  Diagnosis Date   Chicken pox    Environmental allergies    GERD (gastroesophageal reflux disease)    Glaucoma (increased eye pressure)    Borderline   Seasonal allergies    Vitamin D deficiency      BP 120/78    Pulse 100    Temp 97.9 F (36.6 C) (Oral)    Ht 5\' 3"  (1.6 m)    Wt 160 lb 8 oz (72.8 kg)    SpO2 99%    BMI 28.43 kg/m  General: Awake, alert, appears stated age Heart: RRR, No LE edema Lungs: CTAB, normal respiratory effort, no accessory muscle usage Abd: BS+, soft, NT, ND, no masses or organomegaly MSK: No CVA tenderness, neg Lloyd's sign Psych: Age appropriate judgment and insight  Dysuria - Plan: fluconazole (DIFLUCAN) 150 MG tablet, nitrofurantoin, macrocrystal-monohydrate, (MACROBID) 100 MG capsule  Hyperlipidemia, unspecified hyperlipidemia type - Plan: pravastatin (PRAVACHOL) 10 MG tablet, Lipid panel, Hepatic function panel  Prediabetes - Plan: Hemoglobin A1c  Stay hydrated. Seek immediate care if pt starts to develop fevers, new/worsening symptoms, uncontrollable N/V. Empirically tx with Macrobid for 5 d.  Chronic, uncontrolled, AE from med. Stop Zetia. Start pravastatin 10 mg/d. Ck labs. F/u in 6 weeks for this. Counseled on  diet/exercise. Ck A1c. The patient voiced understanding and agreement to the plan.  [place], DO 05/20/21 8:05 AM

## 2021-05-22 LAB — URINE CULTURE
MICRO NUMBER:: 12767498
Result:: NO GROWTH
SPECIMEN QUALITY:: ADEQUATE

## 2021-05-23 LAB — URINE CYTOLOGY ANCILLARY ONLY
Bacterial Vaginitis-Urine: NEGATIVE
Candida Urine: NEGATIVE
Comment: NEGATIVE
Trichomonas: NEGATIVE

## 2021-06-21 ENCOUNTER — Telehealth: Payer: Self-pay | Admitting: Family Medicine

## 2021-06-21 ENCOUNTER — Encounter: Payer: Self-pay | Admitting: Family Medicine

## 2021-06-21 ENCOUNTER — Ambulatory Visit: Payer: Commercial Managed Care - PPO | Admitting: Family Medicine

## 2021-06-21 VITALS — BP 120/60 | HR 111 | Temp 98.2°F | Ht 63.0 in | Wt 163.5 lb

## 2021-06-21 DIAGNOSIS — H6981 Other specified disorders of Eustachian tube, right ear: Secondary | ICD-10-CM | POA: Diagnosis not present

## 2021-06-21 MED ORDER — METHYLPREDNISOLONE 4 MG PO TBPK: ORAL_TABLET | ORAL | 0 refills | Status: DC

## 2021-06-21 NOTE — Patient Instructions (Addendum)
OK to take Tylenol 1000 mg (2 extra strength tabs) or 975 mg (3 regular strength tabs) every 6 hours as needed.  Consider throat lozenges, salt water gargles and an air humidifier for symptomatic care.   Don't take the Celebrex while on the Medrol Dosepak.   Let us know if you need anything.

## 2021-06-21 NOTE — Progress Notes (Signed)
Chief Complaint  Patient presents with   Sore Throat   Ear Pain    Christine Griffith here for URI complaints.  Duration: 1 day Associated symptoms: subjective fever, sinus headache, ear pain, and sore throat Denies: sinus congestion, sinus pain, rhinorrhea, itchy watery eyes, ear drainage, wheezing, shortness of breath, and fevers Treatment to date: none Sick contacts: Mom as been ill Shingrix #2 3 d ago.   Past Medical History:  Diagnosis Date   Chicken pox    Environmental allergies    GERD (gastroesophageal reflux disease)    Glaucoma (increased eye pressure)    Borderline   Seasonal allergies    Vitamin D deficiency     Objective BP 120/60    Pulse (!) 111    Temp 98.2 F (36.8 C) (Oral)    Ht 5\' 3"  (1.6 m)    Wt 163 lb 8 oz (74.2 kg)    SpO2 99%    BMI 28.96 kg/m  General: Awake, alert, appears stated age HEENT: AT, [place], ears patent b/l and TM neg on L, retracted on R, nares patent w/o discharge, pharynx pink and without exudates, MMM Neck: No masses or asymmetry Heart: RRR Lungs: CTAB, no accessory muscle use Psych: Age appropriate judgment and insight, normal mood and affect  Dysfunction of right eustachian tube - Plan: methylPREDNISolone (MEDROL DOSEPAK) 4 MG TBPK tablet  Continue to push fluids, practice good hand hygiene, cover mouth when coughing. F/u prn. If starting to experience fevers, shaking, or shortness of breath, seek immediate care. Pt voiced understanding and agreement to the plan.  [place], DO 06/21/21 3:01 PM

## 2021-07-01 ENCOUNTER — Telehealth: Payer: Self-pay | Admitting: Family Medicine

## 2021-07-01 ENCOUNTER — Other Ambulatory Visit (INDEPENDENT_AMBULATORY_CARE_PROVIDER_SITE_OTHER): Payer: Commercial Managed Care - PPO

## 2021-07-01 ENCOUNTER — Ambulatory Visit: Payer: Commercial Managed Care - PPO | Admitting: Family Medicine

## 2021-07-01 DIAGNOSIS — E559 Vitamin D deficiency, unspecified: Secondary | ICD-10-CM | POA: Diagnosis not present

## 2021-07-01 DIAGNOSIS — E785 Hyperlipidemia, unspecified: Secondary | ICD-10-CM

## 2021-07-01 LAB — VITAMIN D 25 HYDROXY (VIT D DEFICIENCY, FRACTURES): VITD: 34.51 ng/mL (ref 30.00–100.00)

## 2021-07-01 LAB — LIPID PANEL
Cholesterol: 221 mg/dL — ABNORMAL HIGH (ref 0–200)
HDL: 49.6 mg/dL (ref >39.00)
LDL Cholesterol: 152 mg/dL — ABNORMAL HIGH (ref 0–99)
NonHDL: 171.31
Total CHOL/HDL Ratio: 4
Triglycerides: 96 mg/dL (ref 0.0–149.0)
VLDL: 19.2 mg/dL (ref 0.0–40.0)

## 2021-07-01 NOTE — Telephone Encounter (Signed)
Pt came in office stating received a bill for her labs for $308.52 for the date 05-20-2021, pt has [facility] insurance and mentioned has never received a bill from labs since [employer] covers for her labs, pt called the billing department to verify why she got the bill, billing department stated that provider did not put code stating that labs was done in office as part of her visit and also that it didn't have more details reason why for having labs done. Pt is needing the labs billed with info included so that insurance will cover for it. Pt left copy of her bill (given to [address]) Please advise.

## 2021-07-01 NOTE — Telephone Encounter (Signed)
Christine Griffith has sent an email to [employer] and to the Manager of Patient Accounting to look into this for the patient.  Someone from that department should be getting back in touch with the patient after they research the billing issue.

## 2021-07-04 NOTE — Telephone Encounter (Signed)
Spoke with pt and informed the below. Pt is aware. Done.

## 2021-07-22 ENCOUNTER — Telehealth: Payer: Self-pay | Admitting: Family Medicine

## 2021-07-22 NOTE — Telephone Encounter (Signed)
Pt dropped off FMLA paperwork to be filled out by Dr. Carmelia Roller. Placed in providers tray.   Pt also would like to know if Christine Griffith could pull results from other providers from 11/22-2/23.   Please advise.

## 2021-07-25 NOTE — Telephone Encounter (Signed)
Appt scheduled 08/01/21

## 2021-07-25 NOTE — Telephone Encounter (Signed)
Last appt 06/21/2021 Does she need appt to complete paperwork?

## 2021-07-26 NOTE — Telephone Encounter (Signed)
Opened in error

## 2021-08-01 ENCOUNTER — Ambulatory Visit: Payer: Commercial Managed Care - PPO | Admitting: Family Medicine

## 2021-08-01 ENCOUNTER — Encounter: Payer: Self-pay | Admitting: Family Medicine

## 2021-08-01 VITALS — BP 128/72 | HR 84 | Temp 98.2°F | Resp 16 | Ht 62.0 in | Wt 162.4 lb

## 2021-08-01 DIAGNOSIS — R109 Unspecified abdominal pain: Secondary | ICD-10-CM

## 2021-08-01 DIAGNOSIS — M5416 Radiculopathy, lumbar region: Secondary | ICD-10-CM | POA: Insufficient documentation

## 2021-08-01 NOTE — Patient Instructions (Signed)
Consider capsaicin cream.   Let us know if you need anything.

## 2021-08-01 NOTE — Progress Notes (Signed)
Chief Complaint  Patient presents with   FMLA     FMLA paper work    Subjective: Patient is a 56 y.o. female here for f/u.  Pt dealing with chronic lumbar radiculopathy.  She saw a neurologist last month and had a negative EMG/NCT.  She is currently seeing the pain management team in [place].  She has an epidural injection to be scheduled for either April or May.  She received 1 several years ago that did help but she had more immediate pain for the first week.  Her job requires lifting and transferring of patients.  When she has flares of her pain or is recovering from injection, she would not be able to complete her job.  She is requesting FMLA for intermittent leave anticipation of this.  She continues to have chronic abdominal pain/burning over the right side of her abdomen.  She has had a negative upper endoscopy/colonoscopy and CT abdomen/pelvis since this time.  She has had a hysterectomy and has no ovaries.  She does take gabapentin for her lumbar radiculopathy.  She has not noticed any improvement with her burning sensation.  Denies any associated nausea, vomiting, bowel changes, or urinary complaints.  Past Medical History:  Diagnosis Date   Chicken pox    Environmental allergies    GERD (gastroesophageal reflux disease)    Glaucoma (increased eye pressure)    Borderline   Seasonal allergies    Vitamin D deficiency     Objective: BP 128/72 (BP Location: Right Arm, Patient Position: Sitting, Cuff Size: Normal)    Pulse 84    Temp 98.2 F (36.8 C) (Oral)    Resp 16    Ht 5\' 2"  (1.575 m)    Wt 162 lb 6.4 oz (73.7 kg)    SpO2 96%    BMI 29.70 kg/m  General: Awake, appears stated age Heart: RRR, no LE edema Lungs: CTAB, no rales, wheezes or rhonchi. No accessory muscle use Abdomen: Bowel sounds present, soft, nondistended, mild discomfort over the right abdominal region at the level of the umbilicus.  No masses. Psych: Age appropriate judgment and insight, normal affect and  mood  Assessment and Plan: Lumbar radiculopathy  Abdominal pain, unspecified abdominal location  FMLA form filled out for intermittent leave.  We decided on 2 days for flares and up to 2 weeks for recovery from injections.  This could change depending on her response. Discussed doing nothing versus trying a medication.  Offered nortriptyline which she politely declined at this time.  Continue gabapentin. Follow-up as originally scheduled. The patient voiced understanding and agreement to the plan.  I spent 30 minutes with the patient discussing the above plan in addition to reviewing her chart and filling out paperwork on the same day of the visit.  [place], DO 08/01/21  12:19 PM

## 2021-08-02 ENCOUNTER — Other Ambulatory Visit: Payer: Self-pay | Admitting: Family Medicine

## 2021-08-30 ENCOUNTER — Ambulatory Visit: Payer: Commercial Managed Care - PPO | Admitting: Medical

## 2021-08-30 VITALS — BP 143/78 | HR 95 | Temp 98.3°F | Resp 18 | Ht 62.0 in | Wt 164.4 lb

## 2021-08-30 DIAGNOSIS — J302 Other seasonal allergic rhinitis: Secondary | ICD-10-CM

## 2021-08-30 DIAGNOSIS — J301 Allergic rhinitis due to pollen: Secondary | ICD-10-CM | POA: Diagnosis not present

## 2021-08-30 DIAGNOSIS — J029 Acute pharyngitis, unspecified: Secondary | ICD-10-CM | POA: Diagnosis not present

## 2021-08-30 DIAGNOSIS — H04203 Unspecified epiphora, bilateral lacrimal glands: Secondary | ICD-10-CM | POA: Diagnosis not present

## 2021-08-30 DIAGNOSIS — H9203 Otalgia, bilateral: Secondary | ICD-10-CM

## 2021-08-30 LAB — POCT RAPID STREP A (OFFICE): Rapid Strep A Screen: NEGATIVE

## 2021-08-30 LAB — POC COVID19 BINAXNOW: SARS Coronavirus 2 Ag: NEGATIVE

## 2021-08-30 MED ORDER — METHYLPREDNISOLONE 4 MG PO TABS: ORAL_TABLET | ORAL | 0 refills | Status: DC

## 2021-08-30 MED ORDER — FLUTICASONE PROPIONATE 50 MCG/ACT NA SUSP: 2.0000 | Freq: Every day | NASAL | 1 refills | Status: DC

## 2021-08-30 MED ORDER — AZITHROMYCIN 250 MG PO TABS: ORAL_TABLET | ORAL | 0 refills | Status: AC

## 2021-08-30 NOTE — Addendum Note (Signed)
Addended by: Gwenevere Abbot on: 08/30/2021 05:05 PM ? ? Modules accepted: Orders ? ?

## 2021-08-30 NOTE — Progress Notes (Signed)
? ?Subjective:  ? ? Patient ID: Christine Griffith, female    DOB: July 11, 1965, 56 y.o.   MRN: [id number] ? ?HPI ? ?Pt in for recent burning sensation in throat, watery eyes, ear achiness and slight ha. Pt symptoms started around 2:30.  At onset of symptoms pt thought had excess secretions. Mild pain swallowing.  ? ?Pt does have hx of spring allergies. She did not take her tab last night. ? ?Pt states took fish oil, probiotic and mulivitamin. This is something she takes every day.  ? ?Above symptoms started about 15-20 minutes of taking otc supplaments. She takes those every day. ? ?No itching to skin. No sob. No wheezing.  ? ? ? ?Review of Systems  ?Constitutional:  Negative for chills, fatigue and fever.  ?HENT:  Positive for ear pain, sinus pressure, sinus pain and sore throat. Negative for congestion, ear discharge, trouble swallowing and voice change.   ?Respiratory:  Negative for cough, chest tightness, shortness of breath and wheezing.   ?Cardiovascular:  Negative for chest pain and palpitations.  ?Gastrointestinal:  Negative for abdominal distention, abdominal pain, blood in stool and constipation.  ?Genitourinary:  Negative for dysuria.  ?Musculoskeletal:  Negative for back pain.  ?Neurological:  Negative for dizziness, seizures, light-headedness and headaches.  ?Hematological:  Negative for adenopathy. Does not bruise/bleed easily.  ?Psychiatric/Behavioral:  Negative for behavioral problems and confusion.   ? ?Past Medical History:  ?Diagnosis Date  ? Chicken pox   ? Environmental allergies   ? GERD (gastroesophageal reflux disease)   ? Glaucoma (increased eye pressure)   ? Borderline  ? Seasonal allergies   ? Vitamin D deficiency   ? ?  ?Social History  ? ?Socioeconomic History  ? Marital status: Legally Separated  ?  Spouse name: Not on file  ? Number of children: Not on file  ? Years of education: Not on file  ? Highest education level: Not on file  ?Occupational History  ? Not on file  ?Tobacco Use  ?  Smoking status: Never  ? Smokeless tobacco: Never  ?Vaping Use  ? Vaping Use: Never used  ?Substance and Sexual Activity  ? Alcohol use: Yes  ?  Comment: rare  ? Drug use: No  ? Sexual activity: Not on file  ?Other Topics Concern  ? Not on file  ?Social History Narrative  ? Not on file  ? ?Social Determinants of Health  ? ?Financial Resource Strain: Not on file  ?Food Insecurity: Not on file  ?Transportation Needs: Not on file  ?Physical Activity: Not on file  ?Stress: Not on file  ?Social Connections: Not on file  ?Intimate Partner Violence: Not on file  ? ? ?Past Surgical History:  ?Procedure Laterality Date  ? ABDOMINAL HYSTERECTOMY  2014  ? BREAST BIOPSY Right   ? stereo  ? CESAREAN SECTION    ? WISDOM TOOTH EXTRACTION    ? ? ?Family History  ?Problem Relation Age of Onset  ? Hypertension Mother   ?     Living  ? Stroke Mother   ?     multiple  ? COPD Mother   ? Diabetes Mother   ? COPD Father 38  ?     Deceased  ? Bone cancer Paternal Grandmother   ? Glaucoma Maternal Aunt   ? Hypertension Brother   ?     x3  ? Heart disease Brother   ?     x1  ? Asthma Son   ? Allergies Son   ?  Eczema Son   ?     x1  ? Eczema Daughter   ?     x3  ? ? ?Allergies  ?Allergen Reactions  ? Codeine Nausea And Vomiting  ? Prednisone Nausea And Vomiting  ? Hydrocodone Nausea And Vomiting  ? ? ?Current Outpatient Medications on File Prior to Visit  ?Medication Sig Dispense Refill  ? Ascorbic Acid (VITAMIN C) 100 MG tablet Take 100 mg by mouth daily.    ? baclofen (LIORESAL) 10 MG tablet Take 1 tablet (10 mg total) by mouth 3 (three) times daily as needed for muscle spasms. 30 tablet 5  ? calcium carbonate (TUMS - DOSED IN MG ELEMENTAL CALCIUM) 500 MG chewable tablet Chew 1 tablet by mouth daily.    ? celecoxib (CELEBREX) 100 MG capsule TAKE 1 CAPSULE BY MOUTH TWICE DAILY AS NEEDED 60 capsule 0  ? clobetasol cream (TEMOVATE) [id number] % Apply 1 application topically 2 (two) times daily. 30 g 0  ? co-enzyme Q-10 50 MG capsule Take 100 mg by  mouth.    ? COVID-19 mRNA Vac-TriS, Pfizer, (PFIZER-BIONT COVID-19 VAC-TRIS) SUSP injection Inject into the muscle. 0.3 mL 0  ? cyclobenzaprine (FLEXERIL) 10 MG tablet Take 5-10 mg by mouth 3 (three) times daily.    ? fish oil-omega-3 fatty acids 1000 MG capsule Take 1 g by mouth every other day.    ? fluconazole (DIFLUCAN) 150 MG tablet Take 1 tab, repeat in 72 hours if no improvement. 2 tablet 0  ? fluticasone (FLONASE) 50 MCG/ACT nasal spray Place 2 sprays into both nostrils daily. 16 g 6  ? gabapentin (NEURONTIN) 100 MG capsule Take 1 capsule (100 mg total) by mouth 3 (three) times daily. 90 capsule 2  ? Lactobacillus (PROBIOTIC ACIDOPHILUS PO) Take by mouth.    ? loratadine (CLARITIN) 10 MG tablet TAKE 1 TABLET BY MOUTH ONCE DAILY AS NEEDED 45 tablet 0  ? methylPREDNISolone (MEDROL DOSEPAK) 4 MG TBPK tablet Follow instructions on package. 21 tablet 0  ? Multiple Vitamin (MULTIVITAMIN) tablet Take 1 tablet by mouth once daily 100 tablet 3  ? omeprazole (PRILOSEC) 20 MG capsule Take 20 mg by mouth daily.    ? pravastatin (PRAVACHOL) 10 MG tablet Take 1 tablet (10 mg total) by mouth daily. 30 tablet 4  ? triamcinolone cream (KENALOG) 0.1 % APPLY  CREAM EXTERNALLY TO AFFECTED AREA(S) TWICE DAILY 30 g 0  ? Vitamin D, Ergocalciferol, (DRISDOL) 1.25 MG (50000 UNIT) CAPS capsule TAKE  CAPSULE BY MOUTH ONCE A WEEK 12 capsule 0  ? ?No current facility-administered medications on file prior to visit.  ? ? ?BP (!) 143/78   Pulse 95   Temp 98.3 ?F (36.8 ?C)   Resp 18   Ht 5\' 2"  (1.575 m)   Wt 164 lb 6.4 oz (74.6 kg)   SpO2 99%   BMI 30.07 kg/m?  ?  ?   ?Objective:  ? Physical Exam ? ?General ?Mental Status- Alert. General Appearance- Not in acute distress.  ? ?Skin ?General: Color- Normal Color. Moisture- Normal Moisture. ? ?Neck ?Carotid Arteries- Normal color. Moisture- Normal Moisture. No carotid bruits. No JVD. ? ?Chest and Lung Exam ?Auscultation: ?Breath Sounds:-Normal. ? ?Cardiovascular ?Auscultation:Rythm-  Regular. ?Murmurs & Other Heart Sounds:Auscultation of the heart reveals- No Murmurs. ? ? ?Neurologic ?Cranial Nerve exam:- CN III-XII intact(No nystagmus), symmetric smile. ?Strength:- 5/5 equal and symmetric strength both upper and lower extremities.  ? ?Heent- mild frontal and ethmoid sinus pressure. Tonsil hypertrophy mild  bilaterally.no exudate. ?  Tongue not swollen. Lips not swolllen. ?   ?Assessment & Plan:  ? ?Patient Instructions  ?Various symptoms quick onset. Considering infectious cause pharygisitis/sinus infection vs allergy. You expressed some concern for allergy.  ? ?Will rx azithromycin antibiotic, medrol dose pack and flonase nasal spray. ? ?Both strep and covid test negative. ? ?For next week for caution sake stop fish oil, probiotic and multivitamin based on proximity to symptom onset.  ? ?Follow up 7-10 days or sooner if needed.  ? ? ?Mackie Pai, PA-C  ?

## 2021-08-30 NOTE — Patient Instructions (Addendum)
Various symptoms quick onset. Considering infectious cause pharygitis/sinus infection vs allergy. You expressed some concern for allergy.  ? ?Will rx azithromycin antibiotic, medrol dose pack and flonase nasal spray. ? ?Both strep and covid test negative. ? ?For next week for caution sake stop fish oil, probiotic and multivitamin based on proximity to symptom onset.  ? ?Follow up 7-10 days or sooner if needed. ?

## 2021-11-15 ENCOUNTER — Encounter: Payer: Self-pay | Admitting: Family Medicine

## 2021-11-15 ENCOUNTER — Ambulatory Visit: Payer: Commercial Managed Care - PPO | Admitting: Family Medicine

## 2021-11-15 VITALS — BP 120/80 | HR 94 | Temp 97.9°F | Ht 62.0 in | Wt 163.0 lb

## 2021-11-15 DIAGNOSIS — M5416 Radiculopathy, lumbar region: Secondary | ICD-10-CM | POA: Diagnosis not present

## 2021-11-15 DIAGNOSIS — J029 Acute pharyngitis, unspecified: Secondary | ICD-10-CM | POA: Diagnosis not present

## 2021-11-15 DIAGNOSIS — H6591 Unspecified nonsuppurative otitis media, right ear: Secondary | ICD-10-CM

## 2021-11-15 LAB — POCT RAPID STREP A (OFFICE): Rapid Strep A Screen: NEGATIVE

## 2021-11-15 MED ORDER — METHYLPREDNISOLONE 4 MG PO TBPK: ORAL_TABLET | ORAL | 0 refills | Status: DC

## 2021-11-15 MED ORDER — DULOXETINE HCL 20 MG PO CPEP: 20.0000 mg | ORAL_CAPSULE | Freq: Every day | ORAL | 3 refills | Status: DC

## 2021-11-15 NOTE — Progress Notes (Signed)
Chief Complaint  Patient presents with   Sore Throat   Ear Pain    Back pain     Christine Griffith here for URI complaints.  Duration: 1 week  Associated symptoms: ear pain, runny/stuffy nose, and sore throat Denies: sinus pain, itchy watery eyes, ear drainage, wheezing, shortness of breath, myalgia, and fevers Treatment to date: allergy medication w Tylenol Sick contacts: No  Past Medical History:  Diagnosis Date   Chicken pox    Environmental allergies    GERD (gastroesophageal reflux disease)    Glaucoma (increased eye pressure)    Borderline   Seasonal allergies    Vitamin D deficiency     Objective BP 120/80   Pulse 94   Temp 97.9 F (36.6 C) (Oral)   Ht 5\' 2"  (1.575 m)   Wt 163 lb (73.9 kg)   SpO2 99%   BMI 29.81 kg/m  General: Awake, alert, appears stated age HEENT: AT, [place], ears patent b/l and TM neg on L, with serous fluid on the right, no bulging or erythema, nares patent w/o discharge, pharynx pink and without exudates, MMM Neck: No masses or asymmetry Heart: RRR Lungs: CTAB, no accessory muscle use MSK: TTP over the lumbar paraspinal musculature bilaterally, no SI joint TTP, poor hamstring flexibility bilaterally Neuro: Antalgic gait, patellar DTRs equal and symmetric bilaterally without clonus Psych: Age appropriate judgment and insight, normal mood and affect  Lumbar radiculopathy - Plan: DULoxetine (CYMBALTA) 20 MG capsule  Sore throat - Plan: POCT rapid strep A  Fluid level behind tympanic membrane of right ear - Plan: methylPREDNISolone (MEDROL DOSEPAK) 4 MG TBPK tablet  Chronic, uncontrolled.  Trial duloxetine 20 mg daily.  List of foods associated with low inflammation provided in paperwork.  Continue home exercise program.  Heat, ice, Tylenol.  She will follow-up with her pain specialist. Rapid strep is negative.  Tylenol as needed. Medrol Dosepak to open up the eustachian tube. Pt voiced understanding and agreement to the plan.   [place], DO 11/15/21 4:39 PM

## 2021-11-15 NOTE — Patient Instructions (Signed)
OK to continue your regular pain medications.   Ice/cold pack over area for 10-15 min twice daily.  OK to take Tylenol 1000 mg (2 extra strength tabs) or 975 mg (3 regular strength tabs) every 6 hours as needed.  Heat (pad or rice pillow in microwave) over affected area, 10-15 minutes twice daily.   Foods that may reduce pain: 1) Ginger 2) Blueberries 3) Salmon 4) Pumpkin seeds 5) dark chocolate 6) turmeric 7) tart cherries 8) virgin olive oil 9) chilli peppers 10) mint 11) krill oil  Let us know if you need anything.

## 2021-12-05 ENCOUNTER — Other Ambulatory Visit: Payer: Self-pay | Admitting: Family Medicine

## 2021-12-05 DIAGNOSIS — E785 Hyperlipidemia, unspecified: Secondary | ICD-10-CM

## 2021-12-22 LAB — HM MAMMOGRAPHY: HM Mammogram: NORMAL (ref 0–4)

## 2021-12-23 ENCOUNTER — Encounter: Payer: Self-pay | Admitting: Family Medicine

## 2021-12-25 ENCOUNTER — Other Ambulatory Visit: Payer: Self-pay | Admitting: Family Medicine

## 2021-12-25 DIAGNOSIS — E785 Hyperlipidemia, unspecified: Secondary | ICD-10-CM

## 2022-02-13 ENCOUNTER — Ambulatory Visit (INDEPENDENT_AMBULATORY_CARE_PROVIDER_SITE_OTHER): Payer: Commercial Managed Care - PPO | Admitting: Family Medicine

## 2022-02-13 ENCOUNTER — Encounter: Payer: Self-pay | Admitting: Family Medicine

## 2022-02-13 ENCOUNTER — Other Ambulatory Visit: Payer: Self-pay | Admitting: Family Medicine

## 2022-02-13 VITALS — BP 128/80 | HR 75 | Temp 98.2°F | Ht 62.0 in | Wt 157.2 lb

## 2022-02-13 DIAGNOSIS — E785 Hyperlipidemia, unspecified: Secondary | ICD-10-CM | POA: Diagnosis not present

## 2022-02-13 DIAGNOSIS — Z0000 Encounter for general adult medical examination without abnormal findings: Secondary | ICD-10-CM

## 2022-02-13 DIAGNOSIS — M5416 Radiculopathy, lumbar region: Secondary | ICD-10-CM

## 2022-02-13 DIAGNOSIS — E559 Vitamin D deficiency, unspecified: Secondary | ICD-10-CM

## 2022-02-13 DIAGNOSIS — R7303 Prediabetes: Secondary | ICD-10-CM | POA: Diagnosis not present

## 2022-02-13 LAB — CBC
HCT: 37.3 % (ref 36.0–46.0)
Hemoglobin: 12.1 g/dL (ref 12.0–15.0)
MCHC: 32.4 g/dL (ref 30.0–36.0)
MCV: 89.1 fl (ref 78.0–100.0)
Platelets: 351 10*3/uL (ref 150.0–400.0)
RBC: 4.18 Mil/uL (ref 3.87–5.11)
RDW: 14.3 % (ref 11.5–15.5)
WBC: 7.2 10*3/uL (ref 4.0–10.5)

## 2022-02-13 LAB — COMPREHENSIVE METABOLIC PANEL
ALT: 13 U/L (ref 0–35)
AST: 12 U/L (ref 0–37)
Albumin: 3.9 g/dL (ref 3.5–5.2)
Alkaline Phosphatase: 86 U/L (ref 39–117)
BUN: 12 mg/dL (ref 6–23)
CO2: 28 mEq/L (ref 19–32)
Calcium: 8.7 mg/dL (ref 8.4–10.5)
Chloride: 103 mEq/L (ref 96–112)
Creatinine, Ser: 0.89 mg/dL (ref 0.40–1.20)
GFR: 73.1 mL/min (ref >60.00)
Glucose, Bld: 82 mg/dL (ref 70–99)
Potassium: 4.1 mEq/L (ref 3.5–5.1)
Sodium: 138 mEq/L (ref 135–145)
Total Bilirubin: 0.5 mg/dL (ref 0.2–1.2)
Total Protein: 6.8 g/dL (ref 6.0–8.3)

## 2022-02-13 LAB — HEMOGLOBIN A1C: Hgb A1c MFr Bld: 6.1 % (ref 4.6–6.5)

## 2022-02-13 LAB — LIPID PANEL
Cholesterol: 196 mg/dL (ref 0–200)
HDL: 47.7 mg/dL (ref >39.00)
LDL Cholesterol: 134 mg/dL — ABNORMAL HIGH (ref 0–99)
NonHDL: 148.17
Total CHOL/HDL Ratio: 4
Triglycerides: 70 mg/dL (ref 0.0–149.0)
VLDL: 14 mg/dL (ref 0.0–40.0)

## 2022-02-13 LAB — VITAMIN D 25 HYDROXY (VIT D DEFICIENCY, FRACTURES): VITD: 20.59 ng/mL — ABNORMAL LOW (ref 30.00–100.00)

## 2022-02-13 MED ORDER — VITAMIN D (ERGOCALCIFEROL) 1.25 MG (50000 UNIT) PO CAPS: 50000.0000 [IU] | ORAL_CAPSULE | ORAL | 0 refills | Status: DC

## 2022-02-13 MED ORDER — DULOXETINE HCL 20 MG PO CPEP: 20.0000 mg | ORAL_CAPSULE | Freq: Every day | ORAL | 2 refills | Status: DC

## 2022-02-13 MED ORDER — PRAVASTATIN SODIUM 10 MG PO TABS: 10.0000 mg | ORAL_TABLET | Freq: Every day | ORAL | 2 refills | Status: DC

## 2022-02-13 NOTE — Patient Instructions (Addendum)
Give [place] 2-3 business days to get the results of your labs back.   Keep the diet clean and stay active.  I recommend getting the flu shot in mid October. This suggestion would change if the CDC comes out with a different recommendation.   Please get me a copy of your advanced directive form at your convenience.   Try to get 7-9 hrs of sleep nightly.   Foods that may reduce pain: 1) Ginger 2) Blueberries 3) Salmon 4) Pumpkin seeds 5) dark chocolate 6) turmeric 7) tart cherries 8) virgin olive oil 9) chilli peppers 10) mint 11) krill oil   Let us know if you need anything.

## 2022-02-13 NOTE — Progress Notes (Signed)
Chief Complaint  Patient presents with   Annual Exam     Well Woman Christine Griffith is here for a complete physical.   Her last physical was >1 year ago.  Current diet: in general, diet is fair. Current exercise: walking, active in garden. Weight is down a little and she confirms fatigue out of ordinary. Seatbelt? Yes Advanced directive? No  Health Maintenance Mammogram- Yes Colon cancer screening-Yes Shingrix- Yes Tetanus- Yes Hep C screening- Yes HIV screening- Yes  Past Medical History:  Diagnosis Date   Chicken pox    Environmental allergies    GERD (gastroesophageal reflux disease)    Glaucoma (increased eye pressure)    Borderline   Seasonal allergies    Vitamin D deficiency      Past Surgical History:  Procedure Laterality Date   ABDOMINAL HYSTERECTOMY  2014   BREAST BIOPSY Right    stereo   CESAREAN SECTION     WISDOM TOOTH EXTRACTION      Medications  Current Outpatient Medications on File Prior to Visit  Medication Sig Dispense Refill   Ascorbic Acid (VITAMIN C) 100 MG tablet Take 100 mg by mouth daily.     calcium carbonate (TUMS - DOSED IN MG ELEMENTAL CALCIUM) 500 MG chewable tablet Chew 1 tablet by mouth daily.     celecoxib (CELEBREX) 100 MG capsule TAKE 1 CAPSULE BY MOUTH TWICE DAILY AS NEEDED 60 capsule 0   clobetasol cream (TEMOVATE) 0.05 % Apply 1 application topically 2 (two) times daily. 30 g 0   co-enzyme Q-10 50 MG capsule Take 100 mg by mouth.     COVID-19 mRNA Vac-TriS, Pfizer, (PFIZER-BIONT COVID-19 VAC-TRIS) SUSP injection Inject into the muscle. 0.3 mL 0   fish oil-omega-3 fatty acids 1000 MG capsule Take 1 g by mouth every other day.     fluticasone (FLONASE) 50 MCG/ACT nasal spray Place 2 sprays into both nostrils daily. 16 g 1   gabapentin (NEURONTIN) 100 MG capsule Take 1 capsule (100 mg total) by mouth 3 (three) times daily. 90 capsule 2   Lactobacillus (PROBIOTIC ACIDOPHILUS PO) Take by mouth.     loratadine (CLARITIN) 10 MG  tablet TAKE 1 TABLET BY MOUTH ONCE DAILY AS NEEDED 45 tablet 0   Multiple Vitamin (MULTIVITAMIN) tablet Take 1 tablet by mouth once daily 100 tablet 3   omeprazole (PRILOSEC) 20 MG capsule Take 20 mg by mouth daily.     tiZANidine (ZANAFLEX) 4 MG tablet Take 4 mg by mouth every 8 (eight) hours as needed.     Allergies Allergies  Allergen Reactions   Codeine Nausea And Vomiting   Prednisone Nausea And Vomiting   Hydrocodone Nausea And Vomiting    Review of Systems: Constitutional:  no unexpected weight changes Eye:  no recent significant change in vision Ear/Nose/Mouth/Throat:  Ears:  no recent change in hearing Nose/Mouth/Throat:  no complaints of nasal congestion, no sore throat Cardiovascular: no chest pain Respiratory:  no shortness of breath Gastrointestinal:  no abdominal pain, no change in bowel habits GU:  Female: negative for dysuria or pelvic pain Musculoskeletal/Extremities:  no new pain of the joints Integumentary (Skin/Breast):  no new abnormal skin lesions reported Neurologic:  no headaches Endocrine:  denies fatigue  Exam BP 128/80   Pulse 75   Temp 98.2 F (36.8 C) (Oral)   Ht 5\' 2"  (1.575 m)   Wt 157 lb 4 oz (71.3 kg)   SpO2 97%   BMI 28.76 kg/m  General:  well developed,  well nourished, in no apparent distress Skin:  no significant moles, warts, or growths Head:  no masses, lesions, or tenderness Eyes:  pupils equal and round, sclera anicteric without injection Ears:  canals without lesions, TMs shiny without retraction, no obvious effusion, no erythema Nose:  nares patent, septum midline, mucosa normal, and no drainage or sinus tenderness Throat/Pharynx:  lips and gingiva without lesion; tongue and uvula midline; non-inflamed pharynx; no exudates or postnasal drainage Neck: neck supple without adenopathy, thyromegaly, or masses Lungs:  clear to auscultation, breath sounds equal bilaterally, no respiratory distress Cardio:  regular rate and rhythm, no LE  edema Abdomen:  abdomen soft, nontender; bowel sounds normal; no masses or organomegaly Genital: Defer to GYN Musculoskeletal:  symmetrical muscle groups noted without atrophy or deformity Extremities:  no clubbing, cyanosis, or edema, no deformities, no skin discoloration Neuro:  gait normal; deep tendon reflexes normal and symmetric Psych: well oriented with normal range of affect and appropriate judgment/insight  Assessment and Plan  Well adult exam - Plan: CBC, Comprehensive metabolic panel, Lipid panel  Prediabetes - Plan: Hemoglobin A1c  Hyperlipidemia, unspecified hyperlipidemia type - Plan: pravastatin (PRAVACHOL) 10 MG tablet  Lumbar radiculopathy - Plan: DULoxetine (CYMBALTA) 20 MG capsule   Well 56 y.o. female. Counseled on diet and exercise. Advanced directive form provided today.  Other orders as above. Follow up in 6 mo or prn. The patient voiced understanding and agreement to the plan.  Jilda Roche [place], DO 02/13/22 8:37 AM

## 2022-02-22 ENCOUNTER — Telehealth: Payer: Self-pay | Admitting: Family Medicine

## 2022-02-22 NOTE — Telephone Encounter (Signed)
Patient dropped off FMLA form in the office, she has appt on 09/25. Forms placed in provider's box.

## 2022-02-23 NOTE — Telephone Encounter (Signed)
Form placed in [employer] basket

## 2022-02-27 ENCOUNTER — Telehealth: Payer: Self-pay | Admitting: Family Medicine

## 2022-02-27 ENCOUNTER — Encounter: Payer: Self-pay | Admitting: Family Medicine

## 2022-02-27 ENCOUNTER — Ambulatory Visit: Payer: Commercial Managed Care - PPO | Admitting: Family Medicine

## 2022-02-27 VITALS — BP 122/80 | HR 80 | Temp 98.0°F | Ht 62.0 in | Wt 157.4 lb

## 2022-02-27 DIAGNOSIS — M5416 Radiculopathy, lumbar region: Secondary | ICD-10-CM

## 2022-02-27 DIAGNOSIS — R7303 Prediabetes: Secondary | ICD-10-CM

## 2022-02-27 MED ORDER — METFORMIN HCL ER 500 MG PO TB24: 500.0000 mg | ORAL_TABLET | Freq: Every day | ORAL | 5 refills | Status: DC
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
## 2022-02-27 NOTE — Telephone Encounter (Signed)
PCP completed form today 02/27/2022. Faxed completed form  Called the patient to pickup a copy of completed form.

## 2022-02-27 NOTE — Telephone Encounter (Signed)
Pt called stating she would like the referral that was put in today (9.25.23) by Dr. Nani Ravens to be routed to an office in [facility] if possible as she would like to have it closer to home.

## 2022-02-27 NOTE — Patient Instructions (Signed)
Heat (pad or rice pillow in microwave) over affected area, 10-15 minutes twice daily.   Ice/cold pack over area for 10-15 min twice daily.  OK to take Tylenol 1000 mg (2 extra strength tabs) or 975 mg (3 regular strength tabs) every 6 hours as needed.  If you do not hear anything about your referral in the next 1-2 weeks, call our office and ask for an update.  Let us know if you need anything.  Mid-Back Strain Rehab It is normal to feel mild stretching, pulling, tightness, or discomfort as you do these exercises, but you should stop right away if you feel sudden pain or your pain gets worse.  Stretching and range of motion exercises This exercise warms up your muscles and joints and improves the movement and flexibility of your back and shoulders. This exercise also help to relieve pain. Exercise A: Chest and spine stretch  Lie down on your back on a firm surface. Roll a towel or a small blanket so it is about 4 inches (10 cm) in diameter. Put the towel lengthwise under the middle of your back so it is under your spine, but not under your shoulder blades. To increase the stretch, you may put your hands behind your head and let your elbows fall to your sides. Hold for 30 seconds. Repeat exercise 2 times. Complete this exercise 3 times per week. Strengthening exercises These exercises build strength and endurance in your back and your shoulder blade muscles. Endurance is the ability to use your muscles for a long time, even after they get tired. Exercise C: Straight arm rows (shoulder extension) Stand with your feet shoulder width apart. Secure an exercise band to a stable object in front of you so the band is at or above shoulder height. Hold one end of the exercise band in each hand. Straighten your elbows and lift your hands up to shoulder height. Step back, away from the secured end of the exercise band, until the band stretches. Squeeze your shoulder blades together and pull your  hands down to the sides of your thighs. Stop when your hands are straight down by your sides. Do not let your hands go behind your body. Hold for 2 seconds. Slowly return to the starting position. Repeat 2 times. Complete this exercise 3 times per week. Exercise D: Shoulder external rotation, prone Lie on your abdomen on a firm bed so your left / right forearm hangs over the edge of the bed and your upper arm is on the bed, straight out from your body. Your elbow should be bent. Your palm should be facing your feet. If instructed, hold a 2-5 lb weight in your hand. Squeeze your shoulder blade toward the middle of your back. Do not let your shoulder lift toward your ear. Keep your elbow bent in an "L" shape (90 degrees) while you slowly move your forearm up toward the ceiling. Move your forearm up to the height of the bed, toward your head. Your upper arm should not move. At the top of the movement, your palm should face the floor. Hold for 1 second. Slowly return to the starting position and relax your muscles. Repeat 2 times. Complete this exercise 3 times per week. Exercise E: Scapular retraction and external rotation, rowing  Sit in a stable chair without armrests, or stand. Secure an exercise band to a stable object in front of you so it is at shoulder height. Hold one end of the exercise band in each hand.  Bring your arms out straight in front of you. Step back, away from the secured end of the exercise band, until the band stretches. Pull the band backward. As you do this, bend your elbows and squeeze your shoulder blades together, but avoid letting the rest of your body move. Do not let your shoulders lift up toward your ears. Stop when your elbows are at your sides or slightly behind your body. Hold for 1 second1. Slowly straighten your arms to return to the starting position. Repeat 2 times. Complete this exercise 3 times per week. Posture and body mechanics  Body mechanics  refers to the movements and positions of your body while you do your daily activities. Posture is part of body mechanics. Good posture and healthy body mechanics can help to relieve stress in your body's tissues and joints. Good posture means that your spine is in its natural S-curve position (your spine is neutral), your shoulders are pulled back slightly, and your head is not tipped forward. The following are general guidelines for applying improved posture and body mechanics to your everyday activities. Standing  When standing, keep your spine neutral and your feet about hip-width apart. Keep a slight bend in your knees. Your ears, shoulders, and hips should line up. When you do a task in which you lean forward while standing in one place for a long time, place one foot up on a stable object that is 2-4 inches (5-10 cm) high, such as a footstool. This helps keep your spine neutral. Sitting  When sitting, keep your spine neutral and keep your feet flat on the floor. Use a footrest, if necessary, and keep your thighs parallel to the floor. Avoid rounding your shoulders, and avoid tilting your head forward. When working at a desk or a computer, keep your desk at a height where your hands are slightly lower than your elbows. Slide your chair under your desk so you are close enough to maintain good posture. When working at a computer, place your monitor at a height where you are looking straight ahead and you do not have to tilt your head forward or downward to look at the screen. Resting  When lying down and resting, avoid positions that are most painful for you. If you have pain with activities such as sitting, bending, stooping, or squatting (flexion-based activities), lie in a position in which your body does not bend very much. For example, avoid curling up on your side with your arms and knees near your chest (fetal position). If you have pain with activities such as standing for a long time or  reaching with your arms (extension-based activities), lie with your spine in a neutral position and bend your knees slightly. Try the following positions: Lying on your side with a pillow between your knees. Lying on your back with a pillow under your knees.  Lifting  When lifting objects, keep your feet at least shoulder-width apart and tighten your abdominal muscles. Bend your knees and hips and keep your spine neutral. It is important to lift using the strength of your legs, not your back. Do not lock your knees straight out. Always ask for help to lift heavy or awkward objects. Make sure you discuss any questions you have with your health care provider. Document Released: 05/22/2005 Document Revised: 01/27/2016 Document Reviewed: 03/03/2015 Elsevier Interactive Patient Education  [employer].

## 2022-03-28 ENCOUNTER — Telehealth (INDEPENDENT_AMBULATORY_CARE_PROVIDER_SITE_OTHER): Payer: Commercial Managed Care - PPO | Admitting: Family Medicine

## 2022-03-28 ENCOUNTER — Telehealth: Payer: Self-pay | Admitting: Family Medicine

## 2022-03-28 ENCOUNTER — Encounter: Payer: Self-pay | Admitting: Family Medicine

## 2022-03-28 DIAGNOSIS — U071 COVID-19: Secondary | ICD-10-CM

## 2022-03-28 MED ORDER — MOLNUPIRAVIR EUA 200MG CAPSULE: 4.0000 | ORAL_CAPSULE | Freq: Two times a day (BID) | ORAL | 0 refills | Status: AC

## 2022-03-28 MED ORDER — PROMETHAZINE-DM 6.25-15 MG/5ML PO SYRP: 5.0000 mL | ORAL_SOLUTION | Freq: Four times a day (QID) | ORAL | 0 refills | Status: DC | PRN

## 2022-03-28 NOTE — Telephone Encounter (Signed)
Patient tested positive for covid today at work. She wants to know if there is anything that can be given to help with symptoms. Advised that Dr. Nani Ravens will probably want to have an appt with her. Patient said she will also send mychart message. Patient's symptoms are sore throat, body aches, hot/cold chills, but no fever. Please call to advise.

## 2022-03-28 NOTE — Progress Notes (Signed)
Chief Complaint  Patient presents with   Covid Positive   Cough   Sore Throat    Christine Griffith here for URI complaints. Due to COVID-19 pandemic, we are interacting via web portal for an electronic face-to-face visit. I verified patient's ID using 2 identifiers. Patient agreed to proceed with visit via this method. Patient is at home, I am at office. Patient and I are present for visit.   Duration: 2 d Associated symptoms: sinus congestion, rhinorrhea, sore throat, myalgia, and chills, coughing Denies: sinus pain, itchy watery eyes, ear pain, ear drainage, wheezing, shortness of breath, and N/V/D, loss of taste/smell Treatment to date: allergy meds Sick contacts: Yes; someone else was sick at work Tested + for covid today.   Past Medical History:  Diagnosis Date   Chicken pox    Environmental allergies    GERD (gastroesophageal reflux disease)    Glaucoma (increased eye pressure)    Borderline   Seasonal allergies    Vitamin D deficiency     Objective No conversational dyspnea Age appropriate judgment and insight Nml affect and mood  COVID-19 - Plan: molnupiravir EUA (LAGEVRIO) 200 mg CAPS capsule, promethazine-dextromethorphan (PROMETHAZINE-DM) 6.25-15 MG/5ML syrup  Continue to push fluids, practice good hand hygiene, cover mouth when coughing. Discussed CDC quarantining guidelines.  F/u prn. If starting to experience irreplaceable fluid loss, shaking, or shortness of breath, seek immediate care. Pt voiced understanding and agreement to the plan.  [place], DO 03/28/22 1:29 PM

## 2022-03-28 NOTE — Telephone Encounter (Signed)
Called and scheduled a video visit with PCP.

## 2022-04-04 ENCOUNTER — Other Ambulatory Visit: Payer: Self-pay | Admitting: Family Medicine

## 2022-04-04 ENCOUNTER — Telehealth: Payer: Self-pay | Admitting: Family Medicine

## 2022-04-04 MED ORDER — METHYLPREDNISOLONE 4 MG PO TBPK: ORAL_TABLET | ORAL | 0 refills | Status: DC

## 2022-04-04 NOTE — Telephone Encounter (Signed)
She sounded pretty miserable and would appreciate either or both

## 2022-04-04 NOTE — Telephone Encounter (Signed)
She will try the medrol and let us know in a couple days if not feeling any better.

## 2022-04-04 NOTE — Telephone Encounter (Signed)
She stated the medication given to her for Covid made her sick.(She felt like it was too much medication to take and cut it in half, but did not like how it made her feel).  She said that now she is having ear and throat pain. She would like something else sent in that would help these symptoms.

## 2022-04-04 NOTE — Telephone Encounter (Signed)
I think she's done well on Medrol Dosepaks in the past or Flonase.

## 2022-04-04 NOTE — Telephone Encounter (Signed)
Patient would like a call back to discuss getting another medication sent since the first one given to her did not work. Please advise.

## 2022-06-30 ENCOUNTER — Ambulatory Visit: Payer: Commercial Managed Care - PPO | Admitting: Family Medicine

## 2022-07-25 ENCOUNTER — Encounter: Payer: Self-pay | Admitting: Family Medicine

## 2022-07-25 ENCOUNTER — Ambulatory Visit: Payer: Commercial Managed Care - PPO | Admitting: Family Medicine

## 2022-07-25 VITALS — BP 124/80 | HR 93 | Temp 98.2°F | Ht 62.0 in | Wt 155.5 lb

## 2022-07-25 DIAGNOSIS — E785 Hyperlipidemia, unspecified: Secondary | ICD-10-CM | POA: Diagnosis not present

## 2022-07-25 DIAGNOSIS — H6991 Unspecified Eustachian tube disorder, right ear: Secondary | ICD-10-CM | POA: Diagnosis not present

## 2022-07-25 DIAGNOSIS — R7303 Prediabetes: Secondary | ICD-10-CM

## 2022-07-25 DIAGNOSIS — R319 Hematuria, unspecified: Secondary | ICD-10-CM

## 2022-07-25 LAB — POC URINALSYSI DIPSTICK (AUTOMATED)
Bilirubin, UA: NEGATIVE
Glucose, UA: NEGATIVE
Ketones, UA: NEGATIVE
Leukocytes, UA: NEGATIVE
Nitrite, UA: NEGATIVE
Protein, UA: NEGATIVE
Spec Grav, UA: 1.02 (ref 1.010–1.025)
Urobilinogen, UA: 0.2 E.U./dL
pH, UA: 6 (ref 5.0–8.0)

## 2022-07-25 MED ORDER — FLUCONAZOLE 150 MG PO TABS: ORAL_TABLET | ORAL | 0 refills | Status: DC

## 2022-07-25 MED ORDER — SULFAMETHOXAZOLE-TRIMETHOPRIM 800-160 MG PO TABS: 1.0000 | ORAL_TABLET | Freq: Two times a day (BID) | ORAL | 0 refills | Status: AC

## 2022-07-25 MED ORDER — METHYLPREDNISOLONE 4 MG PO TBPK: ORAL_TABLET | ORAL | 0 refills | Status: DC

## 2022-07-25 MED ORDER — FLUTICASONE PROPIONATE 50 MCG/ACT NA SUSP: 2.0000 | Freq: Every day | NASAL | 1 refills | Status: DC

## 2022-07-25 NOTE — Patient Instructions (Addendum)
Keep the diet clean and stay active.  Go back on Flonase.  Give [place] 2-3 business days to get the results of your labs back.   Stay hydrated.   Warning signs/symptoms: Uncontrollable nausea/vomiting, fevers, worsening symptoms despite treatment, confusion.  Give [place] around 2 business days to get culture back to you.  Take Metamucil or Benefiber daily.  Let us know if you need anything.

## 2022-07-25 NOTE — Progress Notes (Signed)
Chief Complaint  Patient presents with   Spasms    Ears and nose sensitive/full Urine cloudy     Subjective: Patient is a 57 y.o. female here for pain.  2 mo of congestion and drainage. Getting thicker over past mo. She has associated ear fullness, runny/stuff nose, sinus pressure. No itchy/water eyes, ST, fevers, coughing, new myalgias, wheezing, SOB. Has take Allergy meds w Tylenol.   Cloudy urine and fatigue over past 4 days. She has associated R flank pain, 7/10 pain. No hx of kidney stones. Associated retention and hesitancy. No bleeding, dc, freq, urgency, incontinence, fevers, lower abd pain, constipation.   Past Medical History:  Diagnosis Date   Chicken pox    Environmental allergies    GERD (gastroesophageal reflux disease)    Glaucoma (increased eye pressure)    Borderline   Seasonal allergies    Vitamin D deficiency     Objective: BP 124/80 (BP Location: Left Arm, Patient Position: Sitting, Cuff Size: Normal)   Pulse 93   Temp 98.2 F (36.8 C) (Oral)   Ht 5' 2"$  (1.575 m)   Wt 155 lb 8 oz (70.5 kg)   SpO2 99%   BMI 28.44 kg/m  General: Awake, appears stated age 57: Right TM slightly retracted, left TM negative, canals are patent without otorrhea, mild fullness to palpation over the maxillary sinuses bilaterally, nares are patent without rhinorrhea or edema, PERRLA, sclera white, MMM, clear drainage noted in the pharynx, no tonsillar exudate or erythema Heart: RRR, no LE edema Lungs: CTAB, no rales, wheezes or rhonchi. No accessory muscle use Abd: BS+, S, mild TTP over the right portion of the abdomen, ND; no TTP over McBurney's point MSK: + TTP over the right CVA region, no TTP over the left CVA region Psych: Age appropriate judgment and insight, normal affect and mood  Assessment and Plan: Hematuria, unspecified type - Plan: POCT Urinalysis Dipstick (Automated), Urine Culture, Urine Microscopic Only, sulfamethoxazole-trimethoprim (BACTRIM DS) 800-160 MG  tablet, fluconazole (DIFLUCAN) 150 MG tablet  Dysfunction of right eustachian tube - Plan: methylPREDNISolone (MEDROL DOSEPAK) 4 MG TBPK tablet, fluticasone (FLONASE) 50 MCG/ACT nasal spray  Hyperlipidemia, unspecified hyperlipidemia type - Plan: Lipid panel, Comprehensive metabolic panel, CANCELED: Comprehensive metabolic panel  Prediabetes - Plan: Hemoglobin A1c  Trace blood noted in urine.  Will send for microscopy and culture.  Will empirically treat for a UTI with possible extension into the right kidney.  7 days of Bactrim twice daily.  Diflucan should she develop a yeast infection. Will see if Bactrim affects this as well.  Medrol Dosepak for the eustachian tube.  She needs to go back on Flonase as I do think this could help with her more chronic symptoms, possibly representing allergies. Follow-up on lipids. Follow-up in prediabetes. The patient voiced understanding and agreement to the plan.  I spent 35 minutes with the patient discussing the above plans in addition to reviewing her chart on the same day of the visit.  [place], DO 07/25/22  4:35 PM

## 2022-07-26 LAB — URINALYSIS, MICROSCOPIC ONLY
RBC / HPF: NONE SEEN (ref 0–?)
WBC, UA: NONE SEEN (ref 0–?)

## 2022-07-26 LAB — URINE CULTURE
MICRO NUMBER:: 14589478
Result:: NO GROWTH
SPECIMEN QUALITY:: ADEQUATE

## 2022-07-28 ENCOUNTER — Other Ambulatory Visit (INDEPENDENT_AMBULATORY_CARE_PROVIDER_SITE_OTHER): Payer: Commercial Managed Care - PPO

## 2022-07-28 ENCOUNTER — Ambulatory Visit: Payer: Commercial Managed Care - PPO | Admitting: Family Medicine

## 2022-07-28 ENCOUNTER — Encounter: Payer: Self-pay | Admitting: Family Medicine

## 2022-07-28 DIAGNOSIS — R7303 Prediabetes: Secondary | ICD-10-CM | POA: Diagnosis not present

## 2022-07-28 DIAGNOSIS — E785 Hyperlipidemia, unspecified: Secondary | ICD-10-CM

## 2022-07-28 LAB — COMPREHENSIVE METABOLIC PANEL
ALT: 13 U/L (ref 0–35)
AST: 13 U/L (ref 0–37)
Albumin: 4.4 g/dL (ref 3.5–5.2)
Alkaline Phosphatase: 87 U/L (ref 39–117)
BUN: 17 mg/dL (ref 6–23)
CO2: 29 mEq/L (ref 19–32)
Calcium: 9.2 mg/dL (ref 8.4–10.5)
Chloride: 101 mEq/L (ref 96–112)
Creatinine, Ser: 1.11 mg/dL (ref 0.40–1.20)
GFR: 55.9 mL/min — ABNORMAL LOW (ref >60.00)
Glucose, Bld: 86 mg/dL (ref 70–99)
Potassium: 4.1 mEq/L (ref 3.5–5.1)
Sodium: 139 mEq/L (ref 135–145)
Total Bilirubin: 0.7 mg/dL (ref 0.2–1.2)
Total Protein: 7.2 g/dL (ref 6.0–8.3)

## 2022-07-28 LAB — LIPID PANEL
Cholesterol: 237 mg/dL — ABNORMAL HIGH (ref 0–200)
HDL: 51.8 mg/dL (ref >39.00)
LDL Cholesterol: 172 mg/dL — ABNORMAL HIGH (ref 0–99)
NonHDL: 185.49
Total CHOL/HDL Ratio: 5
Triglycerides: 69 mg/dL (ref 0.0–149.0)
VLDL: 13.8 mg/dL (ref 0.0–40.0)

## 2022-07-28 LAB — HEMOGLOBIN A1C: Hgb A1c MFr Bld: 5.9 % (ref 4.6–6.5)

## 2022-08-02 ENCOUNTER — Other Ambulatory Visit: Payer: Self-pay | Admitting: Family Medicine

## 2022-08-02 DIAGNOSIS — E785 Hyperlipidemia, unspecified: Secondary | ICD-10-CM

## 2022-08-02 MED ORDER — EZETIMIBE 10 MG PO TABS: 10.0000 mg | ORAL_TABLET | Freq: Every day | ORAL | 3 refills | Status: DC

## 2022-10-26 LAB — HM PAP SMEAR

## 2022-12-13 ENCOUNTER — Ambulatory Visit (INDEPENDENT_AMBULATORY_CARE_PROVIDER_SITE_OTHER): Payer: Commercial Managed Care - PPO | Admitting: Family Medicine

## 2022-12-13 ENCOUNTER — Encounter: Payer: Self-pay | Admitting: Family Medicine

## 2022-12-13 VITALS — BP 131/80 | HR 78 | Temp 98.5°F | Ht 62.0 in | Wt 155.2 lb

## 2022-12-13 DIAGNOSIS — M791 Myalgia, unspecified site: Secondary | ICD-10-CM

## 2022-12-13 MED ORDER — METHYLPREDNISOLONE ACETATE 80 MG/ML IJ SUSP
80.0000 mg | Freq: Once | INTRAMUSCULAR | Status: AC
  Administered 2022-12-13: 80 mg via INTRAMUSCULAR

## 2022-12-13 NOTE — Patient Instructions (Addendum)
Ice/cold pack over area for 10-15 min twice daily.  Heat (pad or rice pillow in microwave) over affected area, 10-15 minutes twice daily.   If you start having exertional chest pain, I would like to know.   Let us know if you need anything.

## 2022-12-13 NOTE — Progress Notes (Signed)
Chief Complaint  Patient presents with   Muscle Pain   Spasms    Not taking Metformin, should she restart.     Subjective: Patient is a 57 y.o. female here for back pain.  Discussed the use of AI scribe software for clinical note transcription with the patient, who gave verbal consent to proceed.  History of Present Illness   The patient, with a history of chronic muscle pain, presents with severe muscle ache and pain that started after a physically demanding shift at work. The pain was so severe that she was unable to work the following day. The patient describes the pain as a result of repeated bending and lifting, which aggravated her muscles to an extreme degree. The pain was so severe that she was unable to sleep and had difficulty moving.  The patient also reports a new symptom of numbness on one side of her body, which she describes as being more pronounced than the other side.  She also experienced a sensation of tightness in their throat, which they found concerning. The patient attempted to manage the pain with over-the-counter pain medication, heat, and muscle work, but found no relief.  The patient also reports noticing some bruising on her thigh, but is unsure of its origin. They have been experiencing difficulty with range of motion, particularly in her arm, which she attributes to the muscle pain. They also report dropping things more frequently than usual, which they find concerning.  The patient has been attending physical therapy and has tried massage therapy, but has found little relief.  She reports that her pain is constant and that she has difficulty walking due to the severity of the pain.       Past Medical History:  Diagnosis Date   Chicken pox    Environmental allergies    GERD (gastroesophageal reflux disease)    Glaucoma (increased eye pressure)    Borderline   Seasonal allergies    Vitamin D deficiency     Objective: BP 131/80 (BP Location: Left Arm,  Patient Position: Sitting, Cuff Size: Normal)   Pulse 78   Temp 98.5 F (36.9 C) (Oral)   Ht 5\' 2"  (1.575 m)   Wt 155 lb 4 oz (70.4 kg)   SpO2 97%   BMI 28.40 kg/m  General: Awake, appears stated age Heart: RRR, no LE edema Lungs: CTAB, no rales, wheezes or rhonchi. No accessory muscle use MSK: TTP over the right lumbar paraspinal musculature and erector spinae muscle group, right oblique musculature, trapezius musculature bilaterally, left upper chest wall region Neuro: Negative Spurling's bilaterally, 5/5 strength in the upper extremities bilaterally, 2/4 patellar reflexes bilaterally without clonus, 2/4 biceps reflex bilaterally without clonus; gait is antalgic Psych: Age appropriate judgment and insight, normal affect and mood  Assessment and Plan: Muscle pain - Plan: methylPREDNISolone acetate (DEPO-MEDROL) injection 80 mg  Depo-Medrol injection IM 80 mg today.  Heat, ice, Tylenol.  Continue home regimen from physical therapy and her pain management doctor.  I do not think her chest pain is cardiac in origin.  We did consider an EKG which she politely declined given the out-of-pocket cost of $500.  Based off of her history and physical exam, this is unlikely to be related to her heart.  She will let me know if anything changes, including having exertional symptoms. The patient voiced understanding and agreement to the plan.  I spent 30 minutes with the patient discussing the above plan in addition to reviewing her chart on  the same date of visit.  Jilda Roche [place], DO 12/13/22  4:47 PM

## 2022-12-28 LAB — HM MAMMOGRAPHY

## 2023-01-25 ENCOUNTER — Telehealth: Payer: Self-pay | Admitting: Family Medicine

## 2023-01-25 NOTE — Telephone Encounter (Signed)
Pt dropped off FMLA paperwork to be filled out by provider, Pt would like to have document to be faxed to [phone] and to make a copy of documents and call pt to pick up when ready. Document put at front office tray under providers name.

## 2023-01-26 ENCOUNTER — Other Ambulatory Visit: Payer: Commercial Managed Care - PPO

## 2023-01-29 NOTE — Telephone Encounter (Signed)
PCP completed Form faxed Received confirmation Patient will pickup at the front her copy

## 2023-01-31 ENCOUNTER — Telehealth: Payer: Self-pay | Admitting: Family Medicine

## 2023-01-31 NOTE — Telephone Encounter (Signed)
Caller Name Dotty Boatright Caller Phone Number [phone] Patient Name Christine Griffith Patient DOB na Call Type Message Only Information Provided Reason for Call Request for General Office Information Initial Comment Caller stated that they are lightheaded, needs to make an appt, no other current symptoms, was sent over by service, declined triage. Disp. Time Disposition Final User 01/31/2023 2:16:33 PM General Information Provided Yes McGehee, April Call Closed By: April McGehee Transaction Date/Time: 01/31/2023 2:13:11 PM (ET)

## 2023-01-31 NOTE — Telephone Encounter (Signed)
Appt is scheduled for 02/09/23

## 2023-01-31 NOTE — Telephone Encounter (Signed)
Pt made ana ppt next week for lightheadedness and was triaged for nurse eval.

## 2023-01-31 NOTE — Telephone Encounter (Signed)
Pt called back and was scheduled for 8.29.24 w/ Ladona Ridgel.

## 2023-02-01 ENCOUNTER — Ambulatory Visit: Payer: Commercial Managed Care - PPO | Admitting: Family Medicine

## 2023-02-01 ENCOUNTER — Encounter: Payer: Self-pay | Admitting: Family Medicine

## 2023-02-01 VITALS — Temp 98.2°F | Ht 62.0 in | Wt 155.0 lb

## 2023-02-01 DIAGNOSIS — H6991 Unspecified Eustachian tube disorder, right ear: Secondary | ICD-10-CM | POA: Diagnosis not present

## 2023-02-01 DIAGNOSIS — J0140 Acute pansinusitis, unspecified: Secondary | ICD-10-CM

## 2023-02-01 DIAGNOSIS — J029 Acute pharyngitis, unspecified: Secondary | ICD-10-CM

## 2023-02-01 LAB — POCT RAPID STREP A (OFFICE): Rapid Strep A Screen: NEGATIVE

## 2023-02-01 LAB — POC COVID19 BINAXNOW: SARS Coronavirus 2 Ag: NEGATIVE

## 2023-02-01 MED ORDER — FLUTICASONE PROPIONATE 50 MCG/ACT NA SUSP: 2.0000 | Freq: Every day | NASAL | 1 refills | Status: AC

## 2023-02-01 MED ORDER — AMOXICILLIN-POT CLAVULANATE 875-125 MG PO TABS: 1.0000 | ORAL_TABLET | Freq: Two times a day (BID) | ORAL | 0 refills | Status: DC

## 2023-02-01 NOTE — Patient Instructions (Addendum)
Strep and COVID testing is negative.  Given the ongoing sinus pressure now with worsening ear pain and sore throat (likely from drainage), let's go ahead and treat like a sinus infection to see if this resolves.  Orthostatic and vertigo testing was negative today.  Start Flonase daily. If you want to restart your allergy pill you can, or you can start with just Flonase for now.  Adding Augmentin antibiotics Continue supportive measures including rest, hydration, humidifier use, steam showers, warm compresses to sinuses, warm liquids with lemon and honey, and over-the-counter cough, cold, and analgesics as needed.   Please contact office for follow-up if symptoms do not improve or worsen. Seek emergency care if symptoms become severe.

## 2023-02-01 NOTE — Progress Notes (Signed)
Acute Office Visit  Subjective:     Patient ID: Christine Griffith, female    DOB: 1966/03/30, 57 y.o.   MRN: [id number]  Chief Complaint  Patient presents with   Sore Throat   Ear Problem     Patient is in today for URI.   Discussed the use of AI scribe software for clinical note transcription with the patient, who gave verbal consent to proceed.  History of Present Illness   The patient, with an unspecified history of allergies, presents with a two to three-week history of lightheadedness and recent onset of right-sided ear pain. The lightheadedness is described as a feeling of wooziness, particularly noticeable when the patient is rushing or changing positions. It is not associated with syncope, but there is a mild associated dizziness with pressure to right frontal and maxillary sinuses. The patient denies any chest pain, palpitations, or shortness of breath. There have been a few instances of headaches, but these are not constant.  In addition to the occasional lightheadedness, the patient has been experiencing right-sided ear pain for approximately two to three days. This pain is associated with soreness in the right side of the throat and neck. The patient denies any cough but notes that their mucus has been thicker than usual. They have not been around anyone who has been sick recently.  The patient also mentions that they have been taking over-the-counter allergy medication regularly but recently ran out and have not been taking it as consistently the past 2-3 weeks.           ROS All review of systems negative except what is listed in the HPI      Objective:    Temp 98.2 F (36.8 C) (Oral)   Ht 5\' 2"  (1.575 m)   Wt 155 lb (70.3 kg)   SpO2 100%   BMI 28.35 kg/m    Orthostatic VS for the past 24 hrs:  BP- Lying Pulse- Lying BP- Sitting Pulse- Sitting BP- Standing at 0 minutes Pulse- Standing at 0 minutes  02/01/23 1029 140/85 81 136/87 81 (!) 132/95 89       Physical Exam Vitals reviewed.  Constitutional:      Appearance: Normal appearance.  HENT:     Head:     Comments: Negative [place]    Right Ear: A middle ear effusion is present. Tympanic membrane is not erythematous.     Left Ear: Tympanic membrane normal.     Nose: Congestion and rhinorrhea present.     Mouth/Throat:     Mouth: Mucous membranes are moist.     Pharynx: Posterior oropharyngeal erythema present.     Tonsils: No tonsillar exudate or tonsillar abscesses.     Comments: Cobblestoning/PND Cardiovascular:     Rate and Rhythm: Normal rate and regular rhythm.     Heart sounds: Normal heart sounds.  Pulmonary:     Effort: Pulmonary effort is normal.     Breath sounds: Normal breath sounds.  Musculoskeletal:     Cervical back: Normal range of motion and neck supple.  Lymphadenopathy:     Cervical: No cervical adenopathy.  Skin:    General: Skin is warm and dry.  Neurological:     Mental Status: She is alert and oriented to person, place, and time.  Psychiatric:        Mood and Affect: Mood normal.        Behavior: Behavior normal.        Thought Content: Thought  content normal.        Judgment: Judgment normal.     Results for orders placed or performed in visit on 02/01/23  POC COVID-19 BinaxNow  Result Value Ref Range   SARS Coronavirus 2 Ag Negative Negative  POCT rapid strep A  Result Value Ref Range   Rapid Strep A Screen Negative Negative        Assessment & Plan:   Problem List Items Addressed This Visit   None Visit Diagnoses     Sore throat    -  Primary   Relevant Orders   POC COVID-19 BinaxNow (Completed)   POCT rapid strep A (Completed)   Dysfunction of right eustachian tube       Relevant Medications   fluticasone (FLONASE) 50 MCG/ACT nasal spray   Acute non-recurrent pansinusitis       Relevant Medications   fluticasone (FLONASE) 50 MCG/ACT nasal spray   amoxicillin-clavulanate (AUGMENTIN) 875-125 MG tablet      Strep and COVID testing is negative.  Given the ongoing sinus pressure now with worsening ear pain and sore throat (likely from drainage), let's go ahead and treat like a sinus infection to see if this resolves.  Orthostatic and vertigo testing was negative today.  Start Flonase daily. If you want to restart your allergy pill you can, or you can start with just Flonase for now.  Adding Augmentin antibiotics Continue supportive measures including rest, hydration, humidifier use, steam showers, warm compresses to sinuses, warm liquids with lemon and honey, and over-the-counter cough, cold, and analgesics as needed.   Please contact office for follow-up if symptoms do not improve or worsen. Seek emergency care if symptoms become severe.   Meds ordered this encounter  Medications   fluticasone (FLONASE) 50 MCG/ACT nasal spray    Sig: Place 2 sprays into both nostrils daily.    Dispense:  16 g    Refill:  1    Order Specific Question:   Supervising Provider    Answer:   Danise Edge A [[id number]]   amoxicillin-clavulanate (AUGMENTIN) 875-125 MG tablet    Sig: Take 1 tablet by mouth 2 (two) times daily.    Dispense:  20 tablet    Refill:  0    Order Specific Question:   Supervising Provider    Answer:   Danise Edge A [[id number]]    Return if symptoms worsen or fail to improve.  Clayborne Dana, NP

## 2023-02-09 ENCOUNTER — Encounter: Payer: Self-pay | Admitting: Family Medicine

## 2023-02-09 ENCOUNTER — Other Ambulatory Visit: Payer: Commercial Managed Care - PPO

## 2023-02-09 ENCOUNTER — Ambulatory Visit: Payer: Commercial Managed Care - PPO | Admitting: Family Medicine

## 2023-02-09 ENCOUNTER — Other Ambulatory Visit (INDEPENDENT_AMBULATORY_CARE_PROVIDER_SITE_OTHER): Payer: Commercial Managed Care - PPO

## 2023-02-09 DIAGNOSIS — E785 Hyperlipidemia, unspecified: Secondary | ICD-10-CM | POA: Diagnosis not present

## 2023-02-09 LAB — COMPREHENSIVE METABOLIC PANEL
ALT: 13 U/L (ref 0–35)
AST: 12 U/L (ref 0–37)
Albumin: 3.8 g/dL (ref 3.5–5.2)
Alkaline Phosphatase: 93 U/L (ref 39–117)
BUN: 14 mg/dL (ref 6–23)
CO2: 29 meq/L (ref 19–32)
Calcium: 8.8 mg/dL (ref 8.4–10.5)
Chloride: 104 meq/L (ref 96–112)
Creatinine, Ser: 0.83 mg/dL (ref 0.40–1.20)
GFR: 78.94 mL/min (ref >60.00)
Glucose, Bld: 81 mg/dL (ref 70–99)
Potassium: 4.2 meq/L (ref 3.5–5.1)
Sodium: 140 meq/L (ref 135–145)
Total Bilirubin: 0.7 mg/dL (ref 0.2–1.2)
Total Protein: 6.6 g/dL (ref 6.0–8.3)

## 2023-02-09 LAB — LIPID PANEL
Cholesterol: 165 mg/dL (ref 0–200)
HDL: 57.5 mg/dL (ref >39.00)
LDL Cholesterol: 95 mg/dL (ref 0–99)
NonHDL: 107.85
Total CHOL/HDL Ratio: 3
Triglycerides: 66 mg/dL (ref 0.0–149.0)
VLDL: 13.2 mg/dL (ref 0.0–40.0)

## 2023-03-12 ENCOUNTER — Encounter: Payer: Self-pay | Admitting: Family Medicine

## 2023-03-13 ENCOUNTER — Encounter: Payer: Self-pay | Admitting: Family Medicine

## 2023-03-29 ENCOUNTER — Other Ambulatory Visit: Payer: Self-pay | Admitting: Family Medicine

## 2023-04-23 ENCOUNTER — Other Ambulatory Visit: Payer: Self-pay | Admitting: Family Medicine

## 2023-05-17 ENCOUNTER — Encounter: Payer: Self-pay | Admitting: Family Medicine

## 2023-05-17 NOTE — Telephone Encounter (Signed)
error 

## 2023-05-22 ENCOUNTER — Ambulatory Visit: Payer: Commercial Managed Care - PPO | Admitting: Family Medicine

## 2023-05-22 ENCOUNTER — Encounter: Payer: Self-pay | Admitting: Family Medicine

## 2023-05-22 VITALS — BP 130/80 | HR 86 | Temp 98.0°F | Resp 16 | Ht 62.0 in | Wt 160.8 lb

## 2023-05-22 DIAGNOSIS — E785 Hyperlipidemia, unspecified: Secondary | ICD-10-CM | POA: Diagnosis not present

## 2023-05-22 DIAGNOSIS — Z0000 Encounter for general adult medical examination without abnormal findings: Secondary | ICD-10-CM

## 2023-05-22 DIAGNOSIS — R7303 Prediabetes: Secondary | ICD-10-CM | POA: Diagnosis not present

## 2023-05-22 DIAGNOSIS — E559 Vitamin D deficiency, unspecified: Secondary | ICD-10-CM

## 2023-05-22 DIAGNOSIS — R252 Cramp and spasm: Secondary | ICD-10-CM | POA: Diagnosis not present

## 2023-05-22 MED ORDER — METHOCARBAMOL 750 MG PO TABS: 750.0000 mg | ORAL_TABLET | Freq: Three times a day (TID) | ORAL | Status: DC | PRN

## 2023-05-22 MED ORDER — ONE-DAILY MULTI VITAMINS PO TABS: ORAL_TABLET | ORAL | Status: AC

## 2023-05-22 NOTE — Patient Instructions (Addendum)
Keep the diet clean and stay active.  Heat (pad or rice pillow in microwave) over affected area, 10-15 minutes twice daily.   Ice/cold pack over area for 10-15 min twice daily.  OK to take a multivitamin and omega 3 supplementation.   Let us know if you need anything.

## 2023-05-22 NOTE — Progress Notes (Signed)
Chief Complaint  Patient presents with   Follow-up    Follow up    Subjective: Patient is a 57 y.o. female here for f/u.  Patient comes in for a myriad of issues.  She has a history of prediabetes.  She is no longer on metformin.  Diet is fair.  Exercise is limited due to chronic pain.  She has a history of nocturnal cramping.  She tries to stay well-hydrated.  She is no longer on a statin as it gave her myalgias.  She has a history of low vitamin D.  She has not been on a weekly supplement for nearly a year.  Past Medical History:  Diagnosis Date   Chicken pox    Environmental allergies    GERD (gastroesophageal reflux disease)    Glaucoma (increased eye pressure)    Borderline   Seasonal allergies    Vitamin D deficiency     Objective: BP 130/80   Pulse 86   Temp 98 F (36.7 C) (Oral)   Resp 16   Ht 5\' 2"  (1.575 m)   Wt 160 lb 12.8 oz (72.9 kg)   SpO2 96%   BMI 29.41 kg/m  General: Awake, appears stated age Heart: RRR, no LE edema Lungs: CTAB, no rales, wheezes or rhonchi. No accessory muscle use Psych: Age appropriate judgment and insight, normal affect and mood  Assessment and Plan: Hyperlipidemia, unspecified hyperlipidemia type  Vitamin D deficiency - Plan: CANCELED: VITAMIN D 25 Hydroxy (Vit-D Deficiency, Fractures)  Prediabetes - Plan: Hemoglobin A1c, CANCELED: Hemoglobin A1c  Leg cramping - Plan: Magnesium, IBC + Ferritin, CANCELED: CBC, CANCELED: Comprehensive metabolic panel, CANCELED: TSH, CANCELED: IBC + Ferritin, CANCELED: Magnesium  Well adult exam - Plan: TSH, VITAMIN D 25 Hydroxy (Vit-D Deficiency, Fractures), Lipid panel, Comprehensive metabolic panel, CBC  Check above labs.  Counseled on diet and exercise.  May need to add back metformin.  Would be surprised if she has diabetes now.  Follow-up as originally scheduled. The patient voiced understanding and agreement to the plan.  Jilda Roche [place], DO 05/22/23  12:45 PM

## 2023-05-28 ENCOUNTER — Other Ambulatory Visit: Payer: Self-pay | Admitting: Family Medicine

## 2023-05-28 ENCOUNTER — Other Ambulatory Visit (INDEPENDENT_AMBULATORY_CARE_PROVIDER_SITE_OTHER): Payer: Commercial Managed Care - PPO

## 2023-05-28 ENCOUNTER — Other Ambulatory Visit: Payer: Self-pay

## 2023-05-28 ENCOUNTER — Encounter: Payer: Self-pay | Admitting: Family Medicine

## 2023-05-28 DIAGNOSIS — R7303 Prediabetes: Secondary | ICD-10-CM | POA: Diagnosis not present

## 2023-05-28 DIAGNOSIS — R252 Cramp and spasm: Secondary | ICD-10-CM | POA: Diagnosis not present

## 2023-05-28 DIAGNOSIS — Z0000 Encounter for general adult medical examination without abnormal findings: Secondary | ICD-10-CM

## 2023-05-28 LAB — LIPID PANEL
Cholesterol: 185 mg/dL (ref 0–200)
HDL: 49.9 mg/dL (ref >39.00)
LDL Cholesterol: 124 mg/dL — ABNORMAL HIGH (ref 0–99)
NonHDL: 135.5
Total CHOL/HDL Ratio: 4
Triglycerides: 57 mg/dL (ref 0.0–149.0)
VLDL: 11.4 mg/dL (ref 0.0–40.0)

## 2023-05-28 LAB — COMPREHENSIVE METABOLIC PANEL
ALT: 13 U/L (ref 0–35)
AST: 12 U/L (ref 0–37)
Albumin: 4.1 g/dL (ref 3.5–5.2)
Alkaline Phosphatase: 93 U/L (ref 39–117)
BUN: 12 mg/dL (ref 6–23)
CO2: 29 meq/L (ref 19–32)
Calcium: 8.5 mg/dL (ref 8.4–10.5)
Chloride: 104 meq/L (ref 96–112)
Creatinine, Ser: 0.89 mg/dL (ref 0.40–1.20)
GFR: 72.44 mL/min (ref >60.00)
Glucose, Bld: 83 mg/dL (ref 70–99)
Potassium: 4.3 meq/L (ref 3.5–5.1)
Sodium: 140 meq/L (ref 135–145)
Total Bilirubin: 0.6 mg/dL (ref 0.2–1.2)
Total Protein: 6.7 g/dL (ref 6.0–8.3)

## 2023-05-28 LAB — MICROALBUMIN / CREATININE URINE RATIO
Creatinine,U: 96 mg/dL
Microalb Creat Ratio: 0.7 mg/g (ref 0.0–30.0)
Microalb, Ur: <0.7 mg/dL (ref 0.0–1.9)

## 2023-05-28 LAB — CBC
HCT: 38.9 % (ref 36.0–46.0)
Hemoglobin: 12.5 g/dL (ref 12.0–15.0)
MCHC: 32.1 g/dL (ref 30.0–36.0)
MCV: 90.7 fL (ref 78.0–100.0)
Platelets: 393 10*3/uL (ref 150.0–400.0)
RBC: 4.29 Mil/uL (ref 3.87–5.11)
RDW: 14 % (ref 11.5–15.5)
WBC: 6.7 10*3/uL (ref 4.0–10.5)

## 2023-05-28 LAB — TSH: TSH: 1.81 u[IU]/mL (ref 0.35–5.50)

## 2023-05-28 LAB — IBC + FERRITIN
Ferritin: 55.6 ng/mL (ref 10.0–291.0)
Iron: 69 ug/dL (ref 42–145)
Saturation Ratios: 23.8 % (ref 20.0–50.0)
TIBC: 289.8 ug/dL (ref 250.0–450.0)
Transferrin: 207 mg/dL — ABNORMAL LOW (ref 212.0–360.0)

## 2023-05-28 LAB — HEMOGLOBIN A1C: Hgb A1c MFr Bld: 6.1 % (ref 4.6–6.5)

## 2023-05-28 LAB — VITAMIN D 25 HYDROXY (VIT D DEFICIENCY, FRACTURES): VITD: 17.68 ng/mL — ABNORMAL LOW (ref 30.00–100.00)

## 2023-05-28 LAB — MAGNESIUM: Magnesium: 2.1 mg/dL (ref 1.5–2.5)

## 2023-05-28 MED ORDER — VITAMIN D (ERGOCALCIFEROL) 1.25 MG (50000 UNIT) PO CAPS: 50000.0000 [IU] | ORAL_CAPSULE | ORAL | 0 refills | Status: DC

## 2023-05-28 NOTE — Telephone Encounter (Signed)
error 

## 2023-05-28 NOTE — Addendum Note (Signed)
Addended by: Maximino Sarin on: 05/28/2023 07:44 AM   Modules accepted: Orders

## 2023-05-29 ENCOUNTER — Telehealth: Payer: Self-pay | Admitting: Family Medicine

## 2023-05-29 ENCOUNTER — Other Ambulatory Visit: Payer: Self-pay

## 2023-05-29 DIAGNOSIS — E559 Vitamin D deficiency, unspecified: Secondary | ICD-10-CM

## 2023-05-29 NOTE — Telephone Encounter (Signed)
Patient is returning a call from the office she would like a call back regarding the call she received

## 2023-05-31 ENCOUNTER — Encounter: Payer: Self-pay | Admitting: Family Medicine

## 2023-05-31 NOTE — Telephone Encounter (Signed)
Pt called back again for labs.

## 2023-06-01 ENCOUNTER — Other Ambulatory Visit: Payer: Self-pay

## 2023-06-01 DIAGNOSIS — E559 Vitamin D deficiency, unspecified: Secondary | ICD-10-CM

## 2023-06-01 NOTE — Telephone Encounter (Signed)
Sent pt mychart to call to schedule lab appt.

## 2023-06-04 ENCOUNTER — Encounter: Payer: Self-pay | Admitting: Family Medicine

## 2023-06-04 ENCOUNTER — Ambulatory Visit (INDEPENDENT_AMBULATORY_CARE_PROVIDER_SITE_OTHER): Payer: Commercial Managed Care - PPO | Admitting: Family Medicine

## 2023-06-04 VITALS — BP 128/78 | HR 74 | Temp 98.0°F | Resp 16 | Ht 62.0 in | Wt 160.0 lb

## 2023-06-04 DIAGNOSIS — Z0000 Encounter for general adult medical examination without abnormal findings: Secondary | ICD-10-CM | POA: Diagnosis not present

## 2023-06-04 NOTE — Progress Notes (Signed)
Chief Complaint  Patient presents with   Annual Exam    Annual Exam     Well Woman Christine Griffith is here for a complete physical.   Her last physical was >1 year ago.  Current diet: in general, diet is OK. Current exercise: none lately. Weight is stable and she denies fatigue out of ordinary. Seatbelt? Yes Advanced directive? Unsure  Health Maintenance Pap/HPV- Yes- still gets Mammogram- Yes Colon cancer screening-Yes Shingrix- Yes Tetanus- Yes Hep C screening- Yes HIV screening- Yes  Past Medical History:  Diagnosis Date   Chicken pox    Environmental allergies    GERD (gastroesophageal reflux disease)    Glaucoma (increased eye pressure)    Borderline   Seasonal allergies    Vitamin D deficiency      Past Surgical History:  Procedure Laterality Date   ABDOMINAL HYSTERECTOMY  2014   BREAST BIOPSY Right    stereo   CESAREAN SECTION     WISDOM TOOTH EXTRACTION      Medications  Current Outpatient Medications on File Prior to Visit  Medication Sig Dispense Refill   calcium carbonate (TUMS - DOSED IN MG ELEMENTAL CALCIUM) 500 MG chewable tablet Chew 1 tablet by mouth daily.     clobetasol cream (TEMOVATE) 0.05 % Apply 1 application topically 2 (two) times daily. 30 g 0   ezetimibe (ZETIA) 10 MG tablet Take 1 tablet (10 mg total) by mouth daily. 30 tablet 0   fish oil-omega-3 fatty acids 1000 MG capsule Take 1 g by mouth every other day.     fluticasone (FLONASE) 50 MCG/ACT nasal spray Place 2 sprays into both nostrils daily. 16 g 1   gabapentin (NEURONTIN) 100 MG capsule Take 1 capsule (100 mg total) by mouth 3 (three) times daily. 90 capsule 2   loratadine (CLARITIN) 10 MG tablet TAKE 1 TABLET BY MOUTH ONCE DAILY AS NEEDED 45 tablet 0   methocarbamol (ROBAXIN) 750 MG tablet Take 1 tablet (750 mg total) by mouth every 8 (eight) hours as needed for muscle spasms.     Multiple Vitamin (MULTIVITAMIN) tablet Daily prn     Vitamin D, Ergocalciferol, (DRISDOL) 1.25 MG  (50000 UNIT) CAPS capsule Take 1 capsule (50,000 Units total) by mouth every 7 (seven) days. 12 capsule 0    Allergies Allergies  Allergen Reactions   Codeine Nausea And Vomiting   Prednisone Nausea And Vomiting   Hydrocodone Nausea And Vomiting   Statins Rash    Myalgias    Review of Systems: Constitutional:  no unexpected weight changes Eye:  no recent significant change in vision Ear/Nose/Mouth/Throat:  Ears:  no recent change in hearing Nose/Mouth/Throat:  no complaints of nasal congestion, no sore throat Cardiovascular: no chest pain Respiratory:  no shortness of breath Gastrointestinal:  no abdominal pain, no change in bowel habits GU:  Female: negative for dysuria or pelvic pain Musculoskeletal/Extremities:  no new pain of the joints Integumentary (Skin/Breast):  no abnormal skin lesions reported Neurologic:  no headaches Endocrine:  denies fatigue  Exam BP 128/78   Pulse 74   Temp 98 F (36.7 C) (Oral)   Resp 16   Ht 5\' 2"  (1.575 m)   Wt 160 lb (72.6 kg)   SpO2 98%   BMI 29.26 kg/m  General:  well developed, well nourished, in no apparent distress Skin:  no significant moles, warts, or growths Head:  no masses, lesions, or tenderness Eyes:  pupils equal and round, sclera anicteric without injection Ears:  canals without lesions, TMs shiny without retraction, no obvious effusion, no erythema Nose:  nares patent, mucosa normal, and no drainage  Throat/Pharynx:  lips and gingiva without lesion; tongue and uvula midline; non-inflamed pharynx; no exudates or postnasal drainage Neck: neck supple without adenopathy, thyromegaly, or masses Lungs:  clear to auscultation, breath sounds equal bilaterally, no respiratory distress Cardio:  regular rate and rhythm, no LE edema Abdomen:  abdomen soft, nontender; bowel sounds normal; no masses or organomegaly Genital: Defer to GYN Musculoskeletal:  symmetrical muscle groups noted without atrophy or deformity Extremities:   no clubbing, cyanosis, or edema, no deformities, no skin discoloration Neuro:  gait normal; deep tendon reflexes normal and symmetric Psych: well oriented with normal range of affect and appropriate judgment/insight  Assessment and Plan  Well adult exam   Well 57 y.o. female. Counseled on diet and exercise. Advanced directive form provided today.  Follow up in 6 mo or prn.  The patient voiced understanding and agreement to the plan.  Jilda Roche [place], DO 06/04/23 12:50 PM

## 2023-06-04 NOTE — Patient Instructions (Signed)
Keep the diet clean and stay active.  Please get me a copy of your advanced directive form at your convenience.   Let us know if you need anything.  

## 2023-06-20 ENCOUNTER — Other Ambulatory Visit: Payer: Self-pay | Admitting: Family Medicine

## 2023-07-05 ENCOUNTER — Telehealth: Payer: Self-pay

## 2023-07-05 NOTE — Telephone Encounter (Signed)
Copied from CRM [id number]. Topic: General - Call Back - No Documentation >> Jul 05, 2023  8:22 AM Irine Seal wrote: Reason for CRM: Pt stated they received a voicemail, checked notes could not determine reason for the missed call. Patient call back: [phone]

## 2023-07-06 NOTE — Telephone Encounter (Signed)
Called pt lvm letting her know the call was discuss results if she haven't seen them, needed to schedule lab appt For 12 weeks.

## 2023-08-17 ENCOUNTER — Other Ambulatory Visit: Payer: Self-pay | Admitting: Family Medicine

## 2023-08-27 ENCOUNTER — Encounter: Payer: Self-pay | Admitting: Family Medicine

## 2023-08-27 ENCOUNTER — Other Ambulatory Visit: Payer: Self-pay | Admitting: Family Medicine

## 2023-08-27 ENCOUNTER — Other Ambulatory Visit (INDEPENDENT_AMBULATORY_CARE_PROVIDER_SITE_OTHER): Payer: Commercial Managed Care - PPO

## 2023-08-27 DIAGNOSIS — E559 Vitamin D deficiency, unspecified: Secondary | ICD-10-CM | POA: Diagnosis not present

## 2023-08-27 LAB — VITAMIN D 25 HYDROXY (VIT D DEFICIENCY, FRACTURES): VITD: 38.59 ng/mL (ref 30.00–100.00)

## 2023-08-27 MED ORDER — VITAMIN D (ERGOCALCIFEROL) 1.25 MG (50000 UNIT) PO CAPS: 50000.0000 [IU] | ORAL_CAPSULE | ORAL | 0 refills | Status: DC

## 2023-08-28 ENCOUNTER — Telehealth: Payer: Self-pay | Admitting: Emergency Medicine

## 2023-08-28 DIAGNOSIS — E785 Hyperlipidemia, unspecified: Secondary | ICD-10-CM

## 2023-08-28 DIAGNOSIS — R7303 Prediabetes: Secondary | ICD-10-CM

## 2023-08-28 NOTE — Addendum Note (Signed)
 Addended by: Cheron Every C on: 08/28/2023 05:01 PM   Modules accepted: Orders

## 2023-08-28 NOTE — Telephone Encounter (Signed)
 Copied from CRM [id number]. Topic: General - Other >> Aug 28, 2023  1:58 PM Truddie Crumble wrote: Reason for CRM: patient came in Monday and got labs done for vitamin D and thought she was going to get he kidney function labs done but it was not. Patient want to get kidney labs done to see how they function with the medicine

## 2023-10-12 ENCOUNTER — Encounter (HOSPITAL_COMMUNITY): Payer: Self-pay

## 2023-10-19 ENCOUNTER — Ambulatory Visit: Admitting: Family Medicine

## 2023-10-19 ENCOUNTER — Encounter: Payer: Self-pay | Admitting: Family Medicine

## 2023-10-19 ENCOUNTER — Telehealth: Payer: Self-pay

## 2023-10-19 VITALS — BP 124/78 | HR 94 | Temp 98.0°F | Resp 18 | Ht 62.0 in | Wt 161.0 lb

## 2023-10-19 DIAGNOSIS — E559 Vitamin D deficiency, unspecified: Secondary | ICD-10-CM

## 2023-10-19 DIAGNOSIS — R7303 Prediabetes: Secondary | ICD-10-CM | POA: Diagnosis not present

## 2023-10-19 DIAGNOSIS — R5383 Other fatigue: Secondary | ICD-10-CM

## 2023-10-19 DIAGNOSIS — G8929 Other chronic pain: Secondary | ICD-10-CM

## 2023-10-19 DIAGNOSIS — G4700 Insomnia, unspecified: Secondary | ICD-10-CM | POA: Diagnosis not present

## 2023-10-19 DIAGNOSIS — E785 Hyperlipidemia, unspecified: Secondary | ICD-10-CM

## 2023-10-19 LAB — COMPREHENSIVE METABOLIC PANEL WITH GFR
ALT: 14 U/L (ref 0–35)
AST: 14 U/L (ref 0–37)
Albumin: 4.1 g/dL (ref 3.5–5.2)
Alkaline Phosphatase: 80 U/L (ref 39–117)
BUN: 10 mg/dL (ref 6–23)
CO2: 29 meq/L (ref 19–32)
Calcium: 8.8 mg/dL (ref 8.4–10.5)
Chloride: 105 meq/L (ref 96–112)
Creatinine, Ser: 0.78 mg/dL (ref 0.40–1.20)
GFR: 84.64 mL/min (ref >60.00)
Glucose, Bld: 91 mg/dL (ref 70–99)
Potassium: 3.8 meq/L (ref 3.5–5.1)
Sodium: 140 meq/L (ref 135–145)
Total Bilirubin: 0.6 mg/dL (ref 0.2–1.2)
Total Protein: 6.8 g/dL (ref 6.0–8.3)

## 2023-10-19 LAB — CBC
HCT: 36.8 % (ref 36.0–46.0)
Hemoglobin: 12 g/dL (ref 12.0–15.0)
MCHC: 32.7 g/dL (ref 30.0–36.0)
MCV: 87.8 fl (ref 78.0–100.0)
Platelets: 393 10*3/uL (ref 150.0–400.0)
RBC: 4.19 Mil/uL (ref 3.87–5.11)
RDW: 13.9 % (ref 11.5–15.5)
WBC: 7 10*3/uL (ref 4.0–10.5)

## 2023-10-19 LAB — LIPID PANEL
Cholesterol: 190 mg/dL (ref 0–200)
HDL: 47.7 mg/dL (ref >39.00)
LDL Cholesterol: 129 mg/dL — ABNORMAL HIGH (ref 0–99)
NonHDL: 142.49
Total CHOL/HDL Ratio: 4
Triglycerides: 68 mg/dL (ref 0.0–149.0)
VLDL: 13.6 mg/dL (ref 0.0–40.0)

## 2023-10-19 LAB — HEMOGLOBIN A1C: Hgb A1c MFr Bld: 6 % (ref 4.6–6.5)

## 2023-10-19 MED ORDER — AMITRIPTYLINE HCL 10 MG PO TABS: 10.0000 mg | ORAL_TABLET | Freq: Every day | ORAL | 1 refills | Status: DC

## 2023-10-19 NOTE — Telephone Encounter (Signed)
 I can send to her MyChart or print/sign for her to pick up. Letter written. Thx.

## 2023-10-19 NOTE — Progress Notes (Signed)
 Chief Complaint  Patient presents with   Back Pain    Radiates to left leg     Subjective: Patient is a 58 y.o. female here for f/u.  Patient has a history of chronic low back pain.  She has had an extensive workup and trial of treatment options to the pain clinic at [facility].  She has been through physical therapy but has not been compliant with her home exercise program.  She has failed Cymbalta  due to side effects.  She does not want or tolerate any opiates.  She is wondering what the next steps are.  The patient has been having worsening fatigue.  She will go to sleep and wake up feeling like she has not slept at all.  She does not think she snores at night and does not wake up gasping for air.  No family history of sleep apnea.  She has never had a sleep study.  Mood is okay, she does have a lot on her plate and feels stressed sometimes.  She gets around 4 to 5 hours of sleep on work nights and closer to 6-7 on days she does not work.  She feels the same on either type of day.  Sometimes she has trouble falling or staying asleep.  She does have a history of low vitamin D .  Past Medical History:  Diagnosis Date   Chicken pox    Environmental allergies    GERD (gastroesophageal reflux disease)    Glaucoma (increased eye pressure)    Borderline   Seasonal allergies    Vitamin D  deficiency     Objective: BP 124/78 (BP Location: Left Arm, Cuff Size: Large)   Pulse 94   Temp 98 F (36.7 C)   Resp 18   Ht 5\' 2"  (1.575 m)   Wt 161 lb (73 kg)   SpO2 98%   BMI 29.45 kg/m  General: Awake, appears stated age Heart: RRR, no LE edema Lungs: CTAB, no rales, wheezes or rhonchi. No accessory muscle use MSK: TTP and hypertonicity, worse on the right, in the lumbar spine.  No midline tenderness.  No deformity.  No SI joint TTP.  Negative straight leg bilaterally.  Tight hamstrings bilaterally, worse on the right. Neuro: DTRs equal and symmetric in the lower extremities without clonus.   Gait is normal.  5/5 strength in the lower extremities throughout. Psych: Age appropriate judgment and insight, normal affect and mood  Assessment and Plan: Vitamin D  deficiency - Plan: VITAMIN D  25 Hydroxy (Vit-D Deficiency, Fractures)  Fatigue, unspecified type - Plan: CBC, Comprehensive metabolic panel with GFR, IBC + Ferritin, TSH, T4, free  Insomnia, unspecified type  Prediabetes - Plan: Hemoglobin A1c  Hyperlipidemia, unspecified hyperlipidemia type - Plan: Lipid panel  Other chronic pain - Plan: amitriptyline (ELAVIL) 10 MG tablet  Check vitamin D . Probably multifactorial.  Check labs.  We discussed a sleep study which she would like to hold off on for now.  I think getting better sleep would help at the very least. Amitriptyline as below. Follow-up on A1c. Follow-up on lipid panel Chronic, not controlled.  Start amitriptyline 10 mg daily.  Failed Cymbalta .  Appreciate pain clinic input.  Follow-up in 1 month to recheck this. The patient voiced understanding and agreement to the plan.  I spent 43 minutes with the patient discussing the above plans in addition to reviewing her chart on the same day of the visit.  Shellie Dials [place], DO 10/19/23  9:51 AM

## 2023-10-19 NOTE — Patient Instructions (Addendum)
 Give us  2-3 business days to get the results of your labs back.   Keep the diet clean and stay active.  Make sure you do your stretches. Use some heat prior to doing your stretches.  Let us  know if you need anything.

## 2023-10-19 NOTE — Telephone Encounter (Signed)
 Copied from CRM [id number]. Topic: General - Other >> Oct 19, 2023 10:16 AM Allyne Areola wrote: Reason for CRM: Patient was seen in the office today and discussed a letter with Dawna Etienne.  Would she be able to come back and pick it up? >> Oct 19, 2023 10:22 AM Kita Perish H wrote: Patient following up on previous message sent just wanted to add she does want to speak to the nurse.  Jalie [phone]

## 2023-10-22 ENCOUNTER — Encounter: Payer: Self-pay | Admitting: Family Medicine

## 2023-10-23 ENCOUNTER — Ambulatory Visit: Payer: Self-pay | Admitting: Family Medicine

## 2023-10-23 LAB — T4, FREE: Free T4: 0.72 ng/dL (ref 0.60–1.60)

## 2023-10-23 LAB — IBC + FERRITIN
Ferritin: 62.5 ng/mL (ref 10.0–291.0)
Iron: 73 ug/dL (ref 42–145)
Saturation Ratios: 25.2 % (ref 20.0–50.0)
TIBC: 289.8 ug/dL (ref 250.0–450.0)
Transferrin: 207 mg/dL — ABNORMAL LOW (ref 212.0–360.0)

## 2023-10-23 LAB — TSH: TSH: 2.49 u[IU]/mL (ref 0.35–5.50)

## 2023-10-23 LAB — VITAMIN D 25 HYDROXY (VIT D DEFICIENCY, FRACTURES): VITD: 36.94 ng/mL (ref 30.00–100.00)

## 2023-11-24 ENCOUNTER — Other Ambulatory Visit: Payer: Self-pay | Admitting: Family Medicine

## 2023-11-30 ENCOUNTER — Ambulatory Visit: Admitting: Family

## 2023-12-03 ENCOUNTER — Ambulatory Visit: Payer: Commercial Managed Care - PPO | Admitting: Family Medicine

## 2023-12-04 ENCOUNTER — Ambulatory Visit: Admitting: Family Medicine

## 2023-12-12 ENCOUNTER — Encounter: Payer: Self-pay | Admitting: Family Medicine

## 2023-12-12 ENCOUNTER — Ambulatory Visit: Admitting: Family Medicine

## 2023-12-12 VITALS — BP 132/82 | HR 78 | Temp 98.0°F | Resp 16 | Ht 62.0 in | Wt 158.0 lb

## 2023-12-12 DIAGNOSIS — G8929 Other chronic pain: Secondary | ICD-10-CM | POA: Diagnosis not present

## 2023-12-12 DIAGNOSIS — M5416 Radiculopathy, lumbar region: Secondary | ICD-10-CM

## 2023-12-12 DIAGNOSIS — M25512 Pain in left shoulder: Secondary | ICD-10-CM

## 2023-12-12 MED ORDER — OMEPRAZOLE 20 MG PO CPDR: 20.0000 mg | DELAYED_RELEASE_CAPSULE | Freq: Every day | ORAL | 1 refills | Status: AC

## 2023-12-12 NOTE — Patient Instructions (Signed)
 Keep the diet clean and stay active.  If you do not hear anything about your referral in the next 1-2 weeks, call our office and ask for an update.  Heat (pad or rice pillow in microwave) over affected area, 10-15 minutes twice daily.   Ice/cold pack over area for 10-15 min twice daily.  OK to take Tylenol 1000 mg (2 extra strength tabs) or 975 mg (3 regular strength tabs) every 6 hours as needed.  Let us  know if you need anything.

## 2023-12-12 NOTE — Progress Notes (Signed)
 Chief Complaint  Patient presents with   Follow-up    Subjective: Patient is a 58 y.o. female here for f/u.  Patient has been struggling with right-sided low back pain radiating through her right abdominal wall and right lower extremity.  She has been through physical therapy in the past but has not been compliant with her home exercise program.  No recent injury or change in activity.  She does not stretch routinely.  No bowel/bladder incontinence.  She was prescribed amitriptyline  but did not take it due to concerns for side effects which her mother experienced on the same medication.  Patient also has a chronic history of left-sided shoulder pain.  She has burning type pain radiating into her left upper extremity at times.  No decreased range of motion, bruising, redness, or swelling.  Past Medical History:  Diagnosis Date   Chicken pox    Environmental allergies    GERD (gastroesophageal reflux disease)    Glaucoma (increased eye pressure)    Borderline   Seasonal allergies    Vitamin D  deficiency     Objective: BP 132/82 (BP Location: Left Arm, Patient Position: Sitting)   Pulse 78   Temp 98 F (36.7 C) (Oral)   Resp 16   Ht 5' 2 (1.575 m)   Wt 158 lb (71.7 kg)   SpO2 99%   BMI 28.90 kg/m  General: Awake, appears stated age Heart: RRR, no LE edema Lungs: CTAB, no rales, wheezes or rhonchi. No accessory muscle use MSK: Poor flexibility of under gluteal musculature and hamstrings bilaterally TTP over the lower thoracic and upper lumbar paraspinal musculature on the right and over the lateral musculature; there is also TTP over the left trapezius over the spine of the scapula in addition to the posterior deltoid on the left.  Normal active and passive range of motion of the shoulder. Neuro: DTRs equal and symmetric in lower extremities, no clonus, no cerebellar signs, negative straight leg Psych: Age appropriate judgment and insight, normal affect and mood  Assessment and  Plan: Lumbar radiculopathy - Plan: Ambulatory referral to Physical Therapy  Chronic left shoulder pain  Chronic, not controlled.  Refer back to physical therapy.  Encouraged her to start the Amitriptyline  10 mg daily.  Could consider following up in 1 month if she is doing well or wants to increase.  Heat, ice, Tylenol. Physical therapy, stretches and exercises. Follow-up in 6 months for physical or as needed. The patient voiced understanding and agreement to the plan.  Mabel [place], DO 12/12/23  11:39 AM

## 2023-12-14 ENCOUNTER — Ambulatory Visit: Payer: Commercial Managed Care - PPO | Admitting: Family Medicine

## 2023-12-31 ENCOUNTER — Ambulatory Visit: Admitting: Family Medicine

## 2023-12-31 ENCOUNTER — Encounter: Payer: Self-pay | Admitting: Family Medicine

## 2023-12-31 VITALS — BP 126/80 | HR 96 | Temp 98.0°F | Resp 16 | Ht 62.0 in | Wt 160.0 lb

## 2023-12-31 DIAGNOSIS — R21 Rash and other nonspecific skin eruption: Secondary | ICD-10-CM

## 2023-12-31 NOTE — Progress Notes (Signed)
 Chief Complaint  Patient presents with   Rash    Stomach Rash    Christine Griffith is a 58 y.o. female here for a skin complaint.  Duration: 4 days Location: abd Pruritic? Yes Painful? Only if something is touching it Drainage? No New soaps/lotions/topicals/detergents? No Sick contacts? No Moisture/sweat seems to exacerbate this lesion. Other associated symptoms: flaking, blistering; no fevers Therapies tried thus far: anti-itch cream, alcohol   Past Medical History:  Diagnosis Date   Chicken pox    Environmental allergies    GERD (gastroesophageal reflux disease)    Glaucoma (increased eye pressure)    Borderline   Seasonal allergies    Vitamin D  deficiency     BP 126/80 (BP Location: Left Arm, Patient Position: Sitting)   Pulse 96   Temp 98 F (36.7 C) (Oral)   Resp 16   Ht 5' 2 (1.575 m)   Wt 160 lb (72.6 kg)   SpO2 98%   BMI 29.26 kg/m  Gen: awake, alert, appearing stated age Lungs: No accessory muscle use Skin: Over epigastric portion of the abdomen, there is a scaly area without TTP, fluctuance, drainage, excessive warmth, or vesicle formation. Psych: Age appropriate judgment and insight  Rash  Keep the area clean and dry overall.  If it happens again, she will take a picture and send it to us .  Okay to take Benadryl as needed with the Flonase  and Claritin . She has a history of chronic back pain and we are trying to get around with physical therapy.  Unfortunately we referred her to an office which does not par with her insurance.  She received a list of offices from her insurance company and will send us  a message with who she wants to be referred to. F/u prn. The patient voiced understanding and agreement to the plan.  Mabel [place], DO 12/31/23 2:54 PM

## 2023-12-31 NOTE — Patient Instructions (Signed)
 Keep the area clean and dry.  Send me a message with your preferred physical therapy office.   OK to take Benadryl with your Flonase  and Claritin .   Take a picture if this comes again.   Let us  know if you need anything.

## 2024-01-22 ENCOUNTER — Telehealth: Payer: Self-pay

## 2024-01-22 ENCOUNTER — Telehealth: Payer: Self-pay | Admitting: Family Medicine

## 2024-01-22 NOTE — Telephone Encounter (Signed)
 Paperwork has been placed in providers bin for review.

## 2024-01-22 NOTE — Telephone Encounter (Signed)
 Pt dropped of papers to be filled out by pcp. Placed papers in pcps box. Please call pt when papers have been faxed and are ready to be picked up.

## 2024-01-23 NOTE — Telephone Encounter (Signed)
 done

## 2024-01-23 NOTE — Telephone Encounter (Signed)
 Copied from CRM [id number]. Topic: General - Call Back - No Documentation >> Jan 23, 2024  1:28 PM Mesmerise C wrote: Reason for CRM: patient returning call checking who it was for if it was for her or her mother, checking if her FMLA  papers were done, called [employer] and spoke they advised it's still under review if needs to be reached can call back at  [phone]

## 2024-01-23 NOTE — Telephone Encounter (Signed)
 Called pt was advised we faxed the FMLA form and copy of paperwork is front. Pt stated understand.

## 2024-01-24 ENCOUNTER — Telehealth: Payer: Self-pay

## 2024-01-24 NOTE — Telephone Encounter (Signed)
 Copied from CRM #[id number]. Topic: General - Other >> Jan 24, 2024  1:48 PM Berneda FALCON wrote: Reason for CRM: Patient would like Dr. Konrad nurse to call her back about the FMLA forms that were filled out for her. Would like to clarify on page 3 #9-need to put how many days specifically-need to list the days. Page #4, number 10-check yes or no on one of those. And then resubmit it back to them. She is going back from her lunch break.   Please call back at [phone]

## 2024-01-25 NOTE — Telephone Encounter (Signed)
 Called pt lvm for her to call the office back let me know @ her request on page 3 # 9, how days needs to be listed.

## 2024-01-31 NOTE — Telephone Encounter (Signed)
 Spoke with patient and went thru paperwork that was faxed on 8/20 and she stated that sounded correct, so paperwork was refaxed.

## 2024-01-31 NOTE — Telephone Encounter (Unsigned)
 Copied from CRM [id number]. Topic: General - Other >> Jan 30, 2024  4:58 PM Deleta RAMAN wrote: Reason for CRM: patient would like to know if fmla paper work was faxed over on Monday. Call back number [phone] or respond through my chart

## 2024-01-31 NOTE — Telephone Encounter (Signed)
 Spoke with patient and she wanted to know if updated paperwork has been faxed back.  I only see paperwork from 8/20 at [facility] desk.  She stated that he had in his hand on Monday.  Advised her I will try and look for paper work.

## 2024-02-10 ENCOUNTER — Other Ambulatory Visit: Payer: Self-pay | Admitting: Family Medicine

## 2024-02-22 ENCOUNTER — Other Ambulatory Visit: Payer: Self-pay | Admitting: Family Medicine

## 2024-03-12 ENCOUNTER — Ambulatory Visit: Payer: Self-pay

## 2024-03-12 NOTE — Progress Notes (Unsigned)
 No chief complaint on file.    Subjective Sabriah B Danziger is a 58 y.o. female who presents with vomiting and diarrhea Symptoms began ***.  Patient has {Symptoms; gastroenteritis:623} Patient denies {Symptoms; gastroenteritis:623} Treatment to date: {YES NO:22349} Sick contacts: {Places; sick contacts:12987}  Past Medical History:  Diagnosis Date   Chicken pox    Environmental allergies    GERD (gastroesophageal reflux disease)    Glaucoma (increased eye pressure)    Borderline   Seasonal allergies    Vitamin D  deficiency     Exam There were no vitals taken for this visit. General:  well developed, well hydrated, in no apparent distress Skin:  warm, no pallor or diaphoresis, no rashes Throat/Pharynx:  lips and gingiva without lesion; tongue and uvula midline; non-inflamed pharynx; no exudates or postnasal drainage Lungs:  clear to auscultation, breath sounds equal bilaterally, no respiratory distress, no wheezes Cardio:  RRR Abdomen:  abdomen soft, nontender; bowel sounds normal; no masses or organomegaly Psych: Appropriate judgement/insight  Assessment and Plan  No diagnosis found.  Orders as above. Avoid aggravating foods, discussed advancing diet. F/u if symptoms fail to improve, sooner if worsening. The patient voiced understanding and agreement to the plan.  Harlene LITTIE Jolly, DNP, AGNP-C 03/12/24  4:20 PM

## 2024-03-12 NOTE — Telephone Encounter (Signed)
 Patient is going to take covid test tonight after work. If positive, she will call in the AM to ask if she can change appt to virtual.   FYI Only or Action Required?: FYI only for provider.  Patient was last seen in primary care on 12/31/2023 by Frann Mabel Mt, DO.  Called Nurse Triage reporting Nausea and Fatigue.  Symptoms began several days ago.  Interventions attempted: Rest, hydration, or home remedies.  Symptoms are: unchanged.  Triage Disposition: See Today or Tomorrow in Office (overriding See HCP Within 4 Hours (Or PCP Triage))  Patient/caregiver understands and will follow disposition?: Yes  Reason for Disposition  [1] MODERATE weakness (e.g., interferes with work, school, normal activities) AND [2] cause unknown  (Exceptions: Weakness from acute minor illness or poor fluid intake; weakness is chronic and not worse.)  Answer Assessment - Initial Assessment Questions Patient reports fatigue, nausea, chills and dry mouth x 2 days.  ONSET: When did these symptoms begin? (e.g., hours, days, weeks, months)     2 days ago  CAUSE: What do you think is causing the weakness or fatigue? (e.g., not drinking enough fluids, medical problem, trouble sleeping)     Unknown, states she was told she is prediabetic  NEW MEDICINES:  Have you started on any new medicines recently? (e.g., opioid pain medicines, benzodiazepines, muscle relaxants, antidepressants, antihistamines, neuroleptics, beta blockers)     Denies  OTHER SYMPTOMS: Do you have any other symptoms? (e.g., chest pain, fever, cough, SOB, vomiting, diarrhea, bleeding, other areas of pain)     Vomited yesterday, resolved  Protocols used: Weakness (Generalized) and Fatigue-A-AH Copied from CRM [id number]. Topic: Clinical - Red Word Triage >> Mar 12, 2024  2:00 PM Zy'onna H wrote: [facility] that prompted transfer to Nurse Triage: Patient is experiencing the following:  Predisposed to Pre-diabetes  Nausea Hot/Cold  spells Chills Chronic Fatigue  Dry Mouth   **Transf. to NT**

## 2024-03-13 ENCOUNTER — Ambulatory Visit: Admitting: Student

## 2024-03-13 ENCOUNTER — Ambulatory Visit: Payer: Self-pay | Admitting: Student

## 2024-03-13 ENCOUNTER — Encounter: Payer: Self-pay | Admitting: Student

## 2024-03-13 VITALS — BP 130/86 | HR 82 | Temp 97.9°F | Resp 16 | Ht 62.0 in | Wt 158.2 lb

## 2024-03-13 DIAGNOSIS — R5383 Other fatigue: Secondary | ICD-10-CM | POA: Diagnosis not present

## 2024-03-13 DIAGNOSIS — R202 Paresthesia of skin: Secondary | ICD-10-CM | POA: Diagnosis not present

## 2024-03-13 DIAGNOSIS — Z23 Encounter for immunization: Secondary | ICD-10-CM | POA: Diagnosis not present

## 2024-03-13 DIAGNOSIS — R110 Nausea: Secondary | ICD-10-CM

## 2024-03-13 DIAGNOSIS — R7303 Prediabetes: Secondary | ICD-10-CM

## 2024-03-13 LAB — TSH: TSH: 2.17 u[IU]/mL (ref 0.35–5.50)

## 2024-03-13 LAB — CBC
HCT: 39.4 % (ref 36.0–46.0)
Hemoglobin: 12.5 g/dL (ref 12.0–15.0)
MCHC: 31.8 g/dL (ref 30.0–36.0)
MCV: 88.1 fl (ref 78.0–100.0)
Platelets: 395 K/uL (ref 150.0–400.0)
RBC: 4.47 Mil/uL (ref 3.87–5.11)
RDW: 14.1 % (ref 11.5–15.5)
WBC: 6.7 K/uL (ref 4.0–10.5)

## 2024-03-13 LAB — COMPREHENSIVE METABOLIC PANEL WITH GFR
ALT: 12 U/L (ref 0–35)
AST: 13 U/L (ref 0–37)
Albumin: 4.3 g/dL (ref 3.5–5.2)
Alkaline Phosphatase: 99 U/L (ref 39–117)
BUN: 10 mg/dL (ref 6–23)
CO2: 31 meq/L (ref 19–32)
Calcium: 9 mg/dL (ref 8.4–10.5)
Chloride: 102 meq/L (ref 96–112)
Creatinine, Ser: 0.82 mg/dL (ref 0.40–1.20)
GFR: 79.48 mL/min (ref >60.00)
Glucose, Bld: 87 mg/dL (ref 70–99)
Potassium: 4.2 meq/L (ref 3.5–5.1)
Sodium: 139 meq/L (ref 135–145)
Total Bilirubin: 0.7 mg/dL (ref 0.2–1.2)
Total Protein: 7.2 g/dL (ref 6.0–8.3)

## 2024-03-13 LAB — HEMOGLOBIN A1C: Hgb A1c MFr Bld: 6.1 % (ref 4.6–6.5)

## 2024-03-13 LAB — VITAMIN B12: Vitamin B-12: 446 pg/mL (ref 211–911)

## 2024-03-13 MED ORDER — ONDANSETRON 4 MG PO TBDP: 4.0000 mg | ORAL_TABLET | Freq: Three times a day (TID) | ORAL | 0 refills | Status: AC | PRN

## 2024-03-14 ENCOUNTER — Encounter: Payer: Self-pay | Admitting: Student

## 2024-03-25 NOTE — Telephone Encounter (Signed)
 Patient called back stating she never received confirmation that forms were sent. I did let her know that forms were faxed twice according to the notes. She would like a copy of the forms to be left at the front desk for her to pick up. Please let her know when they are available. Did confirm these are the forms from August.

## 2024-03-26 NOTE — Telephone Encounter (Signed)
 Called spoke with pt advised her we can print off a copy but paperwork was faxed.

## 2024-03-28 ENCOUNTER — Telehealth: Payer: Self-pay | Admitting: Family Medicine

## 2024-03-28 NOTE — Telephone Encounter (Signed)
 CBC, CMP, lipid panel, vitamin D , and hepatitis B surface antibody.

## 2024-03-28 NOTE — Telephone Encounter (Signed)
 Pt was trying to schedule lab work for before her physical, but there are no orders in the system for it to be scheduled. can the orders be put in if its ok for her to get labs.

## 2024-04-01 ENCOUNTER — Other Ambulatory Visit: Payer: Self-pay | Admitting: Family Medicine

## 2024-04-02 ENCOUNTER — Other Ambulatory Visit: Payer: Self-pay

## 2024-04-02 ENCOUNTER — Telehealth: Payer: Self-pay | Admitting: Family Medicine

## 2024-04-02 ENCOUNTER — Telehealth: Payer: Self-pay

## 2024-04-02 DIAGNOSIS — Z0000 Encounter for general adult medical examination without abnormal findings: Secondary | ICD-10-CM

## 2024-04-02 DIAGNOSIS — Z1159 Encounter for screening for other viral diseases: Secondary | ICD-10-CM

## 2024-04-02 NOTE — Telephone Encounter (Signed)
 Pt came in wanting to talk with pcps cma. Pt wants cma to call her about FMLA paperwork. Pt stated she needs paper work for her FMLA she already has the paper work for her moms FMLA from Dr. Watt.

## 2024-04-02 NOTE — Telephone Encounter (Signed)
 Called pt Christine Griffith for her to call back to schedule lab and lab order placed.

## 2024-04-02 NOTE — Telephone Encounter (Signed)
 Copied from CRM [id number]. Topic: General - Other >> Apr 02, 2024  1:23 PM Nessti S wrote: Reason for CRM: pt called about FMLA forms. She was given the wrong docs. She would like a call back number [phone] >> Apr 02, 2024  1:25 PM Nessti S wrote: Call back after 4pm

## 2024-04-02 NOTE — Telephone Encounter (Signed)
 Called pt lvm letting her know it can take few weeks for the paperwork to show in the chart, when it sent to scan in the chart.

## 2024-04-04 NOTE — Telephone Encounter (Signed)
 Called pt advised paperwork was sent to scan. We will have to wait until its in the chart.

## 2024-05-14 ENCOUNTER — Ambulatory Visit: Payer: Self-pay

## 2024-05-14 ENCOUNTER — Telehealth: Payer: Self-pay | Admitting: Family Medicine

## 2024-05-14 NOTE — Telephone Encounter (Signed)
 FYI Only or Action Required?: FYI only for provider: appointment scheduled on 05/16/2024.  Patient was last seen in primary care on 03/13/2024 by Wheeler Harlene CROME, NP.  Called Nurse Triage reporting Abdominal Pain.  Symptoms began several days ago.  Interventions attempted: Other: muscle relaxer.  Triage Disposition: See PCP When Office is Open (Within 3 Days) (overriding See PCP Within 2 Weeks)  Patient/caregiver understands and will follow disposition?: Yes              Copied from CRM #[id number]. Topic: Clinical - Red Word Triage >> May 14, 2024  1:40 PM Rosina BIRCH wrote: Reason for CRM: pain on right side for three days Reason for Disposition  Abdominal pain is a chronic symptom (recurrent or ongoing AND present > 4 weeks)  Answer Assessment - Initial Assessment Questions This RN scheduled pt for first available appointment at PCP office. Pt not willing to go to a different office or UC. This RN educated pt on new-worsening symptoms and when to call back/seek emergent care. Pt verbalized understanding and agrees to plan.    Pain on right side of abdomen  Chronic issue, always there intermittent Pt states it has gotten worse after work; worsened x3 days 8/10 pain level intermittent Pt states she thinks it is muscle-related Pt has a pain management doctor- pt states she previously got a pain shot in her bottom from PCP Denies blood in bm, fever, chills, vomiting, abdomen pain Denies pain with touch Pain with movement  Protocols used: Abdominal Pain - [facility]

## 2024-05-14 NOTE — Telephone Encounter (Signed)
 Copied from CRM #[id number]. Topic: Clinical - Refused Triage >> May 14, 2024  2:05 PM Ashley R wrote: Patient/caller voiced complaints of side pain. Declined transfer to triage due to work. Unable to schedule appt and informed pt.  Pt states they will come into the office as soon as they can to be seen.    ----------------------------------------------------------------------- From previous Reason for Contact - Scheduling: Patient/patient representative is calling to schedule an appointment. Refer to attachments for appointment information.

## 2024-05-14 NOTE — Telephone Encounter (Signed)
 Will wait for Pt to call back to schedule .

## 2024-05-15 NOTE — Telephone Encounter (Signed)
 Appt scheduled

## 2024-05-16 ENCOUNTER — Ambulatory Visit: Admitting: Family

## 2024-05-19 ENCOUNTER — Ambulatory Visit: Admitting: Family Medicine

## 2024-06-04 ENCOUNTER — Encounter: Admitting: Family Medicine

## 2024-06-05 ENCOUNTER — Other Ambulatory Visit: Payer: Self-pay | Admitting: Family Medicine

## 2024-06-06 ENCOUNTER — Encounter: Admitting: Family Medicine

## 2024-06-10 ENCOUNTER — Encounter: Payer: Self-pay | Admitting: Family Medicine

## 2024-06-12 ENCOUNTER — Other Ambulatory Visit

## 2024-06-12 ENCOUNTER — Telehealth: Payer: Self-pay | Admitting: Family Medicine

## 2024-06-12 ENCOUNTER — Ambulatory Visit: Payer: Self-pay | Admitting: Family Medicine

## 2024-06-12 DIAGNOSIS — E559 Vitamin D deficiency, unspecified: Secondary | ICD-10-CM

## 2024-06-12 DIAGNOSIS — R7303 Prediabetes: Secondary | ICD-10-CM

## 2024-06-12 DIAGNOSIS — Z1159 Encounter for screening for other viral diseases: Secondary | ICD-10-CM

## 2024-06-12 DIAGNOSIS — E785 Hyperlipidemia, unspecified: Secondary | ICD-10-CM | POA: Diagnosis not present

## 2024-06-12 DIAGNOSIS — Z0000 Encounter for general adult medical examination without abnormal findings: Secondary | ICD-10-CM

## 2024-06-12 LAB — BASIC METABOLIC PANEL WITH GFR
BUN: 10 mg/dL (ref 6–23)
CO2: 29 meq/L (ref 19–32)
Calcium: 9.1 mg/dL (ref 8.4–10.5)
Chloride: 104 meq/L (ref 96–112)
Creatinine, Ser: 0.84 mg/dL (ref 0.40–1.20)
GFR: 77.08 mL/min
Glucose, Bld: 83 mg/dL (ref 70–99)
Potassium: 4.2 meq/L (ref 3.5–5.1)
Sodium: 140 meq/L (ref 135–145)

## 2024-06-12 LAB — CBC WITH DIFFERENTIAL/PLATELET
Basophils Absolute: 0 K/uL (ref 0.0–0.1)
Basophils Relative: 0.7 % (ref 0.0–3.0)
Eosinophils Absolute: 0.4 K/uL (ref 0.0–0.7)
Eosinophils Relative: 6.3 % — ABNORMAL HIGH (ref 0.0–5.0)
HCT: 38.2 % (ref 36.0–46.0)
Hemoglobin: 12.4 g/dL (ref 12.0–15.0)
Lymphocytes Relative: 37 % (ref 12.0–46.0)
Lymphs Abs: 2.4 K/uL (ref 0.7–4.0)
MCHC: 32.4 g/dL (ref 30.0–36.0)
MCV: 88 fl (ref 78.0–100.0)
Monocytes Absolute: 0.6 K/uL (ref 0.1–1.0)
Monocytes Relative: 8.8 % (ref 3.0–12.0)
Neutro Abs: 3 K/uL (ref 1.4–7.7)
Neutrophils Relative %: 47.2 % (ref 43.0–77.0)
Platelets: 391 K/uL (ref 150.0–400.0)
RBC: 4.34 Mil/uL (ref 3.87–5.11)
RDW: 13.7 % (ref 11.5–15.5)
WBC: 6.4 K/uL (ref 4.0–10.5)

## 2024-06-12 LAB — LIPID PANEL
Cholesterol: 196 mg/dL (ref 28–200)
HDL: 52.5 mg/dL
LDL Cholesterol: 130 mg/dL — ABNORMAL HIGH (ref 10–99)
NonHDL: 143.01
Total CHOL/HDL Ratio: 4
Triglycerides: 64 mg/dL (ref 10.0–149.0)
VLDL: 12.8 mg/dL (ref 0.0–40.0)

## 2024-06-12 LAB — COMPREHENSIVE METABOLIC PANEL WITH GFR
ALT: 15 U/L (ref 3–35)
AST: 14 U/L (ref 5–37)
Albumin: 4.3 g/dL (ref 3.5–5.2)
Alkaline Phosphatase: 85 U/L (ref 39–117)
BUN: 10 mg/dL (ref 6–23)
CO2: 29 meq/L (ref 19–32)
Calcium: 9.1 mg/dL (ref 8.4–10.5)
Chloride: 104 meq/L (ref 96–112)
Creatinine, Ser: 0.84 mg/dL (ref 0.40–1.20)
GFR: 77.08 mL/min
Glucose, Bld: 83 mg/dL (ref 70–99)
Potassium: 4.2 meq/L (ref 3.5–5.1)
Sodium: 140 meq/L (ref 135–145)
Total Bilirubin: 0.7 mg/dL (ref 0.2–1.2)
Total Protein: 7.1 g/dL (ref 6.0–8.3)

## 2024-06-12 LAB — VITAMIN D 25 HYDROXY (VIT D DEFICIENCY, FRACTURES): VITD: 42.13 ng/mL (ref 30.00–100.00)

## 2024-06-12 LAB — HEPATITIS B SURFACE ANTIBODY, QUANTITATIVE: Hep B S AB Quant (Post): 25 m[IU]/mL

## 2024-06-12 NOTE — Telephone Encounter (Signed)
 Pt dropped off papers to be reviewed by pcp. Placed papers in pcps box.

## 2024-06-12 NOTE — Patient Instructions (Signed)
 Christine Griffith

## 2024-06-13 ENCOUNTER — Encounter: Payer: Self-pay | Admitting: Family Medicine

## 2024-06-13 ENCOUNTER — Ambulatory Visit: Payer: Self-pay | Admitting: Family Medicine

## 2024-06-13 ENCOUNTER — Other Ambulatory Visit: Payer: Self-pay | Admitting: *Deleted

## 2024-06-13 ENCOUNTER — Ambulatory Visit

## 2024-06-13 ENCOUNTER — Ambulatory Visit: Admitting: Family Medicine

## 2024-06-13 VITALS — BP 128/78 | HR 75 | Temp 98.0°F | Resp 16 | Ht 62.0 in | Wt 158.8 lb

## 2024-06-13 DIAGNOSIS — G8929 Other chronic pain: Secondary | ICD-10-CM | POA: Diagnosis not present

## 2024-06-13 DIAGNOSIS — Z23 Encounter for immunization: Secondary | ICD-10-CM

## 2024-06-13 DIAGNOSIS — E785 Hyperlipidemia, unspecified: Secondary | ICD-10-CM | POA: Diagnosis not present

## 2024-06-13 DIAGNOSIS — R7303 Prediabetes: Secondary | ICD-10-CM

## 2024-06-13 DIAGNOSIS — M545 Low back pain, unspecified: Secondary | ICD-10-CM

## 2024-06-13 DIAGNOSIS — Z0000 Encounter for general adult medical examination without abnormal findings: Secondary | ICD-10-CM | POA: Diagnosis not present

## 2024-06-13 LAB — HEMOGLOBIN A1C: Hgb A1c MFr Bld: 5.9 % (ref 4.6–6.5)

## 2024-06-13 MED ORDER — PRAVASTATIN SODIUM 10 MG PO TABS: 10.0000 mg | ORAL_TABLET | Freq: Every day | ORAL | 2 refills | Status: AC

## 2024-06-13 NOTE — Patient Instructions (Addendum)
 Keep the diet clean and stay active.  If you do not hear anything about your referral in the next 1-2 weeks, call our office and ask for an update.  Please get me a copy of your advanced directive form at your convenience.   Go back to high-fiber bread.   Foods that may reduce pain: 1) Ginger 2) Blueberries 3) Salmon 4) Pumpkin seeds 5) Dark chocolate 6) Turmeric 7) Tart cherries 8) Virgin olive oil 9) Chili peppers 10) Mint 11) Krill oil  Let us  know if you need anything.

## 2024-06-13 NOTE — Telephone Encounter (Signed)
 Paperwork will be placed in provider bin for review.

## 2024-06-13 NOTE — Telephone Encounter (Signed)
 Are we able to add on A1C?

## 2024-06-13 NOTE — Progress Notes (Signed)
 Chief Complaint  Patient presents with   Annual Exam    CPE     Well Woman Christine Griffith is here for a complete physical.   Her last physical was >1 year ago.  Current diet: in general, a healthy diet. Current exercise: floor exercises, stretching. Weight is stable and she confirms fatigue out of ordinary. No LMP recorded. Patient has had a hysterectomy. Seatbelt? Yes Advanced directive? Yes  Health Maintenance Pap/HPV- N/A Mammogram- Yes Colon cancer screening-Yes Shingrix- Yes Tetanus- Yes Hep C screening- Yes HIV screening- Yes  Past Medical History:  Diagnosis Date   Chicken pox    Environmental allergies    GERD (gastroesophageal reflux disease)    Glaucoma (increased eye pressure)    Borderline   Seasonal allergies    Vitamin D  deficiency      Past Surgical History:  Procedure Laterality Date   ABDOMINAL HYSTERECTOMY  2014   BREAST BIOPSY Right    stereo   CESAREAN SECTION     WISDOM TOOTH EXTRACTION      Medications  Medications Ordered Prior to Encounter[1]   Allergies Allergies[2]  Review of Systems: Constitutional:  no unexpected weight changes Eye:  no recent significant change in vision Ear/Nose/Mouth/Throat:  Ears:  no recent change in hearing Nose/Mouth/Throat:  no complaints of nasal congestion, no sore throat Cardiovascular: no chest pain Respiratory:  no shortness of breath Gastrointestinal:  no abdominal pain, no change in bowel habits GU:  Female: negative for dysuria or pelvic pain Musculoskeletal/Extremities:  no new pain of the joints Integumentary (Skin/Breast):  no abnormal skin lesions reported Neurologic:  no headaches Endocrine:  denies fatigue  Exam BP 128/78 (BP Location: Left Arm, Patient Position: Sitting)   Pulse 75   Temp 98 F (36.7 C) (Oral)   Resp 16   Ht 5' 2 (1.575 m)   Wt 158 lb 12.8 oz (72 kg)   SpO2 98%   BMI 29.04 kg/m  General:  well developed, well nourished, in no apparent distress Skin:  no  significant moles, warts, or growths Head:  no masses, lesions, or tenderness Eyes:  pupils equal and round, sclera anicteric without injection Ears:  canals without lesions, TMs shiny without retraction, no obvious effusion, no erythema Nose:  nares patent, mucosa normal, and no drainage  Throat/Pharynx:  lips and gingiva without lesion; tongue and uvula midline; non-inflamed pharynx; no exudates or postnasal drainage Neck: neck supple without adenopathy, thyromegaly, or masses Lungs:  clear to auscultation, breath sounds equal bilaterally, no respiratory distress Cardio:  regular rate and rhythm, no LE edema Abdomen:  abdomen soft, nontender; bowel sounds normal; no masses or organomegaly Genital: Defer to GYN Musculoskeletal: TTP over right erector spinae muscle group; symmetrical muscle groups noted without atrophy or deformity Extremities:  no clubbing, cyanosis, or edema, no deformities, no skin discoloration Neuro:  gait normal; deep tendon reflexes normal and symmetric Psych: well oriented with normal range of affect and appropriate judgment/insight  Assessment and Plan  Well adult exam  Hyperlipidemia, unspecified hyperlipidemia type - Plan: pravastatin  (PRAVACHOL ) 10 MG tablet  Chronic right-sided low back pain without sciatica - Plan: Ambulatory referral to Orthopedic Surgery   Well 59 y.o. female. Counseled on diet and exercise. Advanced directive form requested today. PCV20 today.  Will get another opinion with the orthospine team. Will see if we can add an A1c to the sample she gave us  yesterday. Other orders as above. Follow up in 6 mo. The patient voiced understanding and agreement  to the plan.  Christine Mt Mendon, DO 06/13/2024 9:43 AM     [1]  Current Outpatient Medications on File Prior to Visit  Medication Sig Dispense Refill   amitriptyline  (ELAVIL ) 10 MG tablet Take 1 tablet (10 mg total) by mouth at bedtime. 30 tablet 1   calcium  carbonate (TUMS -  DOSED IN MG ELEMENTAL CALCIUM ) 500 MG chewable tablet Chew 1 tablet by mouth daily.     clobetasol  cream (TEMOVATE ) 0.05 % Apply 1 application topically 2 (two) times daily. 30 g 0   ezetimibe  (ZETIA ) 10 MG tablet Take 1 tablet by mouth once daily 30 tablet 0   fish oil-omega-3 fatty acids 1000 MG capsule Take 1 g by mouth every other day.     fluticasone  (FLONASE ) 50 MCG/ACT nasal spray Place 2 sprays into both nostrils daily. 16 g 1   gabapentin  (NEURONTIN ) 100 MG capsule Take 1 capsule (100 mg total) by mouth 3 (three) times daily. 90 capsule 2   loratadine  (CLARITIN ) 10 MG tablet TAKE 1 TABLET BY MOUTH ONCE DAILY AS NEEDED 45 tablet 0   Multiple Vitamin (MULTIVITAMIN) tablet Daily prn     omeprazole  (PRILOSEC) 20 MG capsule Take 1 capsule (20 mg total) by mouth daily. 90 capsule 1   ondansetron  (ZOFRAN -ODT) 4 MG disintegrating tablet Take 1 tablet (4 mg total) by mouth every 8 (eight) hours as needed for nausea or vomiting. 20 tablet 0   Vitamin D , Ergocalciferol , (DRISDOL ) 1.25 MG (50000 UNIT) CAPS capsule Take 1 capsule by mouth once a week 13 capsule 0   No current facility-administered medications on file prior to visit.  [2]  Allergies Allergen Reactions   Codeine Nausea And Vomiting   Prednisone  Nausea And Vomiting   Hydrocodone Nausea And Vomiting   Statins Rash    Myalgias

## 2024-06-25 ENCOUNTER — Telehealth: Payer: Self-pay | Admitting: Family Medicine

## 2024-06-25 NOTE — Telephone Encounter (Signed)
 Copied from CRM #[id number]. Topic: General - Other >> Jun 25, 2024  9:29 AM Zy'onna H wrote: Reason for CRM: Patient stated she sent over some forms on her most recent X-ray. The patient has an appointment with Orthopedics this afternoon and wants to ensure Wendling,MD will be able to provide the Orthopedic team with her images and forms for the appt.

## 2024-06-25 NOTE — Telephone Encounter (Signed)
 Hopefully this was done?

## 2024-12-19 ENCOUNTER — Ambulatory Visit: Admitting: Family Medicine
